# Patient Record
Sex: Male | Born: 1970 | Race: White | Hispanic: No | Smoking: Former smoker
Health system: Southern US, Community
[De-identification: ages and names within clinical notes are randomized; demographics above are authoritative.]

## PROBLEM LIST (undated history)

## (undated) DIAGNOSIS — I10 Essential (primary) hypertension: Secondary | ICD-10-CM

## (undated) DIAGNOSIS — Z8249 Family history of ischemic heart disease and other diseases of the circulatory system: Secondary | ICD-10-CM

## (undated) DIAGNOSIS — K219 Gastro-esophageal reflux disease without esophagitis: Secondary | ICD-10-CM

## (undated) DIAGNOSIS — I2510 Atherosclerotic heart disease of native coronary artery without angina pectoris: Secondary | ICD-10-CM

## (undated) DIAGNOSIS — E785 Hyperlipidemia, unspecified: Secondary | ICD-10-CM

## (undated) DIAGNOSIS — G4730 Sleep apnea, unspecified: Secondary | ICD-10-CM

## (undated) DIAGNOSIS — R7301 Impaired fasting glucose: Secondary | ICD-10-CM

## (undated) DIAGNOSIS — E782 Mixed hyperlipidemia: Secondary | ICD-10-CM

## (undated) DIAGNOSIS — B351 Tinea unguium: Secondary | ICD-10-CM

## (undated) DIAGNOSIS — F1911 Other psychoactive substance abuse, in remission: Secondary | ICD-10-CM

## (undated) DIAGNOSIS — N529 Male erectile dysfunction, unspecified: Secondary | ICD-10-CM

## (undated) HISTORY — DX: Mixed hyperlipidemia: E78.2

## (undated) HISTORY — DX: Male erectile dysfunction, unspecified: N52.9

## (undated) HISTORY — DX: Family history of ischemic heart disease and other diseases of the circulatory system: Z82.49

## (undated) HISTORY — DX: Sleep apnea, unspecified: G47.30

## (undated) HISTORY — DX: Gastro-esophageal reflux disease without esophagitis: K21.9

## (undated) HISTORY — DX: Impaired fasting glucose: R73.01

## (undated) HISTORY — DX: Essential (primary) hypertension: I10

## (undated) HISTORY — DX: Tinea unguium: B35.1

## (undated) HISTORY — DX: Hyperlipidemia, unspecified: E78.5

## (undated) HISTORY — PX: RHINOPLASTY: SUR1284

## (undated) HISTORY — DX: Atherosclerotic heart disease of native coronary artery without angina pectoris: I25.10

---

## 2003-03-29 DIAGNOSIS — I10 Essential (primary) hypertension: Secondary | ICD-10-CM

## 2003-03-29 HISTORY — DX: Essential (primary) hypertension: I10

## 2003-08-11 ENCOUNTER — Encounter: Admission: RE | Admit: 2003-08-11 | Discharge: 2003-08-11 | Payer: Self-pay | Admitting: General Practice

## 2003-11-11 ENCOUNTER — Ambulatory Visit (HOSPITAL_COMMUNITY): Admission: RE | Admit: 2003-11-11 | Discharge: 2003-11-11 | Payer: Self-pay | Admitting: Otolaryngology

## 2003-11-11 ENCOUNTER — Ambulatory Visit (HOSPITAL_BASED_OUTPATIENT_CLINIC_OR_DEPARTMENT_OTHER): Admission: RE | Admit: 2003-11-11 | Discharge: 2003-11-11 | Payer: Self-pay | Admitting: Otolaryngology

## 2006-09-07 ENCOUNTER — Emergency Department (HOSPITAL_COMMUNITY): Admission: EM | Admit: 2006-09-07 | Discharge: 2006-09-07 | Payer: Self-pay | Admitting: Emergency Medicine

## 2010-08-13 NOTE — Op Note (Signed)
NAME:  Donald Oliver, Donald Oliver                          ACCOUNT NO.:  [id number]   MEDICAL RECORD NO.:  [id number]                   PATIENT TYPE:  AMB   LOCATION:  [facility]                                 FACILITY:  [facility]   PHYSICIAN:  Joanna Puff, M.D.            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   POSTOPERATIVE DIAGNOSES:  Deviated septum and nasal pyramid deformities,  both secondary to trauma.   OPERATIONS:  1. Nasal septoplasty.  2. Open reduction of nasal fracture.   SURGEON:  Joanna Puff, M.D.   ANESTHESIA:  General endotracheal by Mr. Andrey Campanile, CRNA.   PROCEDURE:  Patient was brought to the operating room and placed in a supine  position.  After adequate general endotracheal anesthesia had been obtained,  the patient's head was draped in the usual manner.  Supplemental anesthesia  was given to the nose, blocking and infiltrating the nose with 1% Xylocaine  with Adrenaline and a topical application of 4% cocaine solution  intranasally.   After adequate time for the local to work well, bilateral intercartilaginous  incisions were made, and the upper lateral cartilages were exposed.  A small  returning edge of the upper lateral cartilage was trimmed.  The skin was  further undermined over the dorsum of the nose.  A rim incision was made,  and the lower lateral cartilage delivered.  Slight fullness of the lower  lateral cartilage was corrected with complete strip technique.  A button end  knife was then inserted through the intercartilaginous incision and brought  over the caudle end of the nose making a complete transfixion incision.  Bilateral mucoperichondrial and mucoperiosteal flaps were elevated.  Dissection was carried out through both sides of the cartilaginous and bony  septum.  This revealed a markedly deviated septum into the right airway,  consistent with both bony and cartilaginous deformity.  The cartilaginous  septum which had buckled off the spine and crest into the right airway was  then removed by means of sharp dissection.  A large bony spur of the  perpendicular plate of the ethmoid and vomer, was then removed by chisel and  rongeurs.  A widening of the nasal crest was also corrected with a 4-mm  chisel.  Out fracture of each inferior turbinate was carried out.  Attention  was then directed to the external pyramid.  The patient had a small fullness  along the dorsum, which had resulted from this trauma.  This consisted of  both osseous growth as well as cartilaginous fullness.  This corrected by  first using a bone rasp followed by a cartilage rasp to create more evenness  to the dorsum.  To realign the nose, a vestibular incision was made, and  lateral osteotomies were performed with guarded chisels.  Infracture of the  nasal bone was then carried out.  Inspection then revealed the septum and  pyramid to be in good alignment.  The transfixion incision was closed with a  base suture of 3-0 plain gut as well as interrupted sutures of 5-0 plain cat  gut.  The rim incisions were closed with interrupted sutures of 5-0 plain  cat gut.  Crushed cartilages were then placed over the dorsum of the nose.  An incision was made on the right side of the columella.  A pocket was  created, and a cartilaginous strut was placed in this area.  This incision  was closed with 5-0 plain catgut.  Intranasal splints were inserted and  sutured in place with 3-0 Ethilon.  An external splint was applied.  The  patient was then awakened, extubated, and returned to the recovery room for  reactivity prior to being discharged home.  He is scheduled to the office in  24 hours for routine followup.   Discharge medications include Cephalexin 250 1 q.i.d., Sterapred 5 mg dose   pack, and Mepergan Fortis 1 tablet q.3-4h. p.r.n. pain.   CONDITION ON DISCHARGE:  Good.   COMPLICATIONS:  None.                                               Joanna Puff, M.D.    LLP/MEDQ  D:  11/11/2003  T:  11/11/2003  Job:  [id number]

## 2012-07-02 ENCOUNTER — Emergency Department (HOSPITAL_COMMUNITY)
Admission: EM | Admit: 2012-07-02 | Discharge: 2012-07-02 | Disposition: A | Discharge status: Home / Self Care | Payer: Self-pay | Source: Home / Self Care | Attending: Family Medicine | Admitting: Family Medicine

## 2012-07-02 ENCOUNTER — Encounter (HOSPITAL_COMMUNITY): Payer: Self-pay

## 2012-07-02 DIAGNOSIS — F1290 Cannabis use, unspecified, uncomplicated: Secondary | ICD-10-CM

## 2012-07-02 DIAGNOSIS — I10 Essential (primary) hypertension: Secondary | ICD-10-CM

## 2012-07-02 DIAGNOSIS — F1521 Other stimulant dependence, in remission: Secondary | ICD-10-CM | POA: Insufficient documentation

## 2012-07-02 DIAGNOSIS — E782 Mixed hyperlipidemia: Secondary | ICD-10-CM | POA: Insufficient documentation

## 2012-07-02 DIAGNOSIS — F17200 Nicotine dependence, unspecified, uncomplicated: Secondary | ICD-10-CM | POA: Insufficient documentation

## 2012-07-02 DIAGNOSIS — E781 Pure hyperglyceridemia: Secondary | ICD-10-CM

## 2012-07-02 DIAGNOSIS — F1911 Other psychoactive substance abuse, in remission: Secondary | ICD-10-CM

## 2012-07-02 HISTORY — DX: Family history of ischemic heart disease and other diseases of the circulatory system: Z82.49

## 2012-07-02 HISTORY — DX: Essential (primary) hypertension: I10

## 2012-07-02 HISTORY — DX: Other psychoactive substance abuse, in remission: F19.11

## 2012-07-02 LAB — CBC
HCT: 46.2 % (ref 39.0–52.0)
Hemoglobin: 17.3 g/dL — ABNORMAL HIGH (ref 13.0–17.0)
MCH: 31.1 pg (ref 26.0–34.0)
MCHC: 37.4 g/dL — ABNORMAL HIGH (ref 30.0–36.0)
RDW: 13.3 % (ref 11.5–15.5)
WBC: 11.2 10*3/uL — ABNORMAL HIGH (ref 4.0–10.5)

## 2012-07-02 LAB — POCT I-STAT, CHEM 8
BUN: 10 mg/dL (ref 6–23)
Chloride: 104 mEq/L (ref 96–112)
Creatinine, Ser: 0.8 mg/dL (ref 0.50–1.35)
Glucose, Bld: 98 mg/dL (ref 70–99)
HCT: 51 % (ref 39.0–52.0)
Potassium: 3.8 mEq/L (ref 3.5–5.1)
Sodium: 140 mEq/L (ref 135–145)

## 2012-07-02 LAB — COMPREHENSIVE METABOLIC PANEL
ALT: 52 U/L (ref 0–53)
Albumin: 4.5 g/dL (ref 3.5–5.2)
Alkaline Phosphatase: 80 U/L (ref 39–117)
GFR calc non Af Amer: >90 mL/min (ref >90)
Glucose, Bld: 96 mg/dL (ref 70–99)
Total Bilirubin: 0.8 mg/dL (ref 0.3–1.2)
Total Protein: 8 g/dL (ref 6.0–8.3)

## 2012-07-02 LAB — LIPID PANEL: LDL Cholesterol: UNDETERMINED mg/dL (ref 0–99)

## 2012-07-02 MED ORDER — LISINOPRIL-HYDROCHLOROTHIAZIDE 10-12.5 MG PO TABS: 1.0000 | ORAL_TABLET | Freq: Every day | ORAL | Status: DC

## 2012-07-02 NOTE — ED Notes (Signed)
Patient is here for hypertension. 

## 2012-07-02 NOTE — ED Notes (Signed)
Patient states is having SOB Ringing in his ears Blurred vision

## 2012-07-02 NOTE — ED Provider Notes (Signed)
History    CSN: [id number]  Arrival date & time 07/02/12  1150   First MD Initiated Contact with Patient 07/02/12 1349     Chief Complaint  Patient presents with  . Hypertension   HPI Pt is reporting blurry vision, ringing in ears, headaches, shortness of breath and reports that he has been out of his insurance for many years.  He does not have a PCP.   Pt is a smoker.    History reviewed. No pertinent past medical history.  History reviewed. No pertinent past surgical history.  No family history on file.  History  Substance Use Topics  . Smoking status: Current everyday smoker   . Smokeless tobacco: Not on file  . Alcohol Use: Not on file     Review of Systems Constitutional: Negative.  HENT: Negative.  Respiratory: Negative.  Cardiovascular: Negative.  Gastrointestinal: Negative.  Endocrine: Negative.  Genitourinary: Negative.  Musculoskeletal: Negative.  Skin: Negative.  Allergic/Immunologic: Negative.  Neurological: Negative.  Hematological: Negative.  Psychiatric/Behavioral: Negative.  All other systems reviewed and are negative   Allergies  Amoxicillin  Home Medications  No current outpatient prescriptions on file.  BP 186/130  Pulse 91  Temp(Src) 98.1 F (36.7 C) (Oral)  SpO2 100%  Physical Exam Nursing note and vitals reviewed.  Constitutional: He is oriented to person, place, and time. He appears well-developed and well-nourished. No distress.  Eyes: Conjunctivae and EOM are normal. Pupils are equal, round, and reactive to light.  Neck: Normal range of motion. Neck supple. No JVD present. No thyromegaly present.  Cardiovascular: Normal rate, regular rhythm and normal heart sounds.  No murmur heard.  Pulmonary/Chest: Effort normal and breath sounds normal. No respiratory distress.  Abdominal: Soft. Bowel sounds are normal.  Musculoskeletal: Normal range of motion. He exhibits no edema.  Lymphadenopathy:  He has no cervical adenopathy.   Neurological: He is oriented to person, place, and time. Coordination normal.  Skin: Skin is warm and dry. No rash noted. No erythema. No pallor.  Psychiatric: He has a normal mood and affect. His behavior is normal. Judgment and thought content normal.   ED Course  Procedures (including critical care time)  Labs Reviewed - No data to display No results found.  No diagnosis found.  MDM  IMPRESSION  Hypertension, uncontrolled  Hypertriglyceridemia  History of amphetamine abuse  Current Marijuana use  RECOMMENDATIONS / PLAN Check labs today Start zestoretic 20/12.5 - take 1 po daily I STRONGLY RECOMMEND THAT YOU STOP SMOKING AND USING ALL TOBACCO / NICOTINE PRODUCTS.  If YOU ARE READY TO QUIT SMOKING PLEASE CALL smoking cessation specialist  [phone] The patient was counseled on the dangers of tobacco use, and was advised to quit.  Reviewed strategies to maximize success, including removing cigarettes and smoking materials from environment and stress management. Encouraged marijuana cessation  FOLLOW UP 2 weeks   The patient was given clear instructions to go to ER or return to medical center if symptoms don't improve, worsen or new problems develop.  The patient verbalized understanding.  The patient was told to call to get lab results if they haven't heard anything in the next week.             Cleora Fleet, MD 07/02/12 1921

## 2012-07-03 ENCOUNTER — Telehealth (HOSPITAL_COMMUNITY): Payer: Self-pay

## 2012-07-03 LAB — VITAMIN D 25 HYDROXY (VIT D DEFICIENCY, FRACTURES): Vit D, 25-Hydroxy: 51 ng/mL (ref 30–89)

## 2012-07-03 NOTE — ED Notes (Signed)
Spoke with patient lab results given To return tomorrow for fasting lipid blood draw

## 2012-07-03 NOTE — Progress Notes (Signed)
Quick Note:  Please notify patient that his labs reveal that his white blood cell count is a little elevated. His cholesterol and triglycerides are elevated. I'd like to check a fasting lipid panel before we start medications. Please have him come it to do a fasting lipid panel at his earliest convenience.   Rodney Langton, MD, CDE, FAAFP [facility] [place], [place]   ______

## 2012-07-04 ENCOUNTER — Emergency Department (INDEPENDENT_AMBULATORY_CARE_PROVIDER_SITE_OTHER)
Admission: EM | Admit: 2012-07-04 | Discharge: 2012-07-04 | Disposition: A | Discharge status: Home / Self Care | Payer: Self-pay | Source: Home / Self Care

## 2012-07-04 DIAGNOSIS — E781 Pure hyperglyceridemia: Secondary | ICD-10-CM

## 2012-07-04 DIAGNOSIS — I10 Essential (primary) hypertension: Secondary | ICD-10-CM

## 2012-07-04 LAB — LIPID PANEL
Cholesterol: 255 mg/dL — ABNORMAL HIGH (ref 0–200)
LDL Cholesterol: 145 mg/dL — ABNORMAL HIGH (ref 0–99)
Total CHOL/HDL Ratio: 7.1 RATIO
VLDL: 74 mg/dL — ABNORMAL HIGH (ref 0–40)

## 2012-07-04 NOTE — ED Notes (Signed)
Patient here for repeat fasting lipid panel

## 2012-07-05 NOTE — Progress Notes (Signed)
Quick Note:  Please inform patient that his cholesterol improved a little bit but is still very elevated. I think that he needs to start cholesterol medication. Please call and pravastatin 40 mg 1 by mouth daily taken at night before bed. Dispense #30, refill x3. I like to recheck his cholesterol and other labs in about 3 months.  Rodney Langton, MD, CDE, FAAFP [facility] [place], [place]   ______

## 2012-07-06 ENCOUNTER — Telehealth (HOSPITAL_COMMUNITY): Payer: Self-pay

## 2012-07-06 NOTE — ED Notes (Signed)
Patient returned our call Lab results given Pravastatin 40mg  was called into [facility] @ [place] [phone]

## 2012-07-16 ENCOUNTER — Encounter (HOSPITAL_COMMUNITY): Payer: Self-pay | Admitting: *Deleted

## 2012-07-16 ENCOUNTER — Emergency Department (HOSPITAL_COMMUNITY)
Admission: EM | Admit: 2012-07-16 | Discharge: 2012-07-16 | Disposition: A | Discharge status: Home / Self Care | Payer: Self-pay | Source: Home / Self Care

## 2012-07-16 DIAGNOSIS — F17200 Nicotine dependence, unspecified, uncomplicated: Secondary | ICD-10-CM

## 2012-07-16 DIAGNOSIS — I10 Essential (primary) hypertension: Secondary | ICD-10-CM

## 2012-07-16 DIAGNOSIS — E781 Pure hyperglyceridemia: Secondary | ICD-10-CM

## 2012-07-16 MED ORDER — LISINOPRIL-HYDROCHLOROTHIAZIDE 10-12.5 MG PO TABS: 2.0000 | ORAL_TABLET | Freq: Every day | ORAL | Status: DC

## 2012-07-16 NOTE — ED Notes (Signed)
Follow up with blood pressure.

## 2012-07-16 NOTE — ED Provider Notes (Signed)
History     CSN: [id number]  Arrival date & time 07/16/12  1030   First MD Initiated Contact with Patient 07/16/12 1121      Chief Complaint  Patient presents with  . Follow-up     HPI  42 year old male with active smoking, uncontrolled hypertension and significant dyslipidemia on recent labs was prescribed with antihypertensive and statin presents for a followup. On his previous visit he had symptoms of bloody vision and headaches which he denies at this time. He hasn't started taking his blood pressure medications on a regular basis and has been compliant. He also informs cutting down on smoking to three fourths of a pack from one pack per day. He still drinks 3 cups of coffee daily and has not been compliant with his diet.  Past Medical History  Diagnosis Date  . FH: premature coronary heart disease     Father had MI at age 40   . Hypertension   . History of substance abuse     Pt had abused amphetamines : quit in 2005  . Smoker     Past Surgical History  Procedure Laterality Date  . Rhinoplasty      2006 done at [facility] History  Problem Relation Age of Onset  . Heart failure Father     History  Substance Use Topics  . Smoking status: Current Every Day Smoker  . Smokeless tobacco: Never Used  . Alcohol Use: No      Review of Systems  Allergies  Amoxicillin  Home Medications   Current Outpatient Rx  Name  Route  Sig  Dispense  Refill  . lisinopril-hydrochlorothiazide (ZESTORETIC) 10-12.5 MG per tablet   Oral   Take 1 tablet by mouth daily.   30 tablet   2     BP 146/111  Pulse 91  Temp(Src) 98.4 F (36.9 C) (Oral)  Resp 18  SpO2 99%  Physical Exam Middle aged man in no acute distress HEENT: No pallor, moist oral mucosa Chest: Clear to auscultation bilaterally CVS: Normal S1 and S2 Abdomen: Soft, nontender, bowel sounds present, any wart over the infrapubic area, nontender, Extremities: Warm, no edema CNS: AAOX 3  ED  Course  Procedures (including critical care time)  Labs Reviewed - No data to display No results found.   No diagnosis found.  Assessment/ Plan Uncontrolled hypertension Blood Pressure still elevated at 140 /111 mmHg. Patient has started taking his blood pressure medications and slightly better than his previous meetings. I would increase his lisinopril-HCTZ to 2 times a day. I have advised him monitoring his blood pressure medications possibly every other day. He needs to followup in the clinic in one week to have his blood pressure evaluated. Counseled strongly on smoking cessation, diet control and regular exercise to reduce weight. Counseled on reducing intake of caffeinated beverages, and canned/ processed foods with high sodium and cholesterol content.  Dyslipidemia  Started on statin which has been tolerating well. Counseled on balanced healthy diet intake and avoiding fatty foods.     MDM   Followup in one week for blood pressure monitoring.        Eddie North, MD 07/16/12 1147

## 2012-07-16 NOTE — ED Notes (Signed)
Patient presents for follow-up of hypertension.

## 2012-07-24 ENCOUNTER — Encounter (HOSPITAL_COMMUNITY): Payer: Self-pay

## 2012-07-24 ENCOUNTER — Emergency Department (INDEPENDENT_AMBULATORY_CARE_PROVIDER_SITE_OTHER)
Admission: EM | Admit: 2012-07-24 | Discharge: 2012-07-24 | Disposition: A | Discharge status: Home Health | Payer: Self-pay | Source: Home / Self Care

## 2012-07-24 DIAGNOSIS — I10 Essential (primary) hypertension: Secondary | ICD-10-CM

## 2012-07-24 DIAGNOSIS — E781 Pure hyperglyceridemia: Secondary | ICD-10-CM

## 2012-07-24 MED ORDER — ASPIRIN 81 MG PO TBEC: 81.0000 mg | DELAYED_RELEASE_TABLET | Freq: Every day | ORAL | Status: DC

## 2012-07-24 MED ORDER — LISINOPRIL-HYDROCHLOROTHIAZIDE 20-25 MG PO TABS: 1.0000 | ORAL_TABLET | Freq: Every day | ORAL | Status: DC

## 2012-07-24 MED ORDER — NICOTINE 21 MG/24HR TD PT24: 1.0000 | MEDICATED_PATCH | TRANSDERMAL | Status: DC

## 2012-07-24 NOTE — ED Notes (Signed)
Patient has a history of HTN Here for follow up

## 2012-07-24 NOTE — ED Provider Notes (Signed)
History     CSN: [id number]  Arrival date & time 07/24/12  1068  42 year old male with uncontrolled blood pressure, hypertriglyceridemia, nicotine dependence, who presents for a followup of blood pressure. The patient states that his blood pressure at home has been in the 140s systolic. He has cut down on smoking. He is also trying to lose weight. And exercise more. He has been taking a baby aspirin for at least 3 or 4 years.    Chief Complaint  Patient presents with  . Hypertension      HPI  Past Medical History  Diagnosis Date  . FH: premature coronary heart disease     Father had MI at age 30   . Hypertension   . History of substance abuse     Pt had abused amphetamines : quit in 2005  . Smoker     Past Surgical History  Procedure Laterality Date  . Rhinoplasty      2006 done at [facility] History  Problem Relation Age of Onset  . Heart failure Father     History  Substance Use Topics  . Smoking status: Current Every Day Smoker  . Smokeless tobacco: Never Used  . Alcohol Use: No      Review of Systems As in history of present illness  Allergies  Amoxicillin  Home Medications   Current Outpatient Rx  Name  Route  Sig  Dispense  Refill  . pravastatin (PRAVACHOL) 20 MG tablet   Oral   Take 20 mg by mouth daily.         Marland Kitchen aspirin (ASPIRIN EC) 81 MG EC tablet   Oral   Take 1 tablet (81 mg total) by mouth daily. Swallow whole.   30 tablet   12   . lisinopril-hydrochlorothiazide (PRINZIDE,ZESTORETIC) 20-25 MG per tablet   Oral   Take 1 tablet by mouth daily.   30 tablet   3     BP 142/96  Pulse 95  Temp(Src) 97.7 F (36.5 C) (Oral)  Resp 16  SpO2 100%  Physical Exam Middle aged man in no acute distress  HEENT: No pallor, moist oral mucosa  Chest: Clear to auscultation bilaterally  CVS: Normal S1 and S2  Abdomen: Soft, nontender, bowel sounds present, any wart over the infrapubic area, nontender,  Extremities: Warm, no  edema  CNS: AAOX 3   ED Course  Procedures (including critical care time)  Labs Reviewed - No data to display No results found.   1. Uncontrolled hypertension patient has requested change in his Zestoretic combination to a higher dose so that he can take it once a day   2. Hypertriglyceridemia he was recently started on pravastatin, we'll check a lipid panel in 2 months prior to his next appointment    Followup in 2 months   MDM           Richarda Overlie, MD 07/24/12 1147

## 2012-10-26 ENCOUNTER — Encounter: Payer: Self-pay | Admitting: Family Medicine

## 2012-10-26 ENCOUNTER — Ambulatory Visit: Payer: Self-pay | Attending: Family Medicine | Admitting: Family Medicine

## 2012-10-26 VITALS — BP 137/95 | HR 62 | Temp 97.9°F | Resp 16 | Wt 177.6 lb

## 2012-10-26 DIAGNOSIS — E785 Hyperlipidemia, unspecified: Secondary | ICD-10-CM

## 2012-10-26 DIAGNOSIS — S92919A Unspecified fracture of unspecified toe(s), initial encounter for closed fracture: Secondary | ICD-10-CM | POA: Insufficient documentation

## 2012-10-26 DIAGNOSIS — S92911A Unspecified fracture of right toe(s), initial encounter for closed fracture: Secondary | ICD-10-CM

## 2012-10-26 DIAGNOSIS — A630 Anogenital (venereal) warts: Secondary | ICD-10-CM | POA: Insufficient documentation

## 2012-10-26 DIAGNOSIS — I10 Essential (primary) hypertension: Secondary | ICD-10-CM | POA: Insufficient documentation

## 2012-10-26 LAB — COMPLETE METABOLIC PANEL WITH GFR
Albumin: 4.7 g/dL (ref 3.5–5.2)
Calcium: 9.4 mg/dL (ref 8.4–10.5)
Chloride: 102 mEq/L (ref 96–112)
GFR, Est African American: >89 mL/min
Glucose, Bld: 86 mg/dL (ref 70–99)
Sodium: 136 mEq/L (ref 135–145)
Total Bilirubin: 0.9 mg/dL (ref 0.3–1.2)

## 2012-10-26 LAB — LIPID PANEL: Cholesterol: 237 mg/dL — ABNORMAL HIGH (ref 0–200)

## 2012-10-26 MED ORDER — LISINOPRIL-HYDROCHLOROTHIAZIDE 20-25 MG PO TABS: 1.0000 | ORAL_TABLET | Freq: Every day | ORAL | Status: DC

## 2012-10-26 MED ORDER — LOVASTATIN 20 MG PO TABS: 20.0000 mg | ORAL_TABLET | Freq: Every day | ORAL | Status: DC

## 2012-10-26 NOTE — Progress Notes (Signed)
Patient ID: Donald Oliver, male   DOB: 10-Dec-1970, 42 y.o.   MRN: [id number]  CC:  Chief Complaint  Patient presents with  . Follow-up     HPI: Pt reports that he is doing well with his blood pressure medications.  Pt says that he is tolerating meds.  He has noticed a big improvement in his blood pressure.  Pt reports that he has had pain in a 4th toe on right foot.  Pt also reports that he is concerned about genital wart lesion that has gotten bigger over past year that he would like to have treated.     Allergies  Allergen Reactions  . Amoxicillin    Past Medical History  Diagnosis Date  . FH: premature coronary heart disease     Father had MI at age 31   . Hypertension   . History of substance abuse     Pt had abused amphetamines : quit in 2005  . Smoker    Current Outpatient Prescriptions on File Prior to Visit  Medication Sig Dispense Refill  . aspirin (ASPIRIN EC) 81 MG EC tablet Take 1 tablet (81 mg total) by mouth daily. Swallow whole.  30 tablet  12  . lisinopril-hydrochlorothiazide (PRINZIDE,ZESTORETIC) 20-25 MG per tablet Take 1 tablet by mouth daily.  30 tablet  3  . nicotine (NICODERM CQ - DOSED IN MG/24 HOURS) 21 mg/24hr patch Place 1 patch onto the skin daily.  28 patch  0  . pravastatin (PRAVACHOL) 20 MG tablet Take 20 mg by mouth daily.       No current facility-administered medications on file prior to visit.   Family History  Problem Relation Age of Onset  . Heart failure Father    History   Social History  . Marital Status: Legally Separated    Spouse Name: N/A    Number of Children: 3  . Years of Education: 12    Occupational History  . Industrial/product designer     unemployed    Social History Main Topics  . Smoking status: Current Every Day Smoker  . Smokeless tobacco: Never Used  . Alcohol Use: No  . Drug Use: 1.00 per week    Special: Marijuana  . Sexually Active: Yes   Other Topics Concern  . Not on file   Social History Narrative   . No narrative on file    Review of Systems  Constitutional: Negative for fever, chills, diaphoresis, activity change, appetite change and fatigue.  HENT: Negative for ear pain, nosebleeds, congestion, facial swelling, rhinorrhea, neck pain, neck stiffness and ear discharge.   Eyes: Negative for pain, discharge, redness, itching and visual disturbance.  Respiratory: Negative for cough, choking, chest tightness, shortness of breath, wheezing and stridor.   Cardiovascular: Negative for chest pain, palpitations and leg swelling.  Gastrointestinal: Negative for abdominal distention.  Genitourinary: Negative for dysuria, urgency, frequency, hematuria, flank pain, decreased urine volume, difficulty urinating and dyspareunia.  Musculoskeletal: Negative for back pain, joint swelling, arthralgias and gait problem.  Neurological: Negative for dizziness, tremors, seizures, syncope, facial asymmetry, speech difficulty, weakness, light-headedness, numbness and headaches.  Hematological: Negative for adenopathy. Does not bruise/bleed easily.  Psychiatric/Behavioral: Negative for hallucinations, behavioral problems, confusion, dysphoric mood, decreased concentration and agitation.    Objective:   Filed Vitals:   10/26/12 1114  BP: 137/95  Pulse: 62  Temp: 97.9 F (36.6 C)  Resp: 16    Physical Exam  Constitutional: Appears well-developed and well-nourished. No distress.  HENT: Normocephalic.  External right and left ear normal. Oropharynx is clear and moist.  Eyes: Conjunctivae and EOM are normal. PERRLA, no scleral icterus.  Neck: Normal ROM. Neck supple. No JVD. No tracheal deviation. No thyromegaly.  CVS: RRR, S1/S2 +, no murmurs, no gallops, no carotid bruit.  Pulmonary: Effort and breath sounds normal, no stridor, rhonchi, wheezes, rales.  Abdominal: Soft. BS +,  no distension, tenderness, rebound or guarding.  Musculoskeletal: Normal range of motion.right foot edema and pain on lateral  edge.  Lymphadenopathy: No lymphadenopathy noted, cervical, inguinal. Neuro: Alert. Normal reflexes, muscle tone coordination. No cranial nerve deficit. Skin: Skin is warm and dry. No rash noted. Not diaphoretic. No erythema. No pallor.  Psychiatric: Normal mood and affect. Behavior, judgment, thought content normal.   Lab Results  Component Value Date   WBC 11.2* 07/02/2012   HGB 17.3* 07/02/2012   HCT 51.0 07/02/2012   MCV 82.9 07/02/2012   PLT 350 07/02/2012   Lab Results  Component Value Date   CREATININE 0.80 07/02/2012   BUN 10 07/02/2012   NA 140 07/02/2012   K 3.8 07/02/2012   CL 104 07/02/2012   CO2 26 07/02/2012    No results found for this basename: HGBA1C   Lipid Panel     Component Value Date/Time   CHOL 255* 07/04/2012 1034   TRIG 372* 07/04/2012 1034   HDL 36* 07/04/2012 1034   CHOLHDL 7.1 07/04/2012 1034   VLDL 74* 07/04/2012 1034   LDLCALC 145* 07/04/2012 1034       Assessment and plan:   Patient Active Problem List   Diagnosis Date Noted  . Uncontrolled hypertension 07/02/2012  . Hypertriglyceridemia 07/02/2012  . Smoker 07/02/2012  . History of substance abuse 07/02/2012  . Marijuana smoker 07/02/2012  . History of amphetamine dependence/abuse 07/02/2012   Genital Warts   Family medicine skin clinic referral  Refill medications.   Xray right foot to rule out fracture   Cryotherapy applied to genital wart lesion with patient's permission: Pt tolerated the procedure well.  He was given counseling and instructions on what to expect from procedure and expected course of healing.  Pt may return to have lesion treated again.  The patient verbalized understanding.    The patient was counseled on the dangers of tobacco use, and was advised to quit.  Reviewed strategies to maximize success, including removing cigarettes and smoking materials from environment and stress management.  RTC in 4 months   The patient was given clear instructions to go to ER or return to medical  center if symptoms don't improve, worsen or new problems develop.  The patient verbalized understanding.  The patient was told to call to get any lab results if not heard anything in the next week.    Rodney Langton, MD, CDE, FAAFP [facility] [place], [place]   Results for orders placed in visit on 10/26/12  COMPLETE METABOLIC PANEL WITH GFR      Result Value Range   Sodium 136  135 - 145 mEq/L   Potassium 3.8  3.5 - 5.3 mEq/L   Chloride 102  96 - 112 mEq/L   CO2 25  19 - 32 mEq/L   Glucose, Bld 86  70 - 99 mg/dL   BUN 16  6 - 23 mg/dL   Creat 1.61  0.96 - 0.45 mg/dL   Total Bilirubin 0.9  0.3 - 1.2 mg/dL   Alkaline Phosphatase 63  39 - 117 U/L   AST 22  0 - 37 U/L   ALT 35  0 - 53 U/L   Total Protein 7.3  6.0 - 8.3 g/dL   Albumin 4.7  3.5 - 5.2 g/dL   Calcium 9.4  8.4 - 84.1 mg/dL   GFR, Est African American >89     GFR, Est Non African American >89    LIPID PANEL      Result Value Range   Cholesterol 237 (*) 0 - 200 mg/dL   Triglycerides 324 (*) <150 mg/dL   HDL 38 (*) >40 mg/dL   Total CHOL/HDL Ratio 6.2     VLDL NOT CALC  0 - 40 mg/dL   LDL Cholesterol

## 2012-10-26 NOTE — Patient Instructions (Addendum)
Hypertension As your heart beats, it forces blood through your arteries. This force is your blood pressure. If the pressure is too high, it is called hypertension (HTN) or high blood pressure. HTN is dangerous because you may have it and not know it. High blood pressure may mean that your heart has to work harder to pump blood. Your arteries may be narrow or stiff. The extra work puts you at risk for heart disease, stroke, and other problems.  Blood pressure consists of two numbers, a higher number over a lower, 110/72, for example. It is stated as "110 over 72." The ideal is below 120 for the top number (systolic) and under 80 for the bottom (diastolic). Write down your blood pressure today. You should pay close attention to your blood pressure if you have certain conditions such as:  Heart failure.  Prior heart attack.  Diabetes  Chronic kidney disease.  Prior stroke.  Multiple risk factors for heart disease. To see if you have HTN, your blood pressure should be measured while you are seated with your arm held at the level of the heart. It should be measured at least twice. A one-time elevated blood pressure reading (especially in the Emergency Department) does not mean that you need treatment. There may be conditions in which the blood pressure is different between your right and left arms. It is important to see your caregiver soon for a recheck. Most people have essential hypertension which means that there is not a specific cause. This type of high blood pressure may be lowered by changing lifestyle factors such as:  Stress.  Smoking.  Lack of exercise.  Excessive weight.  Drug/tobacco/alcohol use.  Eating less salt. Most people do not have symptoms from high blood pressure until it has caused damage to the body. Effective treatment can often prevent, delay or reduce that damage. TREATMENT  When a cause has been identified, treatment for high blood pressure is directed at the  cause. There are a large number of medications to treat HTN. These fall into several categories, and your caregiver will help you select the medicines that are best for you. Medications may have side effects. You should review side effects with your caregiver. If your blood pressure stays high after you have made lifestyle changes or started on medicines,   Your medication(s) may need to be changed.  Other problems may need to be addressed.  Be certain you understand your prescriptions, and know how and when to take your medicine.  Be sure to follow up with your caregiver within the time frame advised (usually within two weeks) to have your blood pressure rechecked and to review your medications.  If you are taking more than one medicine to lower your blood pressure, make sure you know how and at what times they should be taken. Taking two medicines at the same time can result in blood pressure that is too low. SEEK IMMEDIATE MEDICAL CARE IF:  You develop a severe headache, blurred or changing vision, or confusion.  You have unusual weakness or numbness, or a faint feeling.  You have severe chest or abdominal pain, vomiting, or breathing problems. MAKE SURE YOU:   Understand these instructions.  Will watch your condition.  Will get help right away if you are not doing well or get worse. Document Released: 03/14/2005 Document Revised: 06/06/2011 Document Reviewed: 11/02/2007 [facility] Patient Information 2014 [place]. Managing Your High Blood Pressure Blood pressure is a measurement of how forceful your blood is  pressing against the walls of the arteries. Arteries are muscular tubes within the circulatory system. Blood pressure does not stay the same. Blood pressure rises when you are active, excited, or nervous; and it lowers during sleep and relaxation. If the numbers measuring your blood pressure stay above normal most of the time, you are at risk for health problems. High blood  pressure (hypertension) is a long-term (chronic) condition in which blood pressure is elevated. A blood pressure reading is recorded as two numbers, such as 120 over 80 (or 120/80). The first, higher number is called the systolic pressure. It is a measure of the pressure in your arteries as the heart beats. The second, lower number is called the diastolic pressure. It is a measure of the pressure in your arteries as the heart relaxes between beats.  Keeping your blood pressure in a normal range is important to your overall health and prevention of health problems, such as heart disease and stroke. When your blood pressure is uncontrolled, your heart has to work harder than normal. High blood pressure is a very common condition in adults because blood pressure tends to rise with age. Men and women are equally likely to have hypertension but at different times in life. Before age 27, men are more likely to have hypertension. After 43 years of age, women are more likely to have it. Hypertension is especially common in African Americans. This condition often has no signs or symptoms. The cause of the condition is usually not known. Your caregiver can help you come up with a plan to keep your blood pressure in a normal, healthy range. BLOOD PRESSURE STAGES Blood pressure is classified into four stages: normal, prehypertension, stage 1, and stage 2. Your blood pressure reading will be used to determine what type of treatment, if any, is necessary. Appropriate treatment options are tied to these four stages:  Normal  Systolic pressure (mm Hg): below 120.  Diastolic pressure (mm Hg): below 80. Prehypertension  Systolic pressure (mm Hg): 120 to 139.  Diastolic pressure (mm Hg): 80 to 89. Stage1  Systolic pressure (mm Hg): 140 to 159.  Diastolic pressure (mm Hg): 90 to 99. Stage2  Systolic pressure (mm Hg): 160 or above.  Diastolic pressure (mm Hg): 100 or above. RISKS RELATED TO HIGH BLOOD  PRESSURE Managing your blood pressure is an important responsibility. Uncontrolled high blood pressure can lead to:  A heart attack.  A stroke.  A weakened blood vessel (aneurysm).  Heart failure.  Kidney damage.  Eye damage.  Metabolic syndrome.  Memory and concentration problems. HOW TO MANAGE YOUR BLOOD PRESSURE Blood pressure can be managed effectively with lifestyle changes and medicines (if needed). Your caregiver will help you come up with a plan to bring your blood pressure within a normal range. Your plan should include the following: Education  Read all information provided by your caregivers about how to control blood pressure.  Educate yourself on the latest guidelines and treatment recommendations. New research is always being done to further define the risks and treatments for high blood pressure. Lifestylechanges  Control your weight.  Avoid smoking.  Stay physically active.  Reduce the amount of salt in your diet.  Reduce stress.  Control any chronic conditions, such as high cholesterol or diabetes.  Reduce your alcohol intake. Medicines  Several medicines (antihypertensive medicines) are available, if needed, to bring blood pressure within a normal range. Communication  Review all the medicines you take with your caregiver because there may be  side effects or interactions.  Talk with your caregiver about your diet, exercise habits, and other lifestyle factors that may be contributing to high blood pressure.  See your caregiver regularly. Your caregiver can help you create and adjust your plan for managing high blood pressure. RECOMMENDATIONS FOR TREATMENT AND FOLLOW-UP  The following recommendations are based on current guidelines for managing high blood pressure in nonpregnant adults. Use these recommendations to identify the proper follow-up period or treatment option based on your blood pressure reading. You can discuss these options with your  caregiver.  Systolic pressure of 120 to 139 or diastolic pressure of 80 to 89: Follow up with your caregiver as directed.  Systolic pressure of 140 to 160 or diastolic pressure of 90 to 100: Follow up with your caregiver within 2 months.  Systolic pressure above 160 or diastolic pressure above 100: Follow up with your caregiver within 1 month.  Systolic pressure above 180 or diastolic pressure above 110: Consider antihypertensive therapy; follow up with your caregiver within 1 week.  Systolic pressure above 200 or diastolic pressure above 120: Begin antihypertensive therapy; follow up with your caregiver within 1 week. Document Released: 12/07/2011 Document Reviewed: 12/07/2011 [facility] Patient Information 2014 [place], [place]. Cryotherapy Cryotherapy is when you put ice on your injury. Ice helps lessen pain and puffiness (swelling) after an injury. Ice works the best when you start using it in the first 24 to 48 hours after an injury. HOME CARE  Put a dry or damp towel between the ice pack and your skin.  You may press gently on the ice pack.  Leave the ice on for no more than 10 to 20 minutes at a time.  Check your skin after 5 minutes to make sure your skin is okay.  Rest at least 20 minutes between ice pack uses.  Stop using ice when your skin loses feeling (numbness).  Do not use ice on someone who cannot tell you when it hurts. This includes small children and people with memory problems (dementia). GET HELP RIGHT AWAY IF:  You have white spots on your skin.  Your skin turns blue or pale.  Your skin feels waxy or hard.  Your puffiness gets worse. MAKE SURE YOU:   Understand these instructions.  Will watch your condition.  Will get help right away if you are not doing well or get worse. Document Released: 08/31/2007 Document Revised: 06/06/2011 Document Reviewed: 11/04/2010 [facility] Patient Information 2014 [place], [place].

## 2012-10-26 NOTE — Progress Notes (Signed)
Patient here for follow up on his B/P Has an issue with toe on right foot Has genital warts and would like something prescribed for that

## 2012-10-28 DIAGNOSIS — A630 Anogenital (venereal) warts: Secondary | ICD-10-CM | POA: Insufficient documentation

## 2012-10-28 NOTE — Progress Notes (Signed)
Quick Note:  Please inform patient that metabolic panel came back OK. His cholesterol numbers are improving but still elevated. We should check a fasting lipid panel in 3 months and may need to increase the dose of the cholesterol medication. Also, work on low fat low cholesterol diet and regular exercise at least 5x per week.  Rodney Langton, MD, CDE, FAAFP [facility] [place], [place]   ______

## 2012-10-29 ENCOUNTER — Telehealth: Payer: Self-pay | Admitting: *Deleted

## 2012-10-29 NOTE — Telephone Encounter (Signed)
10/29/12 Spoke with patient and made aware of lab results metabolic panel came back OK. Cholesterol improving . Recheck in 3 months  Patient will call back and schedule appointment also start low fat low cholesterol diet along with exercising. P.Fergie Sherbert,RN BSN [facility]

## 2013-02-25 ENCOUNTER — Ambulatory Visit: Payer: Self-pay

## 2013-02-27 ENCOUNTER — Ambulatory Visit: Payer: Self-pay | Attending: Internal Medicine | Admitting: Internal Medicine

## 2013-02-27 ENCOUNTER — Encounter: Payer: Self-pay | Admitting: Internal Medicine

## 2013-02-27 VITALS — BP 138/96 | HR 74 | Temp 98.6°F | Resp 14 | Ht 69.0 in | Wt 177.8 lb

## 2013-02-27 DIAGNOSIS — I10 Essential (primary) hypertension: Secondary | ICD-10-CM | POA: Insufficient documentation

## 2013-02-27 DIAGNOSIS — E781 Pure hyperglyceridemia: Secondary | ICD-10-CM | POA: Insufficient documentation

## 2013-02-27 DIAGNOSIS — F17200 Nicotine dependence, unspecified, uncomplicated: Secondary | ICD-10-CM | POA: Insufficient documentation

## 2013-02-27 LAB — COMPLETE METABOLIC PANEL WITH GFR
Albumin: 4.5 g/dL (ref 3.5–5.2)
Alkaline Phosphatase: 58 U/L (ref 39–117)
CO2: 26 mEq/L (ref 19–32)
Chloride: 102 mEq/L (ref 96–112)
GFR, Est Non African American: >89 mL/min
Glucose, Bld: 103 mg/dL — ABNORMAL HIGH (ref 70–99)
Total Protein: 7.2 g/dL (ref 6.0–8.3)

## 2013-02-27 LAB — LIPID PANEL
Cholesterol: 243 mg/dL — ABNORMAL HIGH (ref 0–200)
LDL Cholesterol: 155 mg/dL — ABNORMAL HIGH (ref 0–99)
Total CHOL/HDL Ratio: 5.3 Ratio
Triglycerides: 211 mg/dL — ABNORMAL HIGH (ref <150)
VLDL: 42 mg/dL — ABNORMAL HIGH (ref 0–40)

## 2013-02-27 LAB — TSH: TSH: 1.366 u[IU]/mL (ref 0.350–4.500)

## 2013-02-27 MED ORDER — VARENICLINE TARTRATE 1 MG PO TABS: 1.0000 mg | ORAL_TABLET | Freq: Two times a day (BID) | ORAL | Status: AC

## 2013-02-27 MED ORDER — GEMFIBROZIL 600 MG PO TABS: 600.0000 mg | ORAL_TABLET | Freq: Two times a day (BID) | ORAL | Status: DC

## 2013-02-27 NOTE — Progress Notes (Signed)
Patient ID: Donald Oliver, male   DOB: 05-03-1970, 42 y.o.   MRN: [id number]   CC:  HPI: 42 year old male with a history of hypertension, hypertriglyceridemia, nicotine dependence who's here for a blood pressure check and to receive refills on his anti-hypertensive medication. Unfortunately he continues to smoke, he also has a positive family history of father will have sudden cardiac death at the age of 68.  He denies any shortness of breath but does complain of intermittent chest pain which is chronic apparently he has had it for 3 or 4 years it is always under his rib cage bilaterally. He feels that this is musculoskeletal pain. No associated symptoms of dizziness.  He has never had a stress test.  He states that he is in between jobs and is anticipated to receive health insurance soon      Allergies  Allergen Reactions  . Amoxicillin    Past Medical History  Diagnosis Date  . FH: premature coronary heart disease     Father had MI at age 32   . Hypertension   . History of substance abuse     Pt had abused amphetamines : quit in 2005  . Smoker    Current Outpatient Prescriptions on File Prior to Visit  Medication Sig Dispense Refill  . aspirin (ASPIRIN EC) 81 MG EC tablet Take 1 tablet (81 mg total) by mouth daily. Swallow whole.  30 tablet  12  . lisinopril-hydrochlorothiazide (PRINZIDE,ZESTORETIC) 20-25 MG per tablet Take 1 tablet by mouth daily.  30 tablet  4  . lovastatin (MEVACOR) 20 MG tablet Take 1 tablet (20 mg total) by mouth at bedtime.  30 tablet  3  . nicotine (NICODERM CQ - DOSED IN MG/24 HOURS) 21 mg/24hr patch Place 1 patch onto the skin daily.  28 patch  0   No current facility-administered medications on file prior to visit.   Family History  Problem Relation Age of Onset  . Heart failure Father    History   Social History  . Marital Status: Legally Separated    Spouse Name: N/A    Number of Children: 3  . Years of Education: 12    Occupational  History  . Industrial/product designer     unemployed    Social History Main Topics  . Smoking status: Current Every Day Smoker  . Smokeless tobacco: Never Used  . Alcohol Use: No  . Drug Use: 1.00 per week    Special: Marijuana  . Sexual Activity: Yes   Other Topics Concern  . Not on file   Social History Narrative  . No narrative on file    Review of Systems  Constitutional: Negative for fever, chills, diaphoresis, activity change, appetite change and fatigue.  HENT: Negative for ear pain, nosebleeds, congestion, facial swelling, rhinorrhea, neck pain, neck stiffness and ear discharge.   Eyes: Negative for pain, discharge, redness, itching and visual disturbance.  Respiratory: Negative for cough, choking, chest tightness, shortness of breath, wheezing and stridor.   Cardiovascular: Negative for chest pain, palpitations and leg swelling.  Gastrointestinal: Negative for abdominal distention.  Genitourinary: Negative for dysuria, urgency, frequency, hematuria, flank pain, decreased urine volume, difficulty urinating and dyspareunia.  Musculoskeletal: Negative for back pain, joint swelling, arthralgias and gait problem.  Neurological: Negative for dizziness, tremors, seizures, syncope, facial asymmetry, speech difficulty, weakness, light-headedness, numbness and headaches.  Hematological: Negative for adenopathy. Does not bruise/bleed easily.  Psychiatric/Behavioral: Negative for hallucinations, behavioral problems, confusion, dysphoric mood, decreased concentration and agitation.  Objective:   Filed Vitals:   02/27/13 0908  BP: 138/96  Pulse: 74  Temp: 98.6 F (37 C)  Resp: 14    Physical Exam  Constitutional: Appears well-developed and well-nourished. No distress.  HENT: Normocephalic. External right and left ear normal. Oropharynx is clear and moist.  Eyes: Conjunctivae and EOM are normal. PERRLA, no scleral icterus.  Neck: Normal ROM. Neck supple. No JVD. No  tracheal deviation. No thyromegaly.  CVS: RRR, S1/S2 +, no murmurs, no gallops, no carotid bruit.  Pulmonary: Effort and breath sounds normal, no stridor, rhonchi, wheezes, rales.  Abdominal: Soft. BS +,  no distension, tenderness, rebound or guarding.  Musculoskeletal: Normal range of motion. No edema and no tenderness.  Lymphadenopathy: No lymphadenopathy noted, cervical, inguinal. Neuro: Alert. Normal reflexes, muscle tone coordination. No cranial nerve deficit. Skin: Skin is warm and dry. No rash noted. Not diaphoretic. No erythema. No pallor.  Psychiatric: Normal mood and affect. Behavior, judgment, thought content normal.   Lab Results  Component Value Date   WBC 11.2* 07/02/2012   HGB 17.3* 07/02/2012   HCT 51.0 07/02/2012   MCV 82.9 07/02/2012   PLT 350 07/02/2012   Lab Results  Component Value Date   CREATININE 0.78 10/26/2012   BUN 16 10/26/2012   NA 136 10/26/2012   K 3.8 10/26/2012   CL 102 10/26/2012   CO2 25 10/26/2012    No results found for this basename: HGBA1C   Lipid Panel     Component Value Date/Time   CHOL 237* 10/26/2012 1146   TRIG 422* 10/26/2012 1146   HDL 38* 10/26/2012 1146   CHOLHDL 6.2 10/26/2012 1146   VLDL NOT CALC 10/26/2012 1146   LDLCALC Comment:   Not calculated due to Triglyceride >400. Suggest ordering Direct LDL (Unit Code: 21308).   Total Cholesterol/HDL Ratio:CHD Risk                        Coronary Heart Disease Risk Table                                        Men       Women          1/2 Average Risk              3.4        3.3              Average Risk              5.0        4.4           2X Average Risk              9.6        7.1           3X Average Risk             23.4       11.0 Use the calculated Patient Ratio above and the CHD Risk table  to determine the patient's CHD Risk. ATP III Classification (LDL):       < 100        mg/dL         Optimal      657 - 129     mg/dL         Near or Above  Optimal      130 - 159     mg/dL         Borderline High      160 -  189     mg/dL         High       > 409        mg/dL         Very High   10/26/1912 1146       Assessment and plan:   Patient Active Problem List   Diagnosis Date Noted  . Genital warts 10/28/2012  . Toe fracture 10/26/2012  . Unspecified essential hypertension 10/26/2012  . Dyslipidemia 10/26/2012  . Uncontrolled hypertension 07/02/2012  . Hypertriglyceridemia 07/02/2012  . Smoker 07/02/2012  . History of substance abuse 07/02/2012  . Marijuana smoker 07/02/2012  . 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 The patient verbalized understanding. The patient was told to call to get any lab results if not heard anything in the next week.

## 2013-02-27 NOTE — Progress Notes (Signed)
Pt is here for a f/u for hypertension check and medication refill. BP is 138/96.

## 2013-02-28 LAB — URINALYSIS
Hgb urine dipstick: NEGATIVE
Leukocytes, UA: NEGATIVE
Nitrite: NEGATIVE
Protein, ur: NEGATIVE mg/dL

## 2013-03-15 ENCOUNTER — Telehealth: Payer: Self-pay | Admitting: Internal Medicine

## 2013-03-15 NOTE — Telephone Encounter (Signed)
Pt called to get refills on scripts lovastatin (MEVACOR) 20 MG tablet & lisinopril-hydrochlorothiazide (PRINZIDE,ZESTORETIC) 20-25 MG per tablet; Please refill scripts to [facility] on [address]

## 2013-03-25 ENCOUNTER — Other Ambulatory Visit: Payer: Self-pay | Admitting: Family Medicine

## 2013-04-17 NOTE — Telephone Encounter (Signed)
Please refill, thank you!

## 2013-04-19 MED ORDER — LISINOPRIL-HYDROCHLOROTHIAZIDE 20-25 MG PO TABS: 1.0000 | ORAL_TABLET | Freq: Every day | ORAL | Status: DC

## 2013-04-19 MED ORDER — LOVASTATIN 20 MG PO TABS: 20.0000 mg | ORAL_TABLET | Freq: Every day | ORAL | Status: DC

## 2013-04-19 NOTE — Telephone Encounter (Signed)
Pt aware to pick scripts up at [facility]

## 2013-04-30 ENCOUNTER — Ambulatory Visit: Payer: Self-pay | Admitting: Internal Medicine

## 2013-12-31 ENCOUNTER — Ambulatory Visit (INDEPENDENT_AMBULATORY_CARE_PROVIDER_SITE_OTHER): Payer: BC Managed Care – PPO | Admitting: Medical

## 2013-12-31 ENCOUNTER — Encounter: Payer: Self-pay | Admitting: Medical

## 2013-12-31 VITALS — BP 120/82 | HR 84 | Temp 98.3°F | Resp 16 | Ht 68.0 in | Wt 185.0 lb

## 2013-12-31 DIAGNOSIS — Z8249 Family history of ischemic heart disease and other diseases of the circulatory system: Secondary | ICD-10-CM

## 2013-12-31 DIAGNOSIS — N521 Erectile dysfunction due to diseases classified elsewhere: Secondary | ICD-10-CM

## 2013-12-31 DIAGNOSIS — R7301 Impaired fasting glucose: Secondary | ICD-10-CM

## 2013-12-31 DIAGNOSIS — I10 Essential (primary) hypertension: Secondary | ICD-10-CM

## 2013-12-31 DIAGNOSIS — Z720 Tobacco use: Secondary | ICD-10-CM

## 2013-12-31 DIAGNOSIS — F17200 Nicotine dependence, unspecified, uncomplicated: Secondary | ICD-10-CM

## 2013-12-31 LAB — COMPREHENSIVE METABOLIC PANEL
ALBUMIN: 4.5 g/dL (ref 3.5–5.2)
ALT: 44 U/L (ref 0–53)
AST: 25 U/L (ref 0–37)
Alkaline Phosphatase: 72 U/L (ref 39–117)
BUN: 14 mg/dL (ref 6–23)
CALCIUM: 9.3 mg/dL (ref 8.4–10.5)
CHLORIDE: 102 meq/L (ref 96–112)
CO2: 27 meq/L (ref 19–32)
CREATININE: 0.85 mg/dL (ref 0.50–1.35)
Glucose, Bld: 98 mg/dL (ref 70–99)
POTASSIUM: 3.8 meq/L (ref 3.5–5.3)
SODIUM: 139 meq/L (ref 135–145)
TOTAL PROTEIN: 7.2 g/dL (ref 6.0–8.3)
Total Bilirubin: 0.8 mg/dL (ref 0.2–1.2)

## 2013-12-31 LAB — CBC
HEMATOCRIT: 44.8 % (ref 39.0–52.0)
HEMOGLOBIN: 16 g/dL (ref 13.0–17.0)
MCH: 30.8 pg (ref 26.0–34.0)
MCHC: 35.7 g/dL (ref 30.0–36.0)
MCV: 86.3 fL (ref 78.0–100.0)
Platelets: 347 10*3/uL (ref 150–400)
RBC: 5.19 MIL/uL (ref 4.22–5.81)
RDW: 13.5 % (ref 11.5–15.5)
WBC: 10.3 10*3/uL (ref 4.0–10.5)

## 2013-12-31 NOTE — Progress Notes (Signed)
Subjective:   Donald Oliver is a 43 y.o. male presenting on 12/31/2013 with Hypertension  Subjective: Here as a new patient today.  He has concerns about his BP.   Hx/o hypertension x 10 years or so.   BP fluctuates.   He checks BP at [facility] pharmacy periodically.  Numbers vary widely but usually elevated.   Recently got a reading of 180/120 at pharmacy.  Lately he notes intermittent variety of symptoms including spots in his vision, back pain, headaches, chest pains.  Does have hx/o GERD and certainly feels the pressure worse if he runs out of Nexium.   Gets chest pains/tightness/discomfort from left to center chest area.  Chest pains last most of the day.  Not worse with activity.  No SOB.  No palpitations.   No swelling in legs.  Eats more fast food that he should.   Vision changes - sometimes black squiggly lines, other times gets color spots in vision, resolves quickly.  No corrective lenses, no eye doctor visit.  He is a smoker, 1ppd x 25 years.  No prior cardiac testing.  He notes about a year hx/o erection problems, trouble getting and keeping erections.  No morning erections.  Married.    Family hx/o: Father diagnosed 55yo with MI, died 38yo from MI.  No other complaint.  Review of Systems ROS as in subjective      Objective:    BP 120/82  Pulse 84  Temp(Src) 98.3 F (36.8 C) (Oral)  Resp 16  Ht 5\' 8"  (1.727 m)  Wt 185 lb (83.915 kg)  BMI 28.14 kg/m2  General appearance: alert, no distress, WD/WN, white male Neck: supple, no lymphadenopathy, no thyromegaly, no masses, no bruits Heart: RRR, normal S1, S2, no murmurs Lungs: CTA bilaterally, no wheezes, rhonchi, or rales Abdomen: +bs, soft, non tender, non distended, no masses, no hepatomegaly, no splenomegaly GU: normal male genitalia, circumcised, testicles nontender, no mass, no swelling, no deformity, no lymphadenopathy Pulses: 2+ symmetric, upper and lower extremities, normal cap refill Ext: no edema   Adult ECG Report  Indication: chest pain, hypertension  Rate: 72bpm  Rhythm: normal sinus rhythm  QRS Axis: 65 degrees  PR Interval: 137ms  QRS Duration: 160ms  QTc: 462ms  Conduction Disturbances: none  Other Abnormalities: Q in III, aVF, T wave inversion III  Patient's cardiac risk factors are: family history of premature cardiovascular disease, hypertension, male gender and smoking/ tobacco exposure.  EKG comparison: none  Narrative Interpretation: possible infarct age undetermined       Assessment: Encounter Diagnoses  Name Primary?  . Essential hypertension Yes  . Smoker   . Impaired fasting glucose   . Erectile dysfunction due to diseases classified elsewhere   . Family history of premature CAD      Plan: discussed concerns, risks of uncontrolled BP, cardiac risk factors.   Referral to cardiology for baseline eval given 10 year hx/o HTN, smoker, recent collection of symptoms, and given his risk factors including premature CAD in first degree relative and ED symptoms.   Advised he get a cuff to take BPs regularly at home.  Discussed smoking cessation, healthy diet, exercise.  We will call with lab results.    [name] was seen today for hypertension.  Diagnoses and associated orders for this visit:  Essential hypertension - Hemoglobin A1c - Comprehensive metabolic panel - TSH - CBC - High sensitivity CRP - EKG 12-Lead - PR ELECTROCARDIOGRAM, COMPLETE  Smoker - Hemoglobin A1c - Comprehensive metabolic  panel - TSH - CBC - High sensitivity CRP - EKG 12-Lead - PR ELECTROCARDIOGRAM, COMPLETE  Impaired fasting glucose - Hemoglobin A1c - Comprehensive metabolic panel - TSH - CBC - High sensitivity CRP - EKG 12-Lead - PR ELECTROCARDIOGRAM, COMPLETE  Erectile dysfunction due to diseases classified elsewhere - Hemoglobin A1c - Comprehensive metabolic panel - TSH - CBC - High sensitivity CRP - EKG 12-Lead - PR ELECTROCARDIOGRAM, COMPLETE  Family history  of premature CAD - Hemoglobin A1c - Comprehensive metabolic panel - TSH - CBC - High sensitivity CRP - EKG 12-Lead - PR ELECTROCARDIOGRAM, COMPLETE    Return pending labs.

## 2014-01-01 LAB — HEMOGLOBIN A1C
HEMOGLOBIN A1C: 5.9 % — AB (ref <5.7)
MEAN PLASMA GLUCOSE: 123 mg/dL — AB (ref <117)

## 2014-01-01 LAB — TSH: TSH: 1.579 u[IU]/mL (ref 0.350–4.500)

## 2014-01-01 LAB — HIGH SENSITIVITY CRP: CRP, High Sensitivity: 12.2 mg/L — ABNORMAL HIGH

## 2014-01-23 ENCOUNTER — Encounter: Payer: Self-pay | Admitting: Cardiology

## 2014-01-23 DIAGNOSIS — Z8249 Family history of ischemic heart disease and other diseases of the circulatory system: Secondary | ICD-10-CM | POA: Insufficient documentation

## 2014-01-23 DIAGNOSIS — N529 Male erectile dysfunction, unspecified: Secondary | ICD-10-CM | POA: Insufficient documentation

## 2014-01-23 NOTE — Progress Notes (Signed)
Patient ID: Donald Oliver, male   DOB: [birth year], 43 y.o.   MRN: [id number]   Donald Oliver    Date of visit:  01/23/2014 DOB:  1970-09-23    Age:  43 yrs. Medical record number:  [id number]     Account number:  [id number] Primary Care Provider: Robyne Oliver ____________________________ CURRENT DIAGNOSES  1. Essential (primary) hypertension  2. Family history of ischemic heart disease and other diseases of the circulatory system  3. Toxic effect of tobacco cigarettes, undetermined, initial encounter  4. Overweight  5. Hyperlipidemia  6. Chest pain, unspecified ____________________________ ALLERGIES  Amoxicillin, Swelling (non-specific) ____________________________ MEDICATIONS  1. lisinopril 20 mg-hydrochlorothiazide 25 mg tablet, 1 p.o. daily  2. Nexium 20 mg capsule,delayed release, 1 p.o. daily  3. aspirin 81 mg chewable tablet, 1 p.o. daily ____________________________ CHIEF COMPLAINTS  Chest pain lasts all day ____________________________ HISTORY OF PRESENT ILLNESS This very nice 43 year old male is seen for evaluation of hypertension and cardiovascular risk assessment. I took care of his father beginning at age 44 and he ultimately died at age 44 of complications of coronary artery disease. He has a prior history of tobacco abuse for over 25 years and smokes about a pack of cigarettes per day. He was diagnosed with essential hypertension and states his blood pressure has been elevated at home and when he has it checked at the drug store although he tends to be normotensive at the doctor's office. He does fairly heavy physical labor without chest pain but occasionally has some atypical chest pain described as sharp pain to is not associated with activity. He has significant hyperlipidemia and at one time took Mevacor but stated that it made his legs hurt. His hyperlipidemia is currently untreated. He was seen recently and was found to have a high CRP and was sent here for evaluation of  hypertension and further cardiac risk assessment. He denies anginal pain and has no PND orthopnea, syncope, or palpitations. He does have a remote history of amphetamine abuse but has been substance free for about 10 years. He also complains of significant erectile dysfunction. He does not have the morning erections and has difficulty maintaining as well as getting an erection. ____________________________ PAST HISTORY  Past Medical Illnesses:  hypertension, GERD, erectile dysfunction;  Cardiovascular Illnesses:  no previous history of cardiac disease;  Infectious Diseases:  no previous history of significant infectious diseases;  Surgical Procedures:  deviated septum repair;  Trauma History:  no previous history of significant trauma;  NYHA Classification:  I;  Cardiology Procedures-Invasive:  no previous interventional or invasive cardiology procedures;  Cardiology Procedures-Noninvasive:  no previous non-invasive cardiovascular testing;  Peripheral Vascular Procedures:  no previous invasive peripheral vascular procedures.;  LVEF not documented,   ____________________________ CARDIO-PULMONARY TEST DATES EKG Date:  01/23/2014;   ____________________________ FAMILY HISTORY Father -- Father dead, Coronary artery bypass grafting, Hypercholesterolemia, Diabetes mellitus Mother -- Mother alive with problem, Hypertension Sister -- Sister dead, AIDS Sister -- Sister alive and well ____________________________ SOCIAL HISTORY Alcohol Use:  occasionally;  Smoking:  smokes cigarettes,, 25 pack year history;  Diet:  regular diet;  Lifestyle:  married, 1 daughter and 2 stepdaughters;  Exercise:  some exercise;  Occupation:  Oceanographer;  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  Integumentary:  no rashes or new skin lesions. Eyes:  denies diplopia, history of glaucoma or  visual problems.  Ears, Nose, Throat, Mouth:  denies any hearing loss, epistaxis, hoarseness or difficulty speaking.  Respiratory: denies dyspnea, cough, wheezing or hemoptysis. Cardiovascular:  please review HPI  Abdominal:  dyspepsia  Genitourinary-Male: erectile dysfunction  Musculoskeletal:  arthritis of the hands  Neurological:  denies headaches, stroke, or TIA  Psychiatric:  denies depession or anxiety  ____________________________ PHYSICAL EXAMINATION VITAL SIGNS  Blood Pressure:  128/74 Sitting, Right arm, regular cuff  , 120/80 Standing, Right arm and regular cuff   Pulse:  88/min. Weight:  186.00 lbs. Height:  69"BMI: 27  Constitutional:  pleasant white male in no acute distress, overweight Skin:  warm and dry to touch, no apparent skin lesions, or masses noted. Head:  normocephalic, normal hair pattern, no masses or tenderness Eyes:  EOMS Intact, PERRLA, C and S clear, Funduscopic exam not done. ENT:  ears, nose and throat reveal no gross abnormalities.  Dentition good. Neck:  supple, without massess. No JVD, thyromegaly or carotid bruits. Carotid upstroke normal. Chest:  normal symmetry, clear to auscultation. Cardiac:  regular rhythm, normal S1 and S2, No S3 or S4, no murmurs, gallops or rubs detected. Abdomen:  abdomen soft,non-tender, no masses, no hepatospenomegaly, or aneurysm noted Peripheral Pulses:  the femoral,dorsalis pedis, and posterior tibial pulses are full and equal bilaterally with no bruits auscultated. Extremities & Back:  no deformities, clubbing, cyanosis, erythema or edema observed. Normal muscle strength and tone. Neurological:  no gross motor or sensory deficits noted, affect appropriate, oriented x3. ____________________________ MOST RECENT LIPID PANEL 02/27/13  CHOL TOTL 243 mg/dl, LDL 155 NM, HDL 46 mg/dl and TRIGLYCER 211 mg/dl ____________________________ IMPRESSIONS/PLAN 1. Chest pain with atypical features 2. Hypertension currently controlled here  but elevated according to outside readings 3. Combined hyperlipidemia currently not treated 4. Smoking advised to stop 5. Significant family history of premature cardiac disease 6. Erectile dysfunction 7. Overweight  Recommendations:  His calculated 10 year risk of cardiac events is currently 10.6%. He has a significant family history of cardiac disease with multiple risk factors. I recommended that he have a cardiac calcium score that we start with a standard exercise treadmill test to evaluate for ischemia.  In terms of his cardiovascular risk factors I would recommend treatment of his hyperlipidemia based on his cardiac risk profile. We initiated treatment with Crestor 10 mg daily to see if he would tolerate this. We also discussed that he will obtain another blood pressure cuff and continue to monitor blood pressures at home. We discussed weight reduction and following a carbohydrate reduced Mediterranean type diet such as the [employer]. Following his treadmill test we can give an exercise prescription. We discussed smoking cessation as well as quitting a quit date and the use of nicotine replacement therapy. Thank you for asking me to see him with you. ____________________________ TODAYS ORDERS  1. treadmill:  Regular TM At At Patient Convenience  2. 12 Lead EKG: Today  3. Cardiac calcium score: at pat convenience                       ____________________________ Cardiology Physician:  Kerry Hough MD [facility]

## 2014-02-11 ENCOUNTER — Ambulatory Visit (INDEPENDENT_AMBULATORY_CARE_PROVIDER_SITE_OTHER): Payer: BC Managed Care – PPO | Admitting: Medical

## 2014-02-11 ENCOUNTER — Encounter: Payer: Self-pay | Admitting: Medical

## 2014-02-11 VITALS — BP 130/80 | HR 82 | Temp 98.1°F | Resp 16 | Wt 185.0 lb

## 2014-02-11 DIAGNOSIS — R0600 Dyspnea, unspecified: Secondary | ICD-10-CM

## 2014-02-11 DIAGNOSIS — N528 Other male erectile dysfunction: Secondary | ICD-10-CM

## 2014-02-11 DIAGNOSIS — J208 Acute bronchitis due to other specified organisms: Secondary | ICD-10-CM

## 2014-02-11 DIAGNOSIS — R05 Cough: Secondary | ICD-10-CM

## 2014-02-11 DIAGNOSIS — F17200 Nicotine dependence, unspecified, uncomplicated: Secondary | ICD-10-CM

## 2014-02-11 DIAGNOSIS — Z720 Tobacco use: Secondary | ICD-10-CM

## 2014-02-11 DIAGNOSIS — R059 Cough, unspecified: Secondary | ICD-10-CM

## 2014-02-11 MED ORDER — ALBUTEROL SULFATE HFA 108 (90 BASE) MCG/ACT IN AERS: 2.0000 | INHALATION_SPRAY | Freq: Four times a day (QID) | RESPIRATORY_TRACT | Status: DC | PRN

## 2014-02-11 MED ORDER — SILDENAFIL CITRATE 50 MG PO TABS: 50.0000 mg | ORAL_TABLET | Freq: Every day | ORAL | Status: DC | PRN

## 2014-02-11 MED ORDER — AZITHROMYCIN 250 MG PO TABS
ORAL_TABLET | ORAL | Status: DC
Start: 2014-02-11 — End: 2015-02-25

## 2014-02-11 NOTE — Progress Notes (Signed)
Subjective:  Donald Oliver is a 43 y.o. male who presents for dyspnea and cough.  Symptoms include over a week hx/o cough, nonproductive, dyspnea, pressure and fullness of ears, headache after cough, chest tightness and wheezing, runny nose, mild chills and body aches.   Denies sore throat, NVD, fever.  Treatment to date: cough suppressants.  Daughter - sick contacts with respiratory symptoms, was put on antibiotic.   He does smoke.   He does not have a history of asthma, COPD.   No prior inhaler use.  No other aggravating or relieving factors.    After his last visit here he was seen by cardiology, had stress test, reportedly things are normal. He notes that Dr. Wynonia Lawman gave him a few samples of 25 mg Viagra at his request, didn't see much improvement with erections with 25 mg.would like to try something stronger. Denies any side effects of the medication  No other c/o.  The following portions of the patient's history were reviewed and updated as appropriate: allergies, current medications, past family history, past medical history, past social history, past surgical history and problem list.  ROS as in subjective  Past Medical History  Diagnosis Date  . FH: premature coronary heart disease     Father had MI at age 74   . History of substance abuse     Pt had abused amphetamines : quit in 2005  . Smoker   . Hypertension 2005  . GERD (gastroesophageal reflux disease)   . Combined hyperlipidemia      Objective: BP 130/80 mmHg  Pulse 82  Temp(Src) 98.1 F (36.7 C) (Oral)  Resp 16  Wt 185 lb (83.915 kg)   General appearance: Alert, WD/WN, no distress,mildly ill appearing                             Skin: warm, no rash, no diaphoresis                           Head: maxillary sinus tenderness                            Eyes: conjunctiva normal, corneas clear, PERRLA                            Ears: pearly TMs, external ear canals normal                          Nose: septum midline,  turbinates swollen, with erythema and no discharge             Mouth/throat: MMM, tongue normal, mild pharyngeal erythema                           Neck: supple, no adenopathy, no thyromegaly, nontender                          Heart: RRR, normal S1, S2, no murmurs                         Lungs: +bronchial breath sounds, +scattered rhonchi, no wheezes, no rales                Extremities: no  edema, nontender     Assessment: Encounter Diagnoses  Name Primary?  . Acute bronchitis due to other specified organisms Yes  . Cough   . Dyspnea   . Tobacco use disorder   . Other male erectile dysfunction       Plan:  Reviewed CXR.  No obvious pneumonia no cardiomegaly no other obvious abnormality other than bronchial congestion suggested bronchitis  Medication orders today include: Zpak, albuterol inhaler, discussed proper use of inhaler.  Discussed diagnosis and treatment of bronchitis.  Suggested symptomatic OTC remedies for cough and congestion.  Tylenol or Ibuprofen OTC for fever and malaise.  Call/return in 2-3 days if symptoms are worse or not improving.  Advised that cough may linger even after the infection is improved.  Encouraged him to quit smoking.    Erectile Dysfunction - Reviewed pathophysiology and differential diagnosis of erectile dysfunction with the patient.  Discussed treatment options.  We will request the follow-up cardiology notes and stress test results.  Gave him samples of 50 mg Viagra to hold onto until we get cardiology notes.  Once we review the additional records and call him back then he will likely begin trial of Viagra.  Discussed potential risks of medications including hypotension and priapism.  Discussed proper use of medication.  Questions were answered.   F/u pending call back.

## 2014-02-12 ENCOUNTER — Telehealth: Payer: Self-pay | Admitting: Medical

## 2014-02-12 NOTE — Telephone Encounter (Signed)
I reviewed the cardiology notes.   He can go ahead and try the Viagra 50mg  dose.

## 2014-02-13 NOTE — Telephone Encounter (Signed)
Patient is aware of [facility] message in detail.

## 2014-02-17 ENCOUNTER — Encounter: Payer: Self-pay | Admitting: Medical

## 2014-03-28 DIAGNOSIS — R7301 Impaired fasting glucose: Secondary | ICD-10-CM

## 2014-03-28 HISTORY — DX: Impaired fasting glucose: R73.01

## 2014-05-05 ENCOUNTER — Encounter: Payer: Self-pay | Admitting: Medical

## 2014-11-19 ENCOUNTER — Other Ambulatory Visit: Payer: Self-pay | Admitting: Occupational Medicine

## 2014-11-19 ENCOUNTER — Ambulatory Visit: Payer: Self-pay

## 2014-11-19 DIAGNOSIS — M79671 Pain in right foot: Secondary | ICD-10-CM

## 2015-02-09 ENCOUNTER — Telehealth: Payer: Self-pay | Admitting: Medical

## 2015-02-09 ENCOUNTER — Other Ambulatory Visit: Payer: Self-pay | Admitting: Medical

## 2015-02-09 MED ORDER — LISINOPRIL-HYDROCHLOROTHIAZIDE 20-25 MG PO TABS: 1.0000 | ORAL_TABLET | Freq: Every day | ORAL | Status: DC

## 2015-02-09 NOTE — Telephone Encounter (Signed)
Pt made a cpe appt needs refills of bp meds until then. appt is 11/30 and pt uses [facility] on [address].

## 2015-02-25 ENCOUNTER — Encounter: Payer: Self-pay | Admitting: Medical

## 2015-02-25 ENCOUNTER — Telehealth: Payer: Self-pay | Admitting: Medical

## 2015-02-25 ENCOUNTER — Ambulatory Visit (INDEPENDENT_AMBULATORY_CARE_PROVIDER_SITE_OTHER): Payer: BLUE CROSS/BLUE SHIELD | Admitting: Medical

## 2015-02-25 VITALS — BP 120/90 | HR 71 | Ht 68.5 in | Wt 181.0 lb

## 2015-02-25 DIAGNOSIS — F17200 Nicotine dependence, unspecified, uncomplicated: Secondary | ICD-10-CM

## 2015-02-25 DIAGNOSIS — Z125 Encounter for screening for malignant neoplasm of prostate: Secondary | ICD-10-CM | POA: Insufficient documentation

## 2015-02-25 DIAGNOSIS — K219 Gastro-esophageal reflux disease without esophagitis: Secondary | ICD-10-CM | POA: Insufficient documentation

## 2015-02-25 DIAGNOSIS — H539 Unspecified visual disturbance: Secondary | ICD-10-CM

## 2015-02-25 DIAGNOSIS — I10 Essential (primary) hypertension: Secondary | ICD-10-CM | POA: Diagnosis not present

## 2015-02-25 DIAGNOSIS — B351 Tinea unguium: Secondary | ICD-10-CM | POA: Diagnosis not present

## 2015-02-25 DIAGNOSIS — F1521 Other stimulant dependence, in remission: Secondary | ICD-10-CM | POA: Diagnosis not present

## 2015-02-25 DIAGNOSIS — L989 Disorder of the skin and subcutaneous tissue, unspecified: Secondary | ICD-10-CM | POA: Diagnosis not present

## 2015-02-25 DIAGNOSIS — Z8249 Family history of ischemic heart disease and other diseases of the circulatory system: Secondary | ICD-10-CM | POA: Diagnosis not present

## 2015-02-25 DIAGNOSIS — N528 Other male erectile dysfunction: Secondary | ICD-10-CM | POA: Diagnosis not present

## 2015-02-25 DIAGNOSIS — Z0000 Encounter for general adult medical examination without abnormal findings: Secondary | ICD-10-CM | POA: Insufficient documentation

## 2015-02-25 DIAGNOSIS — Z23 Encounter for immunization: Secondary | ICD-10-CM | POA: Diagnosis not present

## 2015-02-25 DIAGNOSIS — Z720 Tobacco use: Secondary | ICD-10-CM

## 2015-02-25 DIAGNOSIS — E782 Mixed hyperlipidemia: Secondary | ICD-10-CM

## 2015-02-25 HISTORY — DX: Encounter for general adult medical examination without abnormal findings: Z00.00

## 2015-02-25 HISTORY — DX: Gastro-esophageal reflux disease without esophagitis: K21.9

## 2015-02-25 LAB — COMPREHENSIVE METABOLIC PANEL
ALBUMIN: 4.3 g/dL (ref 3.6–5.1)
ALK PHOS: 58 U/L (ref 40–115)
ALT: 35 U/L (ref 9–46)
AST: 24 U/L (ref 10–40)
BILIRUBIN TOTAL: 1.2 mg/dL (ref 0.2–1.2)
BUN: 12 mg/dL (ref 7–25)
CALCIUM: 9 mg/dL (ref 8.6–10.3)
CO2: 25 mmol/L (ref 20–31)
CREATININE: 0.7 mg/dL (ref 0.60–1.35)
Chloride: 101 mmol/L (ref 98–110)
Glucose, Bld: 75 mg/dL (ref 65–99)
Potassium: 3.9 mmol/L (ref 3.5–5.3)
SODIUM: 137 mmol/L (ref 135–146)
TOTAL PROTEIN: 7 g/dL (ref 6.1–8.1)

## 2015-02-25 LAB — CBC
HEMATOCRIT: 45.8 % (ref 39.0–52.0)
HEMOGLOBIN: 16.1 g/dL (ref 13.0–17.0)
MCH: 30.7 pg (ref 26.0–34.0)
MCHC: 35.2 g/dL (ref 30.0–36.0)
MCV: 87.4 fL (ref 78.0–100.0)
MPV: 10.2 fL (ref 8.6–12.4)
Platelets: 277 10*3/uL (ref 150–400)
RBC: 5.24 MIL/uL (ref 4.22–5.81)
RDW: 13.6 % (ref 11.5–15.5)
WBC: 10.3 10*3/uL (ref 4.0–10.5)

## 2015-02-25 LAB — POCT URINALYSIS DIPSTICK
Bilirubin, UA: NEGATIVE
Glucose, UA: NEGATIVE
Ketones, UA: NEGATIVE
Leukocytes, UA: NEGATIVE
Nitrite, UA: NEGATIVE
PROTEIN UA: NEGATIVE
RBC UA: NEGATIVE
SPEC GRAV UA: 1.015
UROBILINOGEN UA: NEGATIVE
pH, UA: 7

## 2015-02-25 LAB — LIPID PANEL
Cholesterol: 148 mg/dL (ref 125–200)
HDL: 37 mg/dL — ABNORMAL LOW (ref >=40)
LDL Cholesterol: 63 mg/dL (ref <130)
Total CHOL/HDL Ratio: 4 Ratio (ref <=5.0)
Triglycerides: 238 mg/dL — ABNORMAL HIGH (ref <150)
VLDL: 48 mg/dL — ABNORMAL HIGH (ref <30)

## 2015-02-25 LAB — HEMOGLOBIN A1C
HEMOGLOBIN A1C: 5.7 % — AB (ref <5.7)
Mean Plasma Glucose: 117 mg/dL — ABNORMAL HIGH (ref <117)

## 2015-02-25 MED ORDER — DEXLANSOPRAZOLE 60 MG PO CPDR: 60.0000 mg | DELAYED_RELEASE_CAPSULE | Freq: Every day | ORAL | Status: DC

## 2015-02-25 NOTE — Telephone Encounter (Signed)
Have him return for Tdap as I forgot to tell you about this.    See other message below about referrals.

## 2015-02-25 NOTE — Progress Notes (Signed)
Subjective:   HPI  Donald Oliver is a 44 y.o. male who presents for a complete physical.  Concerns: Thinks he has toenail fungus.  Has some thickened yellow nails, mainly right foot  Just saw Dr. Wynonia Lawman 2 weeks ago for routine f/u.   Had normal stress test last year.  compliant with medications for cholesterol, blood pressure.  Has been having some black spots in vision occasionally, no other vision changes. No prior eye doctor visit.  He notes GERD long term, has failed Nexium, zantac, Prilosec, and other acid blockers OTC.  Reviewed their medical, surgical, family, social, medication, and allergy history and updated chart as appropriate.  Past Medical History  Diagnosis Date  . FH: premature coronary heart disease     Father had MI at age 54   . History of substance abuse     Pt had abused amphetamines : quit in 2005  . Smoker   . Hypertension 2005  . GERD (gastroesophageal reflux disease)   . Combined hyperlipidemia   . Impaired fasting blood sugar 2016  . Onychomycosis   . Normal cardiac stress test 01/2014    Dr. Wynonia Lawman    Past Surgical History  Procedure Laterality Date  . Rhinoplasty      2006 done at [facility] [facility] History  . Marital Status: Legally Separated    Spouse Name: N/A  . Number of Children: 3  . Years of Education: 12    Occupational History  . Sports coach     unemployed    Social History Main Topics  . Smoking status: Current Every Day Smoker -- 1.00 packs/day for 25 years  . Smokeless tobacco: Never Used  . Alcohol Use: No  . Drug Use: 1.00 per week    Special: Marijuana  . Sexual Activity: Yes   Other Topics Concern  . Not on file   Social History Narrative   Married has 3 girls ages 12yo, 45yo, 45yo, works at [employer], walks several miles daily at work, hx/o substance abuse in the past    Family History  Problem Relation Age of Onset  . Heart failure Father   . Heart disease  Father 34    died of MI  . Diabetes Father   . HIV Sister      Current outpatient prescriptions:  .  aspirin (ASPIRIN EC) 81 MG EC tablet, Take 1 tablet (81 mg total) by mouth daily. Swallow whole., Disp: 30 tablet, Rfl: 12 .  esomeprazole (NEXIUM) 20 MG capsule, Take 20 mg by mouth daily at 12 noon., Disp: , Rfl:  .  lisinopril-hydrochlorothiazide (PRINZIDE,ZESTORETIC) 20-25 MG tablet, Take 1 tablet by mouth daily., Disp: 30 tablet, Rfl: 0 .  rosuvastatin (CRESTOR) 10 MG tablet, Take 10 mg by mouth daily., Disp: , Rfl:  .  albuterol (PROVENTIL HFA;VENTOLIN HFA) 108 (90 BASE) MCG/ACT inhaler, Inhale 2 puffs into the lungs every 6 (six) hours as needed for wheezing or shortness of breath. (Patient not taking: Reported on 02/25/2015), Disp: 1 Inhaler, Rfl: 0 .  azithromycin (ZITHROMAX) 250 MG tablet, 2 tablets day 1, then 1 tablet days 2-4 (Patient not taking: Reported on 02/25/2015), Disp: 6 tablet, Rfl: 0 .  sildenafil (VIAGRA) 50 MG tablet, Take 1 tablet (50 mg total) by mouth daily as needed for erectile dysfunction. (Patient not taking: Reported on 02/25/2015), Disp: 2 tablet, Rfl: 0  Allergies  Allergen Reactions  . Amoxicillin     Review  of Systems Constitutional: -fever, -chills, -sweats, -unexpected weight change, -decreased appetite, -fatigue Allergy: -sneezing, -itching, -congestion Dermatology: -changing moles, --rash, -lumps ENT: -runny nose, -ear pain, -sore throat, -hoarseness, -sinus pain, -teeth pain, - ringing in ears, -hearing loss, -nosebleeds Cardiology: -chest pain, -palpitations, -swelling, -difficulty breathing when lying flat, -waking up short of breath Respiratory: -cough, -shortness of breath, -difficulty breathing with exercise or exertion, -wheezing, -coughing up blood Gastroenterology: -abdominal pain, -nausea, -vomiting, -diarrhea, -constipation, -blood in stool, -changes in bowel movement, -difficulty swallowing or eating Hematology: -bleeding, -bruising   Musculoskeletal: -joint aches, -muscle aches, -joint swelling, -back pain, -neck pain, -cramping, -changes in gait Ophthalmology: denies vision changes, eye redness, itching, discharge Urology: -burning with urination, -difficulty urinating, -blood in urine, -urinary frequency, -urgency, -incontinence Neurology: -headache, -weakness, -tingling, -numbness, -memory loss, -falls, -dizziness Psychology: -depressed mood, -agitation, -sleep problems     Objective:   Physical Exam  BP 120/90 mmHg  Pulse 71  Ht 5' 8.5" (1.74 m)  Wt 181 lb (82.101 kg)  BMI 27.12 kg/m2  General appearance: alert, no distress, WD/WN, white male Skin: right great toe and 5th toenail thickened and yellow.  several skin tags of supraspinatus region bilat, several scattered macules, left upper back 59mm crusted round lesion along scapular area, striae of abdomen, right medial upper arm just proximal to elbow and right buttock with well circumscribed brown round 1cm lesion unchanged for years per patient, no other worrisome lesions HEENT: normocephalic, conjunctiva/corneas normal, sclerae anicteric, PERRLA, EOMi, nares patent, no discharge or erythema, pharynx normal Oral cavity: MMM, tongue normal, teeth with moderate stain, missing some molars on bottom Neck: supple, no lymphadenopathy, no thyromegaly, no masses, normal ROM, no bruits Chest: non tender, normal shape and expansion Heart: RRR, normal S1, S2, no murmurs Lungs: CTA bilaterally, no wheezes, rhonchi, or rales Abdomen: +bs, soft, mild epigastric tenderness, otherwise non tender, non distended, no masses, no hepatomegaly, no splenomegaly, no bruits Back: non tender, normal ROM, no scoliosis Musculoskeletal: upper extremities non tender, no obvious deformity, normal ROM throughout, lower extremities non tender, no obvious deformity, normal ROM throughout Extremities: no edema, no cyanosis, no clubbing Pulses: 2+ symmetric, upper and lower extremities, normal  cap refill Neurological: alert, oriented x 3, CN2-12 intact, strength normal upper extremities and lower extremities, sensation normal throughout, DTRs 2+ throughout, no cerebellar signs, gait normal Psychiatric: normal affect, behavior normal, pleasant  GU: normal male external genitalia, circumcised, nontender, no masses, no hernia, no lymphadenopathy Rectal: deferred   Assessment and Plan :    Encounter Diagnoses  Name Primary?  . Routine general medical examination at a health care facility Yes  . Essential hypertension   . Combined hyperlipidemia   . Other male erectile dysfunction   . Family history of premature CAD   . History of amphetamine dependence/abuse ([place])   . Smoker   . Vision changes   . Onychomycosis   . Gastroesophageal reflux disease without esophagitis   . Need for Tdap vaccination   . Skin lesion    Physical exam - discussed healthy lifestyle, diet, exercise, preventative care, vaccinations, and addressed their concerns.   HTN, dyslipidemia - compliant with medication. Tobacco - expressed need to stop tobacco! ED - c/t Viagra prn Vision changes - referral to eye doctor GERD, chronic, failed several prior medications.  Change to Dexilant, and will refer to GI after first of the year at his request Skin lesion - return at his convenience for cryotherapy See your dentist yearly for routine dental care including hygiene  visits twice yearly. Onychomycosis - assuming normal liver tests, will begin trial of Lamisil oral We will have him come back for Tdap as he left prior to this taking place.  Follow-up pending labs

## 2015-02-25 NOTE — Telephone Encounter (Signed)
Referred to [facility], tried calling eye center will call again this afternoon. Will come back in tomorrow for tdap

## 2015-02-25 NOTE — Telephone Encounter (Signed)
Refer to Dr. Peter Garter [facility] for consult/new patient, black spots in vision  Refer to GI, but make appt into 2017 January for example for chronic GERD, need for endoscopy

## 2015-02-25 NOTE — Addendum Note (Signed)
Addended by: Billie Lade on: 02/25/2015 02:51 PM   Modules accepted: Miquel Dunn

## 2015-02-26 ENCOUNTER — Other Ambulatory Visit: Payer: Self-pay | Admitting: Medical

## 2015-02-26 LAB — MICROALBUMIN / CREATININE URINE RATIO
Creatinine, Urine: 87 mg/dL (ref 20–370)
MICROALB UR: 0.5 mg/dL
MICROALB/CREAT RATIO: 6 ug/mg{creat} (ref <30)

## 2015-02-26 MED ORDER — ASPIRIN 81 MG PO TBEC: 81.0000 mg | DELAYED_RELEASE_TABLET | Freq: Every day | ORAL | Status: DC

## 2015-02-26 MED ORDER — ROSUVASTATIN CALCIUM 10 MG PO TABS: 10.0000 mg | ORAL_TABLET | Freq: Every day | ORAL | Status: DC

## 2015-02-26 MED ORDER — LISINOPRIL-HYDROCHLOROTHIAZIDE 20-25 MG PO TABS: 1.0000 | ORAL_TABLET | Freq: Every day | ORAL | Status: DC

## 2015-02-26 MED ORDER — TERBINAFINE HCL 250 MG PO TABS: 250.0000 mg | ORAL_TABLET | Freq: Every day | ORAL | Status: DC

## 2015-02-26 MED ORDER — NIACIN ER 500 MG PO CPCR: 500.0000 mg | ORAL_CAPSULE | Freq: Every day | ORAL | Status: DC

## 2015-03-16 ENCOUNTER — Other Ambulatory Visit: Payer: Self-pay | Admitting: Medical

## 2015-03-16 ENCOUNTER — Telehealth: Payer: Self-pay | Admitting: Medical

## 2015-03-16 MED ORDER — NICOTINE 21 MG/24HR TD PT24: 21.0000 mg | MEDICATED_PATCH | Freq: Every day | TRANSDERMAL | Status: DC

## 2015-03-16 NOTE — Telephone Encounter (Signed)
Pt would like a prescription for Nicotine patches the strongest mg and needs Rx so that HSA should pay for it

## 2015-03-16 NOTE — Telephone Encounter (Signed)
P.A. DEXILANT 

## 2015-03-16 NOTE — Telephone Encounter (Signed)
rx sent

## 2015-03-16 NOTE — Telephone Encounter (Signed)
Called pt & he states he has tried Esomeprazole, Lansoprazole, Omeprazole, Pantoprazole, Zantac & Ranitidine

## 2015-03-18 NOTE — Telephone Encounter (Signed)
P.A. Dexilant approved til 03/15/2016, faxed pharmacy, Pt informed

## 2015-04-01 ENCOUNTER — Encounter: Payer: Self-pay | Admitting: Medical

## 2015-04-01 ENCOUNTER — Ambulatory Visit (INDEPENDENT_AMBULATORY_CARE_PROVIDER_SITE_OTHER): Payer: BLUE CROSS/BLUE SHIELD | Admitting: Medical

## 2015-04-01 VITALS — BP 120/80 | HR 94 | Temp 98.5°F | Resp 20 | Wt 181.0 lb

## 2015-04-01 DIAGNOSIS — Z565 Uncongenial work environment: Secondary | ICD-10-CM

## 2015-04-01 DIAGNOSIS — J988 Other specified respiratory disorders: Secondary | ICD-10-CM

## 2015-04-01 DIAGNOSIS — Z23 Encounter for immunization: Secondary | ICD-10-CM

## 2015-04-01 DIAGNOSIS — Z579 Occupational exposure to unspecified risk factor: Secondary | ICD-10-CM

## 2015-04-01 LAB — POC INFLUENZA A&B (BINAX/QUICKVUE)
Influenza A, POC: NEGATIVE
Influenza B, POC: NEGATIVE

## 2015-04-01 MED ORDER — ALBUTEROL SULFATE HFA 108 (90 BASE) MCG/ACT IN AERS
2.0000 | INHALATION_SPRAY | Freq: Four times a day (QID) | RESPIRATORY_TRACT | Status: DC | PRN
Start: 2015-04-01 — End: 2015-07-20

## 2015-04-01 MED ORDER — MUCINEX DM MAXIMUM STRENGTH 60-1200 MG PO TB12: 1.0000 | ORAL_TABLET | Freq: Two times a day (BID) | ORAL | Status: DC

## 2015-04-01 MED ORDER — AZITHROMYCIN 250 MG PO TABS: ORAL_TABLET | ORAL | Status: DC

## 2015-04-01 NOTE — Progress Notes (Signed)
Subjective: Chief Complaint  Patient presents with  . muscle aches    has dry cough, not much brown production but some. feels like he has a fever. said it comes and goes. this has been going on for a month states the pt. headache. nausea.  flu swab running.    Here for illness.   He notes sick off and on for a month, thought it was a cold, then had stomach bug for a few days.  Was off the week after Christmas, felt better, but starting Monday 4 days ago had cough, feverish, headaches, hot and cold feeling.  decreased appetite, a little nausea.   Has some muscle pain, fatigue.   Feels a little wheezy.   Hasn't used inhaler, but lost it.  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    Father in law has been in hospital and they have visited there.  Using some Delsym.  Also needs his Tdap from last visit.  No other aggravating or relieving factors. No other complaint.  Past Medical History  Diagnosis Date  . FH: premature coronary heart disease     Father had MI at age 34   . History of substance abuse     Pt had abused amphetamines : quit in 2005  . Smoker   . Hypertension 2005  . GERD (gastroesophageal reflux disease)   . Combined hyperlipidemia   . Impaired fasting blood sugar 2016  . Onychomycosis   . Normal cardiac stress test 01/2014    Dr. Wynonia Lawman   ROS as in subjective  Objective: BP 120/80 mmHg  Pulse 94  Temp(Src) 98.5 F (36.9 C) (Oral)  Resp 20  Wt 181 lb (82.101 kg)  SpO2 97%  General appearance: alert, no distress, WD/WN,  HEENT: normocephalic, sclerae anicteric, TMs pearly, nares patent, no discharge but mild erythema, pharynx normal Oral cavity: MMM, no lesions Neck: supple, shoddy left anterior nodes otherwise no lymphadenopathy, no thyromegaly, no masses Heart: RRR, normal S1, S2, no murmurs Lungs: bronchial breath sounds, otherwise, no wheezes, rhonchi, or rales Pulses: 2+ symmetric, upper and lower extremities, normal cap refill   Plan: Flu swab  negative.   Begin zpak, albuterol, rest and hydrate well.  If not much improved in next few days, then call or recheck  In general advised yearly PPD test or twice yearly given exposure risks.   He declines labs or CXR today.  Counseled on the Tdap (tetanus, diptheria, and acellular pertussis) vaccine.  Vaccine information sheet given. Tdap vaccine given after consent obtained.  Shadrack was seen today for muscle aches.  Diagnoses and all orders for this visit:  Respiratory tract infection  Need for Tdap vaccination  Occupational exposure in workplace  Other orders -     azithromycin (ZITHROMAX) 250 MG tablet; 2 tablets day 1, then 1 tablet days 2-4 -     albuterol (PROVENTIL HFA;VENTOLIN HFA) 108 (90 Base) MCG/ACT inhaler; Inhale 2 puffs into the lungs every 6 (six) hours as needed for wheezing or shortness of breath. -     Dextromethorphan-Guaifenesin (MUCINEX DM MAXIMUM STRENGTH) 60-1200 MG TB12; Take 1 tablet by mouth 2 (two) times daily.

## 2015-04-01 NOTE — Addendum Note (Signed)
Addended by: Billie Lade on: 04/01/2015 02:05 PM   Modules accepted: Orders

## 2015-04-16 ENCOUNTER — Telehealth: Payer: Self-pay

## 2015-04-16 NOTE — Telephone Encounter (Signed)
[facility] sent a fax requesting 90 day supply of Dexilant 60mg  R-4 to be sent through them. I verified this with the pt and he says he is wanting to use this pharmacy now with a 90 day supply.

## 2015-04-17 MED ORDER — DEXLANSOPRAZOLE 60 MG PO CPDR: 60.0000 mg | DELAYED_RELEASE_CAPSULE | Freq: Every day | ORAL | Status: DC

## 2015-04-17 NOTE — Telephone Encounter (Signed)
[phone] with one refill

## 2015-04-28 ENCOUNTER — Other Ambulatory Visit: Payer: Self-pay | Admitting: Medical

## 2015-04-28 ENCOUNTER — Telehealth: Payer: Self-pay | Admitting: Medical

## 2015-04-28 MED ORDER — PANTOPRAZOLE SODIUM 40 MG PO TBEC: DELAYED_RELEASE_TABLET | ORAL | Status: DC

## 2015-04-28 NOTE — Telephone Encounter (Signed)
Insurance declined [employer].  I sent the preferred Pantoprazole as an alternate to take when he runs out of Dexilant

## 2015-04-28 NOTE — Telephone Encounter (Signed)
Left on voicemail

## 2015-05-29 ENCOUNTER — Encounter: Payer: Self-pay | Admitting: Cardiology

## 2015-05-29 ENCOUNTER — Other Ambulatory Visit: Payer: Self-pay | Admitting: Cardiology

## 2015-05-29 ENCOUNTER — Inpatient Hospital Stay (HOSPITAL_COMMUNITY)
Admission: AD | Admit: 2015-05-29 | Discharge: 2015-06-06 | DRG: 231 | Disposition: A | Discharge status: Home / Self Care | Payer: BLUE CROSS/BLUE SHIELD | Source: Ambulatory Visit | Attending: Surgery | Admitting: Surgery

## 2015-05-29 ENCOUNTER — Encounter (HOSPITAL_COMMUNITY)
Admission: AD | Disposition: A | Discharge status: Home / Self Care | Payer: Self-pay | Source: Ambulatory Visit | Attending: Surgery

## 2015-05-29 ENCOUNTER — Encounter (HOSPITAL_COMMUNITY): Payer: Self-pay | Admitting: Emergency Medicine

## 2015-05-29 ENCOUNTER — Observation Stay (HOSPITAL_COMMUNITY): Payer: BLUE CROSS/BLUE SHIELD

## 2015-05-29 ENCOUNTER — Other Ambulatory Visit: Payer: Self-pay | Admitting: *Deleted

## 2015-05-29 DIAGNOSIS — K219 Gastro-esophageal reflux disease without esophagitis: Secondary | ICD-10-CM | POA: Diagnosis present

## 2015-05-29 DIAGNOSIS — D62 Acute posthemorrhagic anemia: Secondary | ICD-10-CM | POA: Diagnosis not present

## 2015-05-29 DIAGNOSIS — I200 Unstable angina: Secondary | ICD-10-CM | POA: Diagnosis present

## 2015-05-29 DIAGNOSIS — I25110 Atherosclerotic heart disease of native coronary artery with unstable angina pectoris: Principal | ICD-10-CM | POA: Diagnosis present

## 2015-05-29 DIAGNOSIS — E663 Overweight: Secondary | ICD-10-CM | POA: Diagnosis present

## 2015-05-29 DIAGNOSIS — F17210 Nicotine dependence, cigarettes, uncomplicated: Secondary | ICD-10-CM | POA: Diagnosis present

## 2015-05-29 DIAGNOSIS — E7439 Other disorders of intestinal carbohydrate absorption: Secondary | ICD-10-CM | POA: Diagnosis present

## 2015-05-29 DIAGNOSIS — I2510 Atherosclerotic heart disease of native coronary artery without angina pectoris: Secondary | ICD-10-CM

## 2015-05-29 DIAGNOSIS — E119 Type 2 diabetes mellitus without complications: Secondary | ICD-10-CM | POA: Diagnosis present

## 2015-05-29 DIAGNOSIS — F419 Anxiety disorder, unspecified: Secondary | ICD-10-CM | POA: Diagnosis present

## 2015-05-29 DIAGNOSIS — I10 Essential (primary) hypertension: Secondary | ICD-10-CM | POA: Diagnosis present

## 2015-05-29 DIAGNOSIS — I214 Non-ST elevation (NSTEMI) myocardial infarction: Secondary | ICD-10-CM | POA: Diagnosis present

## 2015-05-29 DIAGNOSIS — Z8489 Family history of other specified conditions: Secondary | ICD-10-CM | POA: Diagnosis not present

## 2015-05-29 DIAGNOSIS — E782 Mixed hyperlipidemia: Secondary | ICD-10-CM | POA: Diagnosis present

## 2015-05-29 DIAGNOSIS — Z8249 Family history of ischemic heart disease and other diseases of the circulatory system: Secondary | ICD-10-CM

## 2015-05-29 DIAGNOSIS — Z951 Presence of aortocoronary bypass graft: Secondary | ICD-10-CM

## 2015-05-29 HISTORY — PX: CARDIAC CATHETERIZATION: SHX172

## 2015-05-29 LAB — CBC WITH DIFFERENTIAL/PLATELET
BASOS ABS: 0 10*3/uL (ref 0.0–0.1)
Basophils Relative: 0 %
EOS ABS: 0.2 10*3/uL (ref 0.0–0.7)
EOS PCT: 2 %
HCT: 45.2 % (ref 39.0–52.0)
Hemoglobin: 15.5 g/dL (ref 13.0–17.0)
LYMPHS ABS: 2.7 10*3/uL (ref 0.7–4.0)
Lymphocytes Relative: 32 %
MCH: 29.5 pg (ref 26.0–34.0)
MCHC: 34.3 g/dL (ref 30.0–36.0)
MCV: 86.1 fL (ref 78.0–100.0)
MONO ABS: 0.8 10*3/uL (ref 0.1–1.0)
Monocytes Relative: 10 %
Neutro Abs: 4.8 10*3/uL (ref 1.7–7.7)
Neutrophils Relative %: 56 %
PLATELETS: 280 10*3/uL (ref 150–400)
RBC: 5.25 MIL/uL (ref 4.22–5.81)
RDW: 13.5 % (ref 11.5–15.5)
WBC: 8.5 10*3/uL (ref 4.0–10.5)

## 2015-05-29 LAB — COMPREHENSIVE METABOLIC PANEL
ALT: 31 U/L (ref 17–63)
AST: 27 U/L (ref 15–41)
Albumin: 4.4 g/dL (ref 3.5–5.0)
Alkaline Phosphatase: 61 U/L (ref 38–126)
Anion gap: 10 (ref 5–15)
BUN: 16 mg/dL (ref 6–20)
CHLORIDE: 105 mmol/L (ref 101–111)
CO2: 26 mmol/L (ref 22–32)
CREATININE: 0.82 mg/dL (ref 0.61–1.24)
Calcium: 9.4 mg/dL (ref 8.9–10.3)
GFR calc non Af Amer: >60 mL/min (ref >60)
Glucose, Bld: 105 mg/dL — ABNORMAL HIGH (ref 65–99)
POTASSIUM: 3.8 mmol/L (ref 3.5–5.1)
SODIUM: 141 mmol/L (ref 135–145)
Total Bilirubin: 1.2 mg/dL (ref 0.3–1.2)
Total Protein: 7.4 g/dL (ref 6.5–8.1)

## 2015-05-29 LAB — PROTIME-INR
INR: 1.03 (ref 0.00–1.49)
Prothrombin Time: 13.7 seconds (ref 11.6–15.2)

## 2015-05-29 LAB — POCT ACTIVATED CLOTTING TIME
ACTIVATED CLOTTING TIME: 286 s
Activated Clotting Time: 363 seconds

## 2015-05-29 LAB — MRSA PCR SCREENING
MRSA BY PCR: NEGATIVE
MRSA BY PCR: NEGATIVE

## 2015-05-29 LAB — SURGICAL PCR SCREEN
MRSA, PCR: NEGATIVE
Staphylococcus aureus: NEGATIVE

## 2015-05-29 LAB — TSH: TSH: 1.315 u[IU]/mL (ref 0.350–4.500)

## 2015-05-29 LAB — PLATELET COUNT: Platelets: 253 10*3/uL (ref 150–400)

## 2015-05-29 LAB — TROPONIN I
TROPONIN I: 0.49 ng/mL — AB (ref <0.031)
TROPONIN I: 1.09 ng/mL — AB (ref <0.031)
Troponin I: 0.95 ng/mL (ref <0.031)

## 2015-05-29 SURGERY — LEFT HEART CATH AND CORONARY ANGIOGRAPHY
Anesthesia: LOCAL

## 2015-05-29 MED ORDER — TIROFIBAN (AGGRASTAT) BOLUS VIA INFUSION
INTRAVENOUS | Status: DC | PRN
  Administered 2015-05-29: 2050 ug via INTRAVENOUS

## 2015-05-29 MED ORDER — ASPIRIN EC 81 MG PO TBEC: 81.0000 mg | DELAYED_RELEASE_TABLET | Freq: Every day | ORAL | Status: DC

## 2015-05-29 MED ORDER — NICOTINE 21 MG/24HR TD PT24
21.0000 mg | MEDICATED_PATCH | TRANSDERMAL | Status: DC
  Administered 2015-05-29 – 2015-06-01 (×4): 21 mg via TRANSDERMAL
  Filled 2015-05-29 (×4): qty 1

## 2015-05-29 MED ORDER — HEPARIN SODIUM (PORCINE) 1000 UNIT/ML IJ SOLN
INTRAMUSCULAR | Status: AC
  Filled 2015-05-29: qty 1

## 2015-05-29 MED ORDER — ASPIRIN EC 81 MG PO TBEC
81.0000 mg | DELAYED_RELEASE_TABLET | Freq: Every day | ORAL | Status: DC
Start: 2015-05-29 — End: 2015-06-02
  Administered 2015-05-30 – 2015-06-01 (×3): 81 mg via ORAL
  Filled 2015-05-29 (×3): qty 1

## 2015-05-29 MED ORDER — ASPIRIN 81 MG PO CHEW
81.0000 mg | CHEWABLE_TABLET | Freq: Every day | ORAL | Status: DC
Start: 2015-05-29 — End: 2015-05-29

## 2015-05-29 MED ORDER — IOHEXOL 350 MG/ML SOLN
INTRAVENOUS | Status: DC | PRN
  Administered 2015-05-29: 120 mL via INTRAVENOUS

## 2015-05-29 MED ORDER — TIROFIBAN (AGGRASTAT) BOLUS VIA INFUSION
25.0000 ug/kg | Freq: Once | INTRAVENOUS | Status: AC
  Filled 2015-05-29: qty 41

## 2015-05-29 MED ORDER — MIDAZOLAM HCL 2 MG/2ML IJ SOLN
INTRAMUSCULAR | Status: DC | PRN
  Administered 2015-05-29: 2 mg via INTRAVENOUS
  Administered 2015-05-29: 1 mg via INTRAVENOUS

## 2015-05-29 MED ORDER — SODIUM CHLORIDE 0.9 % IV SOLN
250.0000 mL | INTRAVENOUS | Status: DC | PRN
  Administered 2015-06-02: 13:00:00 via INTRAVENOUS

## 2015-05-29 MED ORDER — LIDOCAINE HCL (PF) 1 % IJ SOLN
INTRAMUSCULAR | Status: AC
  Filled 2015-05-29: qty 30

## 2015-05-29 MED ORDER — SODIUM CHLORIDE 0.9% FLUSH: 3.0000 mL | Freq: Two times a day (BID) | INTRAVENOUS | Status: DC

## 2015-05-29 MED ORDER — TIROFIBAN HCL IN NACL 5-0.9 MG/100ML-% IV SOLN
INTRAVENOUS | Status: AC
  Filled 2015-05-29: qty 100

## 2015-05-29 MED ORDER — TRAMADOL HCL 50 MG PO TABS
100.0000 mg | ORAL_TABLET | Freq: Three times a day (TID) | ORAL | Status: DC | PRN
  Administered 2015-05-29 – 2015-05-31 (×2): 100 mg via ORAL
  Filled 2015-05-29 (×2): qty 2

## 2015-05-29 MED ORDER — SODIUM CHLORIDE 0.9 % WEIGHT BASED INFUSION
1.0000 mL/kg/h | INTRAVENOUS | Status: AC
  Administered 2015-05-29: 1.01 mL/kg/h via INTRAVENOUS

## 2015-05-29 MED ORDER — SODIUM CHLORIDE 0.9 % WEIGHT BASED INFUSION: 3.0000 mL/kg/h | INTRAVENOUS | Status: DC

## 2015-05-29 MED ORDER — HEPARIN (PORCINE) IN NACL 100-0.45 UNIT/ML-% IJ SOLN
1000.0000 [IU]/h | INTRAMUSCULAR | Status: DC
  Administered 2015-05-29: 1000 [IU]/h via INTRAVENOUS
  Filled 2015-05-29: qty 250

## 2015-05-29 MED ORDER — ONDANSETRON HCL 4 MG/2ML IJ SOLN: 4.0000 mg | Freq: Four times a day (QID) | INTRAMUSCULAR | Status: DC | PRN

## 2015-05-29 MED ORDER — ALPRAZOLAM 0.5 MG PO TABS
0.5000 mg | ORAL_TABLET | Freq: Once | ORAL | Status: AC
  Administered 2015-05-29: 0.5 mg via ORAL
  Filled 2015-05-29: qty 1

## 2015-05-29 MED ORDER — SODIUM CHLORIDE 0.9 % WEIGHT BASED INFUSION: 1.0000 mL/kg/h | INTRAVENOUS | Status: DC

## 2015-05-29 MED ORDER — TIROFIBAN HCL IN NACL 5-0.9 MG/100ML-% IV SOLN
0.1500 ug/kg/min | INTRAVENOUS | Status: DC
  Administered 2015-05-29 – 2015-05-30 (×3): 0.15 ug/kg/min via INTRAVENOUS
  Filled 2015-05-29 (×3): qty 100

## 2015-05-29 MED ORDER — ROSUVASTATIN CALCIUM 10 MG PO TABS
10.0000 mg | ORAL_TABLET | Freq: Every day | ORAL | Status: DC
  Administered 2015-05-29 – 2015-06-05 (×7): 10 mg via ORAL
  Filled 2015-05-29 (×7): qty 1

## 2015-05-29 MED ORDER — ADENOSINE 12 MG/4ML IV SOLN
16.0000 mL | Freq: Once | INTRAVENOUS | Status: DC
  Filled 2015-05-29: qty 16

## 2015-05-29 MED ORDER — ADENOSINE (DIAGNOSTIC) 140MCG/KG/MIN
INTRAVENOUS | Status: DC | PRN
  Administered 2015-05-29: 140 ug/kg/min via INTRAVENOUS

## 2015-05-29 MED ORDER — VERAPAMIL HCL 2.5 MG/ML IV SOLN
INTRAVENOUS | Status: AC
  Filled 2015-05-29: qty 2

## 2015-05-29 MED ORDER — ASPIRIN 81 MG PO CHEW
81.0000 mg | CHEWABLE_TABLET | ORAL | Status: DC
  Filled 2015-05-29: qty 1

## 2015-05-29 MED ORDER — SODIUM CHLORIDE 0.9 % IV SOLN: 250.0000 mL | INTRAVENOUS | Status: DC | PRN

## 2015-05-29 MED ORDER — NITROGLYCERIN 1 MG/10 ML FOR IR/CATH LAB
INTRA_ARTERIAL | Status: DC | PRN
  Administered 2015-05-29: 200 ug via INTRACORONARY

## 2015-05-29 MED ORDER — ACETAMINOPHEN 325 MG PO TABS
650.0000 mg | ORAL_TABLET | ORAL | Status: DC | PRN
  Administered 2015-05-31: 650 mg via ORAL
  Filled 2015-05-29 (×3): qty 2

## 2015-05-29 MED ORDER — FENTANYL CITRATE (PF) 100 MCG/2ML IJ SOLN
INTRAMUSCULAR | Status: AC
  Filled 2015-05-29: qty 2

## 2015-05-29 MED ORDER — TERBINAFINE HCL 250 MG PO TABS: 250.0000 mg | ORAL_TABLET | Freq: Every day | ORAL | Status: DC

## 2015-05-29 MED ORDER — HEPARIN (PORCINE) IN NACL 2-0.9 UNIT/ML-% IJ SOLN
INTRAMUSCULAR | Status: AC
  Filled 2015-05-29: qty 1000

## 2015-05-29 MED ORDER — METOPROLOL TARTRATE 25 MG PO TABS
25.0000 mg | ORAL_TABLET | Freq: Two times a day (BID) | ORAL | Status: DC
  Administered 2015-05-29 – 2015-06-01 (×8): 25 mg via ORAL
  Filled 2015-05-29 (×8): qty 1

## 2015-05-29 MED ORDER — LIDOCAINE HCL (PF) 1 % IJ SOLN
INTRAMUSCULAR | Status: DC | PRN
  Administered 2015-05-29: 3 mL

## 2015-05-29 MED ORDER — TIROFIBAN HCL IN NACL 5-0.9 MG/100ML-% IV SOLN
INTRAVENOUS | Status: DC | PRN
  Administered 2015-05-29: 0.15 ug/kg/min via INTRAVENOUS

## 2015-05-29 MED ORDER — NIACIN ER 500 MG PO CPCR
500.0000 mg | ORAL_CAPSULE | Freq: Every day | ORAL | Status: DC
Start: 2015-05-29 — End: 2015-06-02
  Filled 2015-05-29 (×4): qty 1

## 2015-05-29 MED ORDER — MIDAZOLAM HCL 2 MG/2ML IJ SOLN
INTRAMUSCULAR | Status: AC
  Filled 2015-05-29: qty 2

## 2015-05-29 MED ORDER — VERAPAMIL HCL 2.5 MG/ML IV SOLN
INTRAVENOUS | Status: DC | PRN
  Administered 2015-05-29: 10 mL via INTRA_ARTERIAL

## 2015-05-29 MED ORDER — HEPARIN SODIUM (PORCINE) 1000 UNIT/ML IJ SOLN
INTRAMUSCULAR | Status: DC | PRN
  Administered 2015-05-29: 6000 [IU] via INTRAVENOUS
  Administered 2015-05-29 (×2): 4000 [IU] via INTRAVENOUS

## 2015-05-29 MED ORDER — SODIUM CHLORIDE 0.9% FLUSH: 3.0000 mL | INTRAVENOUS | Status: DC | PRN

## 2015-05-29 MED ORDER — FENTANYL CITRATE (PF) 100 MCG/2ML IJ SOLN
INTRAMUSCULAR | Status: DC | PRN
Start: 2015-05-29 — End: 2015-05-29
  Administered 2015-05-29: 25 ug via INTRAVENOUS
  Administered 2015-05-29: 50 ug via INTRAVENOUS

## 2015-05-29 MED ORDER — HEPARIN (PORCINE) IN NACL 100-0.45 UNIT/ML-% IJ SOLN
1200.0000 [IU]/h | INTRAMUSCULAR | Status: DC
  Administered 2015-05-29: 1000 [IU]/h via INTRAVENOUS
  Administered 2015-05-30: 1200 [IU]/h via INTRAVENOUS
  Filled 2015-05-29: qty 250

## 2015-05-29 MED ORDER — SODIUM CHLORIDE 0.9 % WEIGHT BASED INFUSION
1.0000 mL/kg/h | INTRAVENOUS | Status: DC
  Administered 2015-05-29: 1 mL/kg/h via INTRAVENOUS

## 2015-05-29 MED ORDER — SODIUM CHLORIDE 0.9% FLUSH
3.0000 mL | Freq: Two times a day (BID) | INTRAVENOUS | Status: DC
  Administered 2015-05-29 – 2015-06-01 (×7): 3 mL via INTRAVENOUS

## 2015-05-29 MED ORDER — TIROFIBAN HCL IN NACL 5-0.9 MG/100ML-% IV SOLN
0.1500 ug/kg/min | INTRAVENOUS | Status: DC
  Administered 2015-05-29: 0.15 ug/kg/min via INTRAVENOUS

## 2015-05-29 MED ORDER — ACETAMINOPHEN 325 MG PO TABS
650.0000 mg | ORAL_TABLET | ORAL | Status: DC | PRN
  Administered 2015-05-29 – 2015-05-30 (×3): 650 mg via ORAL
  Filled 2015-05-29: qty 2

## 2015-05-29 MED ORDER — HEPARIN (PORCINE) IN NACL 2-0.9 UNIT/ML-% IJ SOLN
INTRAMUSCULAR | Status: DC | PRN
  Administered 2015-05-29: 1000 mL

## 2015-05-29 MED ORDER — DM-GUAIFENESIN ER 30-600 MG PO TB12
1.0000 | ORAL_TABLET | Freq: Two times a day (BID) | ORAL | Status: DC
  Filled 2015-05-29 (×6): qty 1

## 2015-05-29 MED ORDER — HEPARIN BOLUS VIA INFUSION
4000.0000 [IU] | Freq: Once | INTRAVENOUS | Status: DC
  Filled 2015-05-29: qty 4000

## 2015-05-29 MED ORDER — NITROGLYCERIN 0.4 MG SL SUBL: 0.4000 mg | SUBLINGUAL_TABLET | SUBLINGUAL | Status: DC | PRN

## 2015-05-29 MED ORDER — PANTOPRAZOLE SODIUM 40 MG PO TBEC
40.0000 mg | DELAYED_RELEASE_TABLET | Freq: Every day | ORAL | Status: DC
  Administered 2015-05-29 – 2015-06-01 (×4): 40 mg via ORAL
  Filled 2015-05-29 (×4): qty 1

## 2015-05-29 SURGICAL SUPPLY — 20 items
BALLN EUPHORA RX 2.0X12 (BALLOONS) ×3
BALLOON EUPHORA RX 2.0X12 (BALLOONS) ×2 IMPLANT
CATH INFINITI 5 FR JL3.5 (CATHETERS) ×3 IMPLANT
CATH INFINITI 5FR ANG PIGTAIL (CATHETERS) ×3 IMPLANT
CATH INFINITI JR4 5F (CATHETERS) ×3 IMPLANT
CATH LAUNCHER 6FR 3DRIGHT (CATHETERS) ×2 IMPLANT
CATH MICROCATH NAVVUS (MICROCATHETER) ×2 IMPLANT
CATHETER LAUNCHER 6FR 3DRIGHT (CATHETERS) ×3
DEVICE RAD COMP TR BAND LRG (VASCULAR PRODUCTS) ×3 IMPLANT
GLIDESHEATH SLEND SS 6F .021 (SHEATH) ×3 IMPLANT
KIT ENCORE 26 ADVANTAGE (KITS) ×3 IMPLANT
KIT HEART LEFT (KITS) ×3 IMPLANT
MICROCATHETER NAVVUS (MICROCATHETER) ×3
PACK CARDIAC CATHETERIZATION (CUSTOM PROCEDURE TRAY) ×3 IMPLANT
SYR MEDRAD MARK V 150ML (SYRINGE) ×3 IMPLANT
TRANSDUCER W/STOPCOCK (MISCELLANEOUS) ×3 IMPLANT
TUBING CIL FLEX 10 FLL-RA (TUBING) ×3 IMPLANT
VALVE GUARDIAN II ~~LOC~~ HEMO (MISCELLANEOUS) ×3 IMPLANT
WIRE ASAHI PROWATER 180CM (WIRE) ×3 IMPLANT
WIRE SAFE-T 1.5MM-J .035X260CM (WIRE) ×3 IMPLANT

## 2015-05-29 NOTE — H&P (View-Only) (Signed)
Donald Oliver    Date of visit:  05/29/2015 DOB:  04/30/1970    Age:  45 yrs. Medical record number:  [id number]     Account number:  [id number] Primary Care Provider: Robyne Askew ____________________________ CURRENT DIAGNOSES  1. Unstable angina  2. Family history of ischemic heart disease and other diseases of the circulatory system  3. Essential (primary) hypertension  4. Overweight  5. Hyperlipidemia  6. Nicotine dependence, cigarettes, uncomplicated ____________________________ ALLERGIES  Amoxicillin, Swelling (non-specific) ____________________________ MEDICATIONS  1. aspirin 81 mg chewable tablet, 1 p.o. daily  2. lisinopril 20 mg-hydrochlorothiazide 25 mg tablet, 1 p.o. daily  3. Crestor 10 mg tablet, 1 p.o. daily  4. pantoprazole 40 mg tablet,delayed release, 1 p.o. daily ____________________________ CHIEF COMPLAINTS chest pain ____________________________ HISTORY OF PRESENT ILLNESS This 45 year old male is seen for evaluation of progressive chest discomfort. The patient has a history of tobacco abuse, a very strongly positive family history for early coronary artery disease, hypertension, and hyperlipidemia. He was seen by me in November of 2015 and had a negative treadmill test. Over the past month he has developed progressive anterior substernal tightness. The discomfort is described as a pressure and heaviness will radiate to the inner aspect of both arms. It has been present with exertion but has become increasingly frequent and had a lesser level of activity. He had a prolonged episode occurring at rest yesterday afternoon as well as some chest discomfort last evening. He called the office and did not want to go to the emergency room yesterday and was seen this morning and because of the progressive nature of symptoms is brought into the hospital for treatment and evaluation of unstable angina. Prior to month ago he was not having much in the way of symptoms. He has not  eaten anything since last night. He denies PND, orthopnea or edema. ____________________________ PAST HISTORY  Past Medical Illnesses:  hypertension, GERD, erectile dysfunction, hyperlipidemia;  Cardiovascular Illnesses:  no previous history of cardiac disease;  Infectious Diseases:  no previous history of significant infectious diseases;  Surgical Procedures:  deviated septum repair;  Trauma History:  no previous history of significant trauma;  NYHA Classification:  I;  Canadian Angina Classification:  Class 0: Asymptomatic;  Cardiology Procedures-Invasive:  no previous interventional or invasive cardiology procedures;  Cardiology Procedures-Noninvasive:  treadmill November 2015;  LVEF not documented,   ____________________________ CARDIO-PULMONARY TEST DATES EKG Date:  05/29/2015;   ____________________________ FAMILY HISTORY Father -- Father dead age 58, Coronary artery bypass grafting age 45, Hypercholesterolemia, Diabetes mellitus Mother -- Mother alive with problem, Hypertension Sister -- Sister dead, AIDS Sister -- Sister alive and well ____________________________ SOCIAL HISTORY Alcohol Use:  occasionally;  Smoking:  smokes cigarettes,, 25 pack year history;  Diet:  regular diet;  Lifestyle:  married, 1 daughter and 2 stepdaughters;  Exercise:  some exercise;  Occupation:  Oceanographer;  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  Integumentary:no rashes or new skin lesions. Eyes: denies diplopia, history of glaucoma or visual problems. Ears, Nose, Throat, Mouth:  denies any hearing loss, epistaxis, hoarseness or difficulty speaking. Respiratory: denies dyspnea, cough, wheezing or hemoptysis. Cardiovascular:  please review HPI Abdominal: dyspepsia Genitourinary-Male: no dysuria, urgency, frequency, or nocturia, erectile dysfunction  Musculoskeletal:   arthritis of the hands Neurological:  denies headaches, stroke, or TIA Psychiatric:  denies depression or anxiety  ____________________________ PHYSICAL EXAMINATION VITAL SIGNS  Blood Pressure:  126/70 Sitting, Right arm, regular cuff  , 132/78 Standing, Right arm and regular cuff   Pulse:  90/min. Weight:  188.00 lbs. Height:  69"BMI: 28  Constitutional:  pleasant white male in no acute distress, overweight Skin:  warm and dry to touch, no apparent skin lesions, or masses noted. Head:  normocephalic, normal hair pattern, no masses or tenderness Eyes:  EOMS Intact, PERRLA, C and S clear, Funduscopic exam not done. ENT:  ears, nose and throat reveal no gross abnormalities.  Dentition good. Neck:  supple, without massess. No JVD, thyromegaly or carotid bruits. Carotid upstroke normal. Chest:  normal symmetry, clear to auscultation. Cardiac:  regular rhythm, normal S1 and S2, No S3 or S4, no murmurs, gallops or rubs detected. Abdomen:  abdomen soft,non-tender, no masses, no hepatospenomegaly, or aneurysm noted Peripheral Pulses:  the femoral,dorsalis pedis, and posterior tibial pulses are full and equal bilaterally with no bruits auscultated. Neurological:  no gross motor or sensory deficits noted, affect appropriate, oriented x3. ____________________________ MOST RECENT LIPID PANEL 02/12/15  CHOL TOTL 171 mg/dl, LDL 71 NM, HDL 38 mg/dl, TRIGLYCER 312 mg/dl and CHOL/HDL 4.5 (Calc) ____________________________ IMPRESSIONS/PLAN  1. Unstable angina pectoris 2. Strong family history of premature cardiac disease 3. Tobacco abuse with continued smoking advised to stop 4. Hyperlipidemia under treatment 5. Hypertension 6. Glucose intolerance  Recommendations:  He will be admitted to the hospital. He will be started on intravenous heparin. He is n.p.o. and we'll plan catheterization later this afternoon through the radial approach by a physician with [facility]. Obtain lab work.  Cardiac catheterization was discussed with the patient including risks of myocardial infarction, death, stroke, bleeding, arrhythmia, dye allergy, or renal insufficiency. He understands and is willing to proceed. Possibility of percutaneous intervention at the same setting was also discussed with the patient including risks.  EKG is rnormal today.                       ____________________________ Cardiology Physician:  Kerry Hough MD [facility]

## 2015-05-29 NOTE — Interval H&P Note (Signed)
Cath Lab Visit (complete for each Cath Lab visit)  Clinical Evaluation Leading to the Procedure:   ACS: Yes.    Non-ACS:    Anginal Classification: CCS IV  Anti-ischemic medical therapy: Minimal Therapy (1 class of medications)  Non-Invasive Test Results: No non-invasive testing performed  Prior CABG: No previous CABG  Patient currently having 3/10 pain in his chest.     History and Physical Interval Note:  05/29/2015 2:08 PM  Donald Oliver  has presented today for surgery, with the diagnosis of unsatble angina  The various methods of treatment have been discussed with the patient and family. After consideration of risks, benefits and other options for treatment, the patient has consented to  Procedure(s): Left Heart Cath and Coronary Angiography (N/A) as a surgical intervention .  The patient's history has been reviewed, patient examined, no change in status, stable for surgery.  I have reviewed the patient's chart and labs.  Questions were answered to the patient's satisfaction.     Holliday Sheaffer S.

## 2015-05-29 NOTE — Progress Notes (Signed)
Spoke to patient and wife at bedside. Discussed his coronary anatomy. Discussed options of a complex PCI involving the diagonal/LAD versus bypass grafting with return conduits. We'll await cardiac surgical opinion. I would lean toward bypass grafting if it can be done with arterial conduits to the LAD diagonal system and to the right coronary artery. [facility] care will see over weekend.  Kerry Hough. MD [facility] 05/29/2015 4:48 PM   .

## 2015-05-29 NOTE — Progress Notes (Signed)
CRITICAL VALUE ALERT  Critical value received:  Troponin 1.09  Date of notification:  05/29/2015  Time of notification:  1938  Critical value read back:Yes.    Nurse who received alert:  Hadassah Pais RN   Pt post cath with angioplasty expected lab value. Prior value also positive.

## 2015-05-29 NOTE — Progress Notes (Signed)
[facility] for heparin Indication: chest pain/ACS  Allergies  Allergen Reactions  . Amoxicillin     Patient Measurements:   Heparin Dosing Weight: ~82kg  Vital Signs: Temp: 98.1 F (36.7 C) (03/03 1149) Temp Source: Oral (03/03 1149) BP: 122/75 mmHg (03/03 1541) Pulse Rate: 0 (03/03 1551)  Labs:  Recent Labs  05/29/15 1115 05/29/15 1308  HGB 15.5  --   HCT 45.2  --   PLT 280  --   LABPROT  --  13.7  INR  --  1.03  CREATININE 0.82  --   TROPONINI 0.49*  --     CrCl cannot be calculated (Unknown ideal weight.).   Medical History: Past Medical History  Diagnosis Date  . FH: premature coronary heart disease     Father had MI at age 56   . History of substance abuse     Pt had abused amphetamines : quit in 2005  . Hypertension 2005  . GERD (gastroesophageal reflux disease)   . Combined hyperlipidemia   . Impaired fasting blood sugar 2016  . Onychomycosis   . Normal cardiac stress test 01/2014    Dr. Wynonia Lawman  . Anginal pain (HCC)     Assessment: 45 yom with CP x several weeks, to go to cath lab this afternoon. Pharmacy consulted to dose heparin for ACS. No anticoagulation noted on med rec pta. CBC wnl, no bleed documented. Patient now s/p cath and plan is to restart heparin drip 8 hours post sheath removal. 15:49:00 05/29/15 Post Cath / Sheath 05/29/15 Right Arterial;Radial Removed     Goal of Therapy:  Heparin level 0.3-0.7 units/ml Monitor platelets by anticoagulation protocol: Yes   Plan:  Restart heparin 8 hours post sheath removal at 1000 units/h Check a 6 hour heparin level Daily heparin level/CBC Mon s/sx bleeding  Thank you for allowing [place] to participate in this patients care. Jens Som, PharmD Pager: [phone]  05/29/2015 4:14 PM

## 2015-05-29 NOTE — Progress Notes (Signed)
ANTICOAGULATION CONSULT NOTE - Initial Consult  Pharmacy Consult for heparin Indication: chest pain/ACS  Allergies  Allergen Reactions  . Amoxicillin     Patient Measurements:   Heparin Dosing Weight: ~82kg  Vital Signs: Temp: 98.1 F (36.7 C) (03/03 1149) Temp Source: Oral (03/03 1149) BP: 136/91 mmHg (03/03 1149) Pulse Rate: 66 (03/03 1149)  Labs:  Recent Labs  05/29/15 1115  HGB 15.5  HCT 45.2  PLT 280    CrCl cannot be calculated (Unknown ideal weight.).   Medical History: Past Medical History  Diagnosis Date  . FH: premature coronary heart disease     Father had MI at age 80   . History of substance abuse     Pt had abused amphetamines : quit in 2005  . Smoker   . Hypertension 2005  . GERD (gastroesophageal reflux disease)   . Combined hyperlipidemia   . Impaired fasting blood sugar 2016  . Onychomycosis   . Normal cardiac stress test 01/2014    Dr. Wynonia Lawman    Assessment: 45 yom with CP x several weeks, to go to cath lab this afternoon. Pharmacy consulted to dose heparin for ACS. No anticoagulation noted on med rec pta. CBC wnl, no bleed documented.  Goal of Therapy:  Heparin level 0.3-0.7 units/ml Monitor platelets by anticoagulation protocol: Yes   Plan:  Heparin 4000 unit bolus Heparin at 1000 units/h 6h HL vs. cath Daily HL/CBC Mon s/sx bleeding  Elicia Lamp, PharmD, [facility] Clinical Pharmacist Pager [phone] 05/29/2015 12:04 PM

## 2015-05-29 NOTE — ED Notes (Signed)
Pt came from waiting room and RN gave report to RN getting pt on [facility] 03.

## 2015-05-29 NOTE — ED Notes (Signed)
Pt sent here from Dr Wynonia Lawman office; per Dr Wynonia Lawman pt does not need further workup than orders placed and is to go to cath lab this afternoon; pt was to be direct admit but no beds

## 2015-05-29 NOTE — Progress Notes (Signed)
Patient was seen in the cath lab and case discussed with Dr. Irish Lack.  The patient had significant LAD disease that was verified with an FFR of 0.66.  He also had a severe diagonal stenosis and had chest pain at the start of the catheterization.  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  Circumflex had at least moderate disease proximally.  He had angioplasty of the diagonal when the flow became compromised and he now is good flow and is pain free.  At this point I felt best to consult with cardiac surgeon as the diagonal LAD would require extensive treatment because of the presence of ostial disease in the diagonal.  At this point he will be placed on a IIb IIIa inhibitor and will get surgery consultation.  Would like to try to have bypass done with arterial conduits at least to the right coronary artery and to the LAD.  It would be nice there could be a sequential graft to the diagonal LAD.  Alternatively a complex stenting procedure the LAD diagonal could be entertained but this would leave him with incomplete revascularization.  Plan to discuss with the patient and wife when they returned to the room.  Kerry Hough. MD [facility] 05/29/2015 3:54 PM

## 2015-05-29 NOTE — ED Notes (Signed)
Pt here with increasing CP x several weeks; pt with admission orders from Dr Wynonia Lawman; pt needs to NPO

## 2015-05-29 NOTE — Progress Notes (Signed)
MD oncall, Dr. Oval Linsey came by to speak with pt at his request. Pt states " I was told I didn't have a heart attack" and refused to take Metoprolol initially. After MD spoke with pt he agreed to take Metoprolol. Pt appears very anxious and complains of wrist soreness. New orders obtained. Will continue to monitor.

## 2015-05-29 NOTE — H&P (Signed)
Donald Oliver    Date of visit:  05/29/2015 DOB:  [birth year]    Age:  45 yrs. Medical record number:  [id number]     Account number:  [id number] Primary Care Provider: Robyne Askew ____________________________ CURRENT DIAGNOSES  1. Unstable angina  2. Family history of ischemic heart disease and other diseases of the circulatory system  3. Essential (primary) hypertension  4. Overweight  5. Hyperlipidemia  6. Nicotine dependence, cigarettes, uncomplicated ____________________________ ALLERGIES  Amoxicillin, Swelling (non-specific) ____________________________ MEDICATIONS  1. aspirin 81 mg chewable tablet, 1 p.o. daily  2. lisinopril 20 mg-hydrochlorothiazide 25 mg tablet, 1 p.o. daily  3. Crestor 10 mg tablet, 1 p.o. daily  4. pantoprazole 40 mg tablet,delayed release, 1 p.o. daily ____________________________ CHIEF COMPLAINTS chest pain ____________________________ HISTORY OF PRESENT ILLNESS This 45 year old male is seen for evaluation of progressive chest discomfort. The patient has a history of tobacco abuse, a very strongly positive family history for early coronary artery disease, hypertension, and hyperlipidemia. He was seen by me in November of 2015 and had a negative treadmill test. Over the past month he has developed progressive anterior substernal tightness. The discomfort is described as a pressure and heaviness will radiate to the inner aspect of both arms. It has been present with exertion but has become increasingly frequent and had a lesser level of activity. He had a prolonged episode occurring at rest yesterday afternoon as well as some chest discomfort last evening. He called the office and did not want to go to the emergency room yesterday and was seen this morning and because of the progressive nature of symptoms is brought into the hospital for treatment and evaluation of unstable angina. Prior to month ago he was not having much in the way of symptoms. He has not  eaten anything since last night. He denies PND, orthopnea or edema. ____________________________ PAST HISTORY  Past Medical Illnesses:  hypertension, GERD, erectile dysfunction, hyperlipidemia;  Cardiovascular Illnesses:  no previous history of cardiac disease;  Infectious Diseases:  no previous history of significant infectious diseases;  Surgical Procedures:  deviated septum repair;  Trauma History:  no previous history of significant trauma;  NYHA Classification:  I;  Canadian Angina Classification:  Class 0: Asymptomatic;  Cardiology Procedures-Invasive:  no previous interventional or invasive cardiology procedures;  Cardiology Procedures-Noninvasive:  treadmill November 2015;  LVEF not documented,   ____________________________ CARDIO-PULMONARY TEST DATES EKG Date:  05/29/2015;   ____________________________ FAMILY HISTORY Father -- Father dead age 36, Coronary artery bypass grafting age 23, Hypercholesterolemia, Diabetes mellitus Mother -- Mother alive with problem, Hypertension Sister -- Sister dead, AIDS Sister -- Sister alive and well ____________________________ SOCIAL HISTORY Alcohol Use:  occasionally;  Smoking:  smokes cigarettes,, 25 pack year history;  Diet:  regular diet;  Lifestyle:  married, 1 daughter and 2 stepdaughters;  Exercise:  some exercise;  Occupation:  Oceanographer;  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  Integumentary:no rashes or new skin lesions. Eyes: denies diplopia, history of glaucoma or visual problems. Ears, Nose, Throat, Mouth:  denies any hearing loss, epistaxis, hoarseness or difficulty speaking. Respiratory: denies dyspnea, cough, wheezing or hemoptysis. Cardiovascular:  please review HPI Abdominal: dyspepsia Genitourinary-Male: no dysuria, urgency, frequency, or nocturia, erectile dysfunction  Musculoskeletal:   arthritis of the hands Neurological:  denies headaches, stroke, or TIA Psychiatric:  denies depression or anxiety  ____________________________ PHYSICAL EXAMINATION VITAL SIGNS  Blood Pressure:  126/70 Sitting, Right arm, regular cuff  , 132/78 Standing, Right arm and regular cuff   Pulse:  90/min. Weight:  188.00 lbs. Height:  69"BMI: 28  Constitutional:  pleasant white male in no acute distress, overweight Skin:  warm and dry to touch, no apparent skin lesions, or masses noted. Head:  normocephalic, normal hair pattern, no masses or tenderness Eyes:  EOMS Intact, PERRLA, C and S clear, Funduscopic exam not done. ENT:  ears, nose and throat reveal no gross abnormalities.  Dentition good. Neck:  supple, without massess. No JVD, thyromegaly or carotid bruits. Carotid upstroke normal. Chest:  normal symmetry, clear to auscultation. Cardiac:  regular rhythm, normal S1 and S2, No S3 or S4, no murmurs, gallops or rubs detected. Abdomen:  abdomen soft,non-tender, no masses, no hepatospenomegaly, or aneurysm noted Peripheral Pulses:  the femoral,dorsalis pedis, and posterior tibial pulses are full and equal bilaterally with no bruits auscultated. Neurological:  no gross motor or sensory deficits noted, affect appropriate, oriented x3. ____________________________ MOST RECENT LIPID PANEL 02/12/15  CHOL TOTL 171 mg/dl, LDL 71 NM, HDL 38 mg/dl, TRIGLYCER 312 mg/dl and CHOL/HDL 4.5 (Calc) ____________________________ IMPRESSIONS/PLAN  1. Unstable angina pectoris 2. Strong family history of premature cardiac disease 3. Tobacco abuse with continued smoking advised to stop 4. Hyperlipidemia under treatment 5. Hypertension 6. Glucose intolerance  Recommendations:  He will be admitted to the hospital. He will be started on intravenous heparin. He is n.p.o. and we'll plan catheterization later this afternoon through the radial approach by a physician with [facility]. Obtain lab work.  Cardiac catheterization was discussed with the patient including risks of myocardial infarction, death, stroke, bleeding, arrhythmia, dye allergy, or renal insufficiency. He understands and is willing to proceed. Possibility of percutaneous intervention at the same setting was also discussed with the patient including risks.  EKG is rnormal today.                       ____________________________ Cardiology Physician:  Kerry Hough MD [facility]

## 2015-05-30 DIAGNOSIS — I25110 Atherosclerotic heart disease of native coronary artery with unstable angina pectoris: Secondary | ICD-10-CM

## 2015-05-30 LAB — CBC
HEMATOCRIT: 40.2 % (ref 39.0–52.0)
Hemoglobin: 14 g/dL (ref 13.0–17.0)
MCH: 30.1 pg (ref 26.0–34.0)
MCHC: 34.8 g/dL (ref 30.0–36.0)
MCV: 86.5 fL (ref 78.0–100.0)
Platelets: 276 10*3/uL (ref 150–400)
RBC: 4.65 MIL/uL (ref 4.22–5.81)
RDW: 13.9 % (ref 11.5–15.5)
WBC: 9.2 10*3/uL (ref 4.0–10.5)

## 2015-05-30 LAB — HEPARIN LEVEL (UNFRACTIONATED)
Heparin Unfractionated: 0.15 IU/mL — ABNORMAL LOW (ref 0.30–0.70)
Heparin Unfractionated: 0.18 IU/mL — ABNORMAL LOW (ref 0.30–0.70)

## 2015-05-30 LAB — BASIC METABOLIC PANEL
Anion gap: 13 (ref 5–15)
BUN: 10 mg/dL (ref 6–20)
CHLORIDE: 106 mmol/L (ref 101–111)
CO2: 21 mmol/L — AB (ref 22–32)
CREATININE: 0.78 mg/dL (ref 0.61–1.24)
Calcium: 8.7 mg/dL — ABNORMAL LOW (ref 8.9–10.3)
GFR calc Af Amer: >60 mL/min (ref >60)
GFR calc non Af Amer: >60 mL/min (ref >60)
Glucose, Bld: 105 mg/dL — ABNORMAL HIGH (ref 65–99)
Potassium: 3.3 mmol/L — ABNORMAL LOW (ref 3.5–5.1)
Sodium: 140 mmol/L (ref 135–145)

## 2015-05-30 LAB — TROPONIN I: Troponin I: 0.44 ng/mL — ABNORMAL HIGH (ref <0.031)

## 2015-05-30 MED ORDER — HEPARIN (PORCINE) IN NACL 100-0.45 UNIT/ML-% IJ SOLN
1650.0000 [IU]/h | INTRAMUSCULAR | Status: DC
  Administered 2015-05-31: 1500 [IU]/h via INTRAVENOUS
  Administered 2015-06-01 (×2): 1650 [IU]/h via INTRAVENOUS
  Filled 2015-05-30 (×4): qty 250

## 2015-05-30 NOTE — Progress Notes (Addendum)
ANTICOAGULATION CONSULT NOTE - Follow Up Consult  Pharmacy Consult for Heparin Indication: chest pain/ACS  Allergies  Allergen Reactions  . Amoxicillin Swelling    Hand swelling - as a teenager Has patient had a PCN reaction causing immediate rash, facial/tongue/throat swelling, SOB or lightheadedness with hypotension: Yes Has patient had a PCN reaction causing severe rash involving mucus membranes or skin necrosis: No Has patient had a PCN reaction that required hospitalization No Has patient had a PCN reaction occurring within the last 10 years: No If all of the above answers are "NO", then may proceed with Cephalosporin use.    Patient Measurements: Height: 5\' 9"  (175.3 cm) Weight: 179 lb 0.2 oz (81.2 kg) IBW/kg (Calculated) : 70.7  Vital Signs: Temp: 97.9 F (36.6 C) (03/04 1200) Temp Source: Oral (03/04 1200) BP: 111/83 mmHg (03/04 1200) Pulse Rate: 58 (03/04 1300)  Labs:  Recent Labs  05/29/15 1115 05/29/15 1308 05/29/15 1835 05/29/15 2142 05/30/15 0412 05/30/15 0822 05/30/15 0827  HGB 15.5  --   --   --  14.0  --   --   HCT 45.2  --   --   --  40.2  --   --   PLT 280  --   --  253 276  --   --   LABPROT  --  13.7  --   --   --   --   --   INR  --  1.03  --   --   --   --   --   HEPARINUNFRC  --   --   --   --   --  0.15*  --   CREATININE 0.82  --   --   --  0.78  --   --   TROPONINI 0.49*  --  1.09* 0.95*  --   --  0.44*    Estimated Creatinine Clearance: 116.6 mL/min (by C-G formula based on Cr of 0.78).   Assessment: 73 YOM admitted with NSTEMI. S/P PCI w/ severe 3v disease. Pt started on tirofiban in the cath lab (x 18 hours after). Pharmacy consulted to dose IV heparin. Plans for CABG on Tuesday.   Initial HL subtherapeutic @ 0.15 on 1000 units/hr. Will increase rate (no bolus since tirofiban just turned off)  Hgb 14, PLT 276   Goal of Therapy:  Heparin level 0.3-0.7 units/ml Monitor platelets by anticoagulation protocol: Yes   Plan:   Increase heparin to1200 units/hr (no bolus)  6hr HL  Mon s/sx bleeding  Meagan C. Lennox Grumbles, PharmD Pharmacy Resident  Pager: [phone] 05/30/2015 2:15 PM   Addendum: Heparin level remains below goal at 0.18. Increase rate to 1500 units/hr and follow up AM labs.  Nena Jordan, PharmD, BCPS 05/30/2015, 9:52 PM

## 2015-05-30 NOTE — Progress Notes (Signed)
Patient ID: Donald Oliver, male   DOB: 1971-03-09, 45 y.o.   MRN: [id number]    Patient Name: Donald Oliver Date of Encounter: 05/30/2015     Active Problems:   Unstable angina pectoris [facility])   NSTEMI (non-ST elevated myocardial infarction) ([place])    SUBJECTIVE  Denies chest pain or sob.  CURRENT MEDS . aspirin EC  81 mg Oral Daily  . dextromethorphan-guaiFENesin  1 tablet Oral BID  . metoprolol tartrate  25 mg Oral BID  . niacin  500 mg Oral QHS  . nicotine  21 mg Transdermal Q24H  . pantoprazole  40 mg Oral Daily  . rosuvastatin  10 mg Oral q1800  . sodium chloride flush  3 mL Intravenous Q12H    OBJECTIVE  Filed Vitals:   05/30/15 0900 05/30/15 1000 05/30/15 1100 05/30/15 1200  BP: 123/77 117/76 113/84 111/83  Pulse: 61 63 65 53  Temp:    97.9 F (36.6 C)  TempSrc:    Oral  Resp: 23 19 17 14   Height:      Weight:      SpO2: 95% 98% 96% 96%    Intake/Output Summary (Last 24 hours) at 05/30/15 1327 Last data filed at 05/30/15 1300  Gross per 24 hour  Intake 1394.58 ml  Output   1450 ml  Net -55.42 ml   Filed Weights   05/29/15 1600  Weight: 179 lb 0.2 oz (81.2 kg)    PHYSICAL EXAM  General: Pleasant, well appearing middle aged man, NAD. Neuro: Alert and oriented X 3. Moves all extremities spontaneously. Psych: Normal affect. HEENT:  Normal  Neck: Supple without bruits or JVD. Lungs:  Resp regular and unlabored, CTA. Heart: RRR no s3, s4, or murmurs. Abdomen: Soft, non-tender, non-distended, BS + x 4.  Extremities: No clubbing, cyanosis or edema. DP/PT/Radials 2+ and equal bilaterally.  Accessory Clinical Findings  CBC  Recent Labs  05/29/15 1115 05/29/15 2142 05/30/15 0412  WBC 8.5  --  9.2  NEUTROABS 4.8  --   --   HGB 15.5  --  14.0  HCT 45.2  --  40.2  MCV 86.1  --  86.5  PLT 280 253 [id number]   Basic Metabolic Panel  Recent Labs  05/29/15 1115 05/30/15 0412  NA 141 140  K 3.8 3.3*  CL 105 106  CO2 26 21*  GLUCOSE 105* 105*    BUN 16 10  CREATININE 0.82 0.78  CALCIUM 9.4 8.7*   Liver Function Tests  Recent Labs  05/29/15 1115  AST 27  ALT 31  ALKPHOS 61  BILITOT 1.2  PROT 7.4  ALBUMIN 4.4   No results for input(s): LIPASE, AMYLASE in the last 72 hours. Cardiac Enzymes  Recent Labs  05/29/15 1835 05/29/15 2142 05/30/15 0827  TROPONINI 1.09* 0.95* 0.44*   BNP Invalid input(s): POCBNP D-Dimer No results for input(s): DDIMER in the last 72 hours. Hemoglobin A1C No results for input(s): HGBA1C in the last 72 hours. Fasting Lipid Panel No results for input(s): CHOL, HDL, LDLCALC, TRIG, CHOLHDL, LDLDIRECT in the last 72 hours. Thyroid Function Tests  Recent Labs  05/29/15 1115  TSH 1.315    TELE  nsr  Radiology/Studies  X-ray Chest Pa And Lateral  05/29/2015  CLINICAL DATA:  Unstable angina. EXAM: CHEST  2 VIEW COMPARISON:  None. FINDINGS: The heart size and mediastinal contours are within normal limits. Both lungs are clear. No pneumothorax or pleural effusion is noted. The visualized skeletal structures are unremarkable.  IMPRESSION: No active cardiopulmonary disease. Electronically Signed   By: Marijo Conception, M.D.   On: 05/29/2015 13:50    ASSESSMENT AND PLAN  1. 3 vessel CAD - note results of cath and plans for CABG on Tuesday. 2. Tobacco abuse - I discussed the importance of smoking cessation. 3. NSTEMI - no additional chest pain.  Gregg Taylor,M.D.  05/30/2015 1:27 PM

## 2015-05-30 NOTE — Progress Notes (Signed)
Requested clarification from B Broadlands, PA on if/when aggrastat should be discontinued.  MD note from yesterday indicates aggrastat may be continued until surgery, but initial order indicates a stop time for today.

## 2015-05-30 NOTE — Progress Notes (Signed)
CARDIAC REHAB PHASE I   PRE:  Rate/Rhythm: 8 SB  BP:  Supine: 113/84  Sitting:   Standing:    SaO2: 98%RA  MODE:  Ambulation: 350 ft   POST:  Rate/Rhythm: 66 SR  BP:  Supine:   Sitting: 114/80  Standing:    SaO2: 100%RA 1130-1200 Discussed with pt and wife sternal precautions, importance of IS and mobility after surgery. Gave OHS booklet and care guide. Wrote down how to view pre op video. Discussed that pt will need someone with him 24/7 first week after discharge. Pt walked 350 ft on RA with steady gait and then to recliner. Stated it felt good to be up. C/o back hurting but no CP during walk. Stated chest with a little discomfort that he has been having but did not worsen with activity.    Graylon Good, RN BSN  05/30/2015 11:56 AM

## 2015-05-30 NOTE — Consult Note (Signed)
[address]       [place] [address]             [phone]      Cardiothoracic Surgery Consultation   Reason for Consult: Severe multi-vessel coronary artery disease Referring Physician: Dr. Juanell Fairly AHNAF CAPONI is an 45 y.o. male.  HPI:  The patient is a 45 year old smoker with a history of hypertension, hyperlipidemia, and premature coronary disease who had a negative treadmill stress test in 01/2014 and reports being in his usual state of health until about a month ago when he began having substernal tightness radiating to the inner part of both arms with exertion. It has become more frequent and has been occuring with less exertion. Prior to admission he had a prolonged episode at rest. He did not want to go to the ER so he toughed it out thinking that it might be reflux but when it became persistent he came to the ER. Cath yesterday afternoon showed severe 3 vessel CAD with the culprit appearing to be an occluded large first diagonal branch. This was opened with PTCA without stenting. There was also a 75% mid LAD stenosis with an FFR of 0.69. The LCX has 50% proximal stenosis. The RCA has diffuse disease and is totally occlude distally with left to right collat to the PDA and PL branches that look small but could be underfilled.   Past Medical History  Diagnosis Date  . FH: premature coronary heart disease     Father had MI at age 47   . History of substance abuse     Pt had abused amphetamines : quit in 2005  . Hypertension 2005  . GERD (gastroesophageal reflux disease)   . Combined hyperlipidemia   . Impaired fasting blood sugar 2016  . Onychomycosis   . Normal cardiac stress test 01/2014    Dr. Wynonia Lawman  . Anginal pain [facility])     Past Surgical History  Procedure Laterality Date  . Rhinoplasty  20065    w/repair of nasal fractuare; done at [facility] History  Problem Relation Age of Onset  . Heart failure Father   . Heart disease  Father 33    died of MI  . Diabetes Father   . HIV Sister     Social History:  reports that he has been smoking Cigarettes.  He has a 30 pack-year smoking history. He has never used smokeless tobacco. He reports that he drinks about 1.8 oz of alcohol per week. He reports that he uses illicit drugs (Marijuana) about once per week.  Allergies:  Allergies  Allergen Reactions  . Amoxicillin Swelling    Hand swelling - as a teenager Has patient had a PCN reaction causing immediate rash, facial/tongue/throat swelling, SOB or lightheadedness with hypotension: Yes Has patient had a PCN reaction causing severe rash involving mucus membranes or skin necrosis: No Has patient had a PCN reaction that required hospitalization No Has patient had a PCN reaction occurring within the last 10 years: No If all of the above answers are "NO", then may proceed with Cephalosporin use.    Medications:  I have reviewed the patient's current medications. Prior to Admission:  Prescriptions prior to admission  Medication Sig Dispense Refill Last Dose  . acetaminophen (TYLENOL) 500 MG tablet Take 1,000 mg by mouth every 6 (six) hours as needed for headache (pain).   few days ago  . aspirin (  ASPIRIN EC) 81 MG EC tablet Take 1 tablet (81 mg total) by mouth daily. Swallow whole. 90 tablet 3 05/29/2015 at Unknown time  . lisinopril-hydrochlorothiazide (PRINZIDE,ZESTORETIC) 20-25 MG tablet Take 1 tablet by mouth daily. 90 tablet 3 05/29/2015 at Unknown time  . pantoprazole (PROTONIX) 40 MG tablet 1 tablet daily po 45 min prior to breakfast (Patient taking differently: Take 40 mg by mouth daily before breakfast. Take 45 min prior to breakfast) 90 tablet 0 05/29/2015 at Unknown time  . rosuvastatin (CRESTOR) 10 MG tablet Take 1 tablet (10 mg total) by mouth daily. 90 tablet 3 05/29/2015 at Unknown time  . albuterol (PROVENTIL HFA;VENTOLIN HFA) 108 (90 Base) MCG/ACT inhaler Inhale 2 puffs into the lungs every 6 (six) hours as  needed for wheezing or shortness of breath. (Patient not taking: Reported on 05/29/2015) 1 Inhaler 1 Not Taking at Unknown time  . Dextromethorphan-Guaifenesin (MUCINEX DM MAXIMUM STRENGTH) 60-1200 MG TB12 Take 1 tablet by mouth 2 (two) times daily. (Patient not taking: Reported on 05/29/2015) 20 each 0 Not Taking at Unknown time  . niacin 500 MG CR capsule Take 1 capsule (500 mg total) by mouth at bedtime. (Patient not taking: Reported on 05/29/2015) 30 capsule 2 Not Taking at Unknown time  . nicotine (NICODERM CQ - DOSED IN MG/24 HOURS) 21 mg/24hr patch Place 1 patch (21 mg total) onto the skin daily. (Patient not taking: Reported on 04/01/2015) 28 patch 0 Not Taking at Unknown time  . sildenafil (VIAGRA) 50 MG tablet Take 1 tablet (50 mg total) by mouth daily as needed for erectile dysfunction. (Patient not taking: Reported on 02/25/2015) 2 tablet 0 Not Taking at Unknown time   Scheduled: . aspirin EC  81 mg Oral Daily  . dextromethorphan-guaiFENesin  1 tablet Oral BID  . metoprolol tartrate  25 mg Oral BID  . niacin  500 mg Oral QHS  . nicotine  21 mg Transdermal Q24H  . pantoprazole  40 mg Oral Daily  . rosuvastatin  10 mg Oral q1800  . sodium chloride flush  3 mL Intravenous Q12H   Continuous: . heparin 1,200 Units/hr (05/30/15 1845)   BEM:LJQGBE chloride, acetaminophen, acetaminophen, nitroGLYCERIN, ondansetron (ZOFRAN) IV, sodium chloride flush, traMADol Anti-infectives    Start     Dose/Rate Route Frequency Ordered Stop   05/30/15 1000  terbinafine (LAMISIL) tablet 250 mg  Status:  Discontinued     250 mg Oral Daily 05/29/15 1602 05/29/15 1743      Results for orders placed or performed during the hospital encounter of 05/29/15 (from the past 48 hour(s))  CBC WITH DIFFERENTIAL     Status: None   Collection Time: 05/29/15 11:15 AM  Result Value Ref Range   WBC 8.5 4.0 - 10.5 K/uL   RBC 5.25 4.22 - 5.81 MIL/uL   Hemoglobin 15.5 13.0 - 17.0 g/dL   HCT 45.2 39.0 - 52.0 %   MCV 86.1  78.0 - 100.0 fL   MCH 29.5 26.0 - 34.0 pg   MCHC 34.3 30.0 - 36.0 g/dL   RDW 13.5 11.5 - 15.5 %   Platelets 280 150 - 400 K/uL   Neutrophils Relative % 56 %   Neutro Abs 4.8 1.7 - 7.7 K/uL   Lymphocytes Relative 32 %   Lymphs Abs 2.7 0.7 - 4.0 K/uL   Monocytes Relative 10 %   Monocytes Absolute 0.8 0.1 - 1.0 K/uL   Eosinophils Relative 2 %   Eosinophils Absolute 0.2 0.0 - 0.7 K/uL  Basophils Relative 0 %   Basophils Absolute 0.0 0.0 - 0.1 K/uL  Comprehensive metabolic panel     Status: Abnormal   Collection Time: 05/29/15 11:15 AM  Result Value Ref Range   Sodium 141 135 - 145 mmol/L   Potassium 3.8 3.5 - 5.1 mmol/L   Chloride 105 101 - 111 mmol/L   CO2 26 22 - 32 mmol/L   Glucose, Bld 105 (H) 65 - 99 mg/dL   BUN 16 6 - 20 mg/dL   Creatinine, Ser 0.82 0.61 - 1.24 mg/dL   Calcium 9.4 8.9 - 10.3 mg/dL   Total Protein 7.4 6.5 - 8.1 g/dL   Albumin 4.4 3.5 - 5.0 g/dL   AST 27 15 - 41 U/L   ALT 31 17 - 63 U/L   Alkaline Phosphatase 61 38 - 126 U/L   Total Bilirubin 1.2 0.3 - 1.2 mg/dL   GFR calc non Af Amer >60 >60 mL/min   GFR calc Af Amer >60 >60 mL/min    Comment: (NOTE) The eGFR has been calculated using the CKD EPI equation. This calculation has not been validated in all clinical situations. eGFR's persistently <60 mL/min signify possible Chronic Kidney Disease.    Anion gap 10 5 - 15  Troponin I-(serum)     Status: Abnormal   Collection Time: 05/29/15 11:15 AM  Result Value Ref Range   Troponin I 0.49 (H) <0.031 ng/mL    Comment:        PERSISTENTLY INCREASED TROPONIN VALUES IN THE RANGE OF 0.04-0.49 ng/mL CAN BE SEEN IN:       -UNSTABLE ANGINA       -CONGESTIVE HEART FAILURE       -MYOCARDITIS       -CHEST TRAUMA       -ARRYHTHMIAS       -LATE PRESENTING MYOCARDIAL INFARCTION       -COPD   CLINICAL FOLLOW-UP RECOMMENDED.   TSH     Status: None   Collection Time: 05/29/15 11:15 AM  Result Value Ref Range   TSH 1.315 0.350 - 4.500 uIU/mL  MRSA PCR  Screening     Status: None   Collection Time: 05/29/15 12:30 PM  Result Value Ref Range   MRSA by PCR NEGATIVE NEGATIVE    Comment:        The GeneXpert MRSA Assay (FDA approved for NASAL specimens only), is one component of a comprehensive MRSA colonization surveillance program. It is not intended to diagnose MRSA infection nor to guide or monitor treatment for MRSA infections.   Protime-INR     Status: None   Collection Time: 05/29/15  1:08 PM  Result Value Ref Range   Prothrombin Time 13.7 11.6 - 15.2 seconds   INR 1.03 0.00 - 1.49  POCT Activated clotting time     Status: None   Collection Time: 05/29/15  3:09 PM  Result Value Ref Range   Activated Clotting Time 286 seconds  POCT Activated clotting time     Status: None   Collection Time: 05/29/15  3:32 PM  Result Value Ref Range   Activated Clotting Time 363 seconds  MRSA PCR Screening     Status: None   Collection Time: 05/29/15  4:30 PM  Result Value Ref Range   MRSA by PCR NEGATIVE NEGATIVE    Comment:        The GeneXpert MRSA Assay (FDA approved for NASAL specimens only), is one component of a comprehensive MRSA colonization surveillance program. It is  not intended to diagnose MRSA infection nor to guide or monitor treatment for MRSA infections.   Surgical pcr screen     Status: None   Collection Time: 05/29/15  4:36 PM  Result Value Ref Range   MRSA, PCR NEGATIVE NEGATIVE   Staphylococcus aureus NEGATIVE NEGATIVE    Comment:        The Xpert SA Assay (FDA approved for NASAL specimens in patients over 62 years of age), is one component of a comprehensive surveillance program.  Test performance has been validated by [facility] for patients greater than or equal to 2 year old. It is not intended to diagnose infection nor to guide or monitor treatment.   Troponin I-(serum)     Status: Abnormal   Collection Time: 05/29/15  6:35 PM  Result Value Ref Range   Troponin I 1.09 (HH) <0.031 ng/mL     Comment:        POSSIBLE MYOCARDIAL ISCHEMIA. SERIAL TESTING RECOMMENDED. CRITICAL RESULT CALLED TO, READ BACK BY AND VERIFIED WITH: Manuella Ghazi RN [phone] GREEN R   Troponin I (serum)     Status: Abnormal   Collection Time: 05/29/15  9:42 PM  Result Value Ref Range   Troponin I 0.95 (HH) <0.031 ng/mL    Comment:        POSSIBLE MYOCARDIAL ISCHEMIA. SERIAL TESTING RECOMMENDED. CRITICAL VALUE NOTED.  VALUE IS CONSISTENT WITH PREVIOUSLY REPORTED AND CALLED VALUE.   Platelet count     Status: None   Collection Time: 05/29/15  9:42 PM  Result Value Ref Range   Platelets 253 150 - 400 K/uL  CBC     Status: None   Collection Time: 05/30/15  4:12 AM  Result Value Ref Range   WBC 9.2 4.0 - 10.5 K/uL   RBC 4.65 4.22 - 5.81 MIL/uL   Hemoglobin 14.0 13.0 - 17.0 g/dL   HCT 40.2 39.0 - 52.0 %   MCV 86.5 78.0 - 100.0 fL   MCH 30.1 26.0 - 34.0 pg   MCHC 34.8 30.0 - 36.0 g/dL   RDW 13.9 11.5 - 15.5 %   Platelets 276 150 - 400 K/uL  Basic metabolic panel     Status: Abnormal   Collection Time: 05/30/15  4:12 AM  Result Value Ref Range   Sodium 140 135 - 145 mmol/L   Potassium 3.3 (L) 3.5 - 5.1 mmol/L   Chloride 106 101 - 111 mmol/L   CO2 21 (L) 22 - 32 mmol/L   Glucose, Bld 105 (H) 65 - 99 mg/dL   BUN 10 6 - 20 mg/dL   Creatinine, Ser 0.78 0.61 - 1.24 mg/dL   Calcium 8.7 (L) 8.9 - 10.3 mg/dL   GFR calc non Af Amer >60 >60 mL/min   GFR calc Af Amer >60 >60 mL/min    Comment: (NOTE) The eGFR has been calculated using the CKD EPI equation. This calculation has not been validated in all clinical situations. eGFR's persistently <60 mL/min signify possible Chronic Kidney Disease.    Anion gap 13 5 - 15  Heparin level (unfractionated)     Status: Abnormal   Collection Time: 05/30/15  8:22 AM  Result Value Ref Range   Heparin Unfractionated 0.15 (L) 0.30 - 0.70 IU/mL    Comment:        IF HEPARIN RESULTS ARE BELOW EXPECTED VALUES, AND PATIENT DOSAGE HAS BEEN CONFIRMED, SUGGEST  FOLLOW UP TESTING OF ANTITHROMBIN III LEVELS.   Troponin I     Status:  Abnormal   Collection Time: 05/30/15  8:27 AM  Result Value Ref Range   Troponin I 0.44 (H) <0.031 ng/mL    Comment:        PERSISTENTLY INCREASED TROPONIN VALUES IN THE RANGE OF 0.04-0.49 ng/mL CAN BE SEEN IN:       -UNSTABLE ANGINA       -CONGESTIVE HEART FAILURE       -MYOCARDITIS       -CHEST TRAUMA       -ARRYHTHMIAS       -LATE PRESENTING MYOCARDIAL INFARCTION       -COPD   CLINICAL FOLLOW-UP RECOMMENDED.     X-ray Chest Pa And Lateral  05/29/2015  CLINICAL DATA:  Unstable angina. EXAM: CHEST  2 VIEW COMPARISON:  None. FINDINGS: The heart size and mediastinal contours are within normal limits. Both lungs are clear. No pneumothorax or pleural effusion is noted. The visualized skeletal structures are unremarkable. IMPRESSION: No active cardiopulmonary disease. Electronically Signed   By: Marijo Conception, M.D.   On: 05/29/2015 13:50    Review of Systems  Constitutional: Negative for fever, chills, weight loss and malaise/fatigue.  HENT: Negative.   Eyes: Negative.   Respiratory: Negative for cough, sputum production and shortness of breath.   Cardiovascular: Positive for chest pain. Negative for palpitations, orthopnea, leg swelling and PND.  Gastrointestinal: Negative.   Genitourinary: Negative.   Musculoskeletal: Negative.   Skin: Negative.   Neurological: Negative.   Endo/Heme/Allergies: Negative.   Psychiatric/Behavioral: The patient is nervous/anxious.    Blood pressure 121/89, pulse 64, temperature 97.9 F (36.6 C), temperature source Oral, resp. rate 13, height '5\' 9"'  (1.753 m), weight 81.2 kg (179 lb 0.2 oz), SpO2 96 %. Physical Exam  Constitutional: He is oriented to person, place, and time. He appears well-developed and well-nourished.  HENT:  Head: Normocephalic and atraumatic.  Mouth/Throat: Oropharynx is clear and moist.  Eyes: EOM are normal. Pupils are equal, round, and reactive to  light.  Neck: Normal range of motion. Neck supple. No JVD present. No thyromegaly present.  Cardiovascular: Normal rate, regular rhythm, normal heart sounds and intact distal pulses.   No murmur heard. Respiratory: Effort normal and breath sounds normal. No respiratory distress. He has no rales.  GI: Soft. Bowel sounds are normal. He exhibits no distension and no mass. There is no tenderness.  Musculoskeletal: Normal range of motion. He exhibits no edema.  Lymphadenopathy:    He has no cervical adenopathy.  Neurological: He is alert and oriented to person, place, and time. He has normal strength. No cranial nerve deficit or sensory deficit.  Skin: Skin is warm and dry.  Psychiatric: He has a normal mood and affect.   Jettie Booze, MD (Primary)      Procedures    Coronary Balloon Angioplasty   Left Heart Cath and Coronary Angiography    Conclusion     Severe mid LAD disease (75%), FFR 0.69.  Prox RCA to Dist RCA lesion, 99% stenosed. Chronic total occlusion of the distal RCA with left to right collaterals.  Culprit lesion is occluded first diagonal. THis was treated successfully with a 2.0 x 12 balloon. There is a 25% residual stenosis in the treated area. THere is an untreated ostial lesion in this vessel as well.  Prox Cx lesion, 50% stenosed.  Ramus lesion, 75% stenosed. This is a small vessel.  The left ventricular systolic function is normal.  Complex patient with multi-vessel disease. Discussed extensively with Dr. Wynonia Lawman. Will  plan for evaluation with CVTS for CABG. Culprit vessel treated with PTCA, to restore flow. No Plavix given. IV tirofiban started as antiplatelet therapy. Could continue this until surgery depending on how he is feeling. Original order is for 18 hours.  He will need beta blocker, statin, and smoking cessation.  F/u with Dr. Wynonia Lawman.     Indications    NSTEMI (non-ST elevated myocardial infarction) ([place]) [I21.4 (ICD-10-CM)]     Technique and Indications    The risks, benefits, and details of the procedure were explained to the patient. The patient verbalized understanding and wanted to proceed. Informed written consent was obtained.  PROCEDURE TECHNIQUE: After Xylocaine anesthesia a 10F slender sheath was placed in the right radial artery with a single anterior needle wall stick. IV Heparin was given. Right coronary angiography was done using a Judkins R4 guide catheter. Left coronary angiography was done using a an EBU 3 guide catheter. Left ventriculography was done using a pigtail catheter. A TR band was used for hemostasis.  Prowater was placed in teh LAD. FFR was done with baseline being 0.86. After maximal hyperemia with IV adenosine, FFR decreased to 0.69, which is significant.  IV heparin was given. ACT was used to check that the anticoagulation was therapeutic. A prowater wire was placed across the occluded first diagonal. The lesion was dilated with 2.0 x 12 balloon with several inflations. TIMI 3 flow was restored. The patient was pain free.   Contrast: 120 cc  During this procedure the patient is administered a total of Versed 3 mg and Fentanyl 75 mg to achieve and maintain moderate conscious sedation. The patient's heart rate, blood pressure, and oxygen saturation are monitored continuously during the procedure. The period of conscious sedation is 80 minutes, of which I was present face-to-face 100% of this time.   Estimated blood loss <50 mL. There were no immediate complications during the procedure.    Coronary Findings    Dominance: Right   Left Anterior Descending   . Mid LAD-1 lesion, 75% stenosed. Pressure wire/FFR was performed on the lesion. FFR: 0.69.   . Mid LAD-2 lesion, 25% stenosed.   . First Diagonal Branch   . Ost 1st Diag lesion, 75% stenosed. The lesion is type C located at the major branch.   . 1st Diag lesion, 100% stenosed.   . Angioplasty: Angioplasty alone was performed. There  is no pre-interventional antegrade distal flow. The post-interventional distal flow is normal (TIMI 3). The intervention was successful. No complications occured at this lesion. IC nitroglycerin was given. Pressure wire/FFR was not performed on the lesion  . Supplies used: [employer] [id number]  . There is a 25% residual stenosis post intervention.       Ramus Intermedius  . Vessel is small.   . Ramus lesion, 75% stenosed. The lesion is type C located at the major branch.     Left Circumflex   . Prox Cx lesion, 50% stenosed.     Right Coronary Artery   . Prox RCA to Dist RCA lesion, 99% stenosed.   . Dist RCA lesion, 100% stenosed. The lesion is type C Chronic total occlusion.   . Right Posterior Descending Artery   RPDA filled by collaterals from Dist LAD.   Marland Kitchen Third Right Posterolateral   3rd RPLB filled by collaterals from Dist Cx.      Wall Motion                 Left Heart  Left Ventricle The left ventricular size is normal. The left ventricular systolic function is normal. The left ventricular ejection fraction is 55-65% by visual estimate. There are no wall motion abnormalities in the left ventricle.   Aortic Valve There is no aortic valve stenosis.    Coronary Diagrams    Diagnostic Diagram           Post-Intervention Diagram            Implants     No implant documentation for this case.    PACS Images    Show images for Cardiac catheterization     Link to Procedure Log    Procedure Log      Hemo Data       Most Recent Value   AO Systolic Pressure  245 mmHg   AO Diastolic Pressure  69 mmHg   AO Mean  90 mmHg   LV Systolic Pressure  809 mmHg   LV Diastolic Pressure  8 mmHg   LV EDP  17 mmHg   Arterial Occlusion Pressure Extended Systolic Pressure  983 mmHg   Arterial Occlusion Pressure Extended Diastolic Pressure  82 mmHg   Arterial Occlusion Pressure Extended Mean Pressure  103 mmHg   Left Ventricular  Apex Extended Systolic Pressure  382 mmHg   Left Ventricular Apex Extended Diastolic Pressure  11 mmHg   Left Ventricular Apex Extended EDP Pressure  19 mmHg    Assessment/Plan:  He has severe multivessel coronary artery disease with unstable angina and NSTEMI. The culprit was an occluded large diagonal. The vessel was opened without stenting and he has not received Plavix. The LAD stenosis is significant and the diagonal has significant residual stenosis. The RCA is chronically occluded with collaterals to the distal vessel. I think CABG is a much better long term solution for this young gentleman than a complex LAD/Diag PCI. I can use both IMA's although he is short and the [place] will be too short to reach anything except as a free graft. I don't know if the left radial can be used. It depends on the dopplers. The left radial pulse is weak compared to the right. I discussed the operative procedure with the patient  including alternatives, benefits and risks; including but not limited to bleeding, blood transfusion, infection, stroke, myocardial infarction, graft failure, heart block requiring a permanent pacemaker, organ dysfunction, and death.  [name] understands and agrees to proceed.  We will schedule surgery for Tuesday and will get arterial dopplers on Monday.   [name] 05/30/2015, 7:19 PM

## 2015-05-30 NOTE — Progress Notes (Signed)
  Cardiothoracic Surgery  Patient seen. Consult note pending. Will plan CABG Tuesday am. Dopplers Monday to check left radial artery which has a weak pulse. He has a great right radial pulse but unfortunately this was used for his cath at 45 years old so we can't use it for bypass. I can use both IMA's although he has a short sternum so right IMA will have to be a free graft if long enough.

## 2015-05-31 ENCOUNTER — Inpatient Hospital Stay (HOSPITAL_COMMUNITY): Payer: BLUE CROSS/BLUE SHIELD

## 2015-05-31 DIAGNOSIS — I2510 Atherosclerotic heart disease of native coronary artery without angina pectoris: Secondary | ICD-10-CM

## 2015-05-31 LAB — CBC
HEMATOCRIT: 39.5 % (ref 39.0–52.0)
Hemoglobin: 13.7 g/dL (ref 13.0–17.0)
MCH: 29.9 pg (ref 26.0–34.0)
MCHC: 34.7 g/dL (ref 30.0–36.0)
MCV: 86.2 fL (ref 78.0–100.0)
Platelets: 241 10*3/uL (ref 150–400)
RBC: 4.58 MIL/uL (ref 4.22–5.81)
RDW: 13.5 % (ref 11.5–15.5)
WBC: 7.8 10*3/uL (ref 4.0–10.5)

## 2015-05-31 LAB — HEPARIN LEVEL (UNFRACTIONATED)
HEPARIN UNFRACTIONATED: 0.48 [IU]/mL (ref 0.30–0.70)
Heparin Unfractionated: 0.28 IU/mL — ABNORMAL LOW (ref 0.30–0.70)

## 2015-05-31 MED ORDER — ALPRAZOLAM 0.5 MG PO TABS
0.5000 mg | ORAL_TABLET | Freq: Two times a day (BID) | ORAL | Status: DC | PRN
  Administered 2015-05-31: 0.5 mg via ORAL
  Filled 2015-05-31: qty 1

## 2015-05-31 MED ORDER — HEPARIN BOLUS VIA INFUSION
1000.0000 [IU] | Freq: Once | INTRAVENOUS | Status: AC
  Administered 2015-05-31: 1000 [IU] via INTRAVENOUS

## 2015-05-31 NOTE — Progress Notes (Signed)
Pre-op Cardiac Surgery  Carotid Findings:  No significant extracranial carotid artery stenosis demonstrated. Vertebrals are patent with antegrade flow.  Upper Extremity Right Left  Brachial Pressures 131 Triphasic 128 Triphasic    Radial Waveforms         Triphasic         Triphasic  Ulnar Waveforms         Biphasic          Triphasic  Palmar Arch (Allen's Test)  normal Ulnar abnormal   Findings:   Palmer arch evaluation-Doppler waveforms remained normal with both radial artery and only right ulnar artery with compression at rest.  Left ulnar artery abnormal with compression at rest.   Lower  Extremity Right Left  Dorsalis Pedis 132 Triphasic 172 Triphasic  Posterior Tibial 161 Triphasic 163 Triphasic  Ankle/Brachial Indices 1.23 1.31    Findings:  ABI's and Doppler waveforms bilaterally are with limits at rest.

## 2015-05-31 NOTE — Progress Notes (Addendum)
ANTICOAGULATION CONSULT NOTE - Follow Up Consult  Pharmacy Consult for Heparin Indication: chest pain/ACS  Allergies  Allergen Reactions  . Amoxicillin Swelling    Hand swelling - as a teenager Has patient had a PCN reaction causing immediate rash, facial/tongue/throat swelling, SOB or lightheadedness with hypotension: Yes Has patient had a PCN reaction causing severe rash involving mucus membranes or skin necrosis: No Has patient had a PCN reaction that required hospitalization No Has patient had a PCN reaction occurring within the last 10 years: No If all of the above answers are "NO", then may proceed with Cephalosporin use.    Patient Measurements: Height: 5\' 9"  (175.3 cm) Weight: 179 lb 0.2 oz (81.2 kg) IBW/kg (Calculated) : 70.7  Vital Signs: Temp: 97.4 F (36.3 C) (03/05 0800) Temp Source: Oral (03/05 0800) BP: 106/64 mmHg (03/05 0900)  Labs:  Recent Labs  05/29/15 1115 05/29/15 1308 05/29/15 1835 05/29/15 2142 05/30/15 0412 05/30/15 0822 05/30/15 0827 05/30/15 2117 05/31/15 0240  HGB 15.5  --   --   --  14.0  --   --   --  13.7  HCT 45.2  --   --   --  40.2  --   --   --  39.5  PLT 280  --   --  253 276  --   --   --  241  LABPROT  --  13.7  --   --   --   --   --   --   --   INR  --  1.03  --   --   --   --   --   --   --   HEPARINUNFRC  --   --   --   --   --  0.15*  --  0.18* 0.28*  CREATININE 0.82  --   --   --  0.78  --   --   --   --   TROPONINI 0.49*  --  1.09* 0.95*  --   --  0.44*  --   --     Estimated Creatinine Clearance: 116.6 mL/min (by C-G formula based on Cr of 0.78).   Assessment: 84 YOM admitted with NSTEMI. S/P PCI w/ severe 3v disease. Pt started on tirofiban in the cath lab (x 18 hours after). Pharmacy consulted to dose IV heparin. Plans for CABG on Tuesday.   Initial HL subtherapeutic @ 0.28 on 1000 units/hr. Will increase rate (no bolus since tirofiban just turned off)  Hgb 14>13.7, PLT 241  Goal of Therapy:  Heparin level  0.3-0.7 units/ml Monitor platelets by anticoagulation protocol: Yes   Plan:  Heparin 1000 unit bolus x1 Increase heparin to 1650 units/hr  6hr HL  Mon s/sx bleeding Planning for CABG on Tuesday   Meagan C. Lennox Grumbles, PharmD Pharmacy Resident  Pager: [phone] 05/31/2015 9:30 AM   Addendum: Heparin level is now therapeutic at 0.48. Continue heparin at 1650 units/hr and follow up AM labs.  Nena Jordan, PharmD, BCPS 05/31/2015, 5:04 PM

## 2015-05-31 NOTE — Progress Notes (Signed)
Patient ID: Donald Oliver, male   DOB: [birth year], 45 y.o.   MRN: [id number]    Patient Name: Donald Oliver Date of Encounter: 05/31/2015     Active Problems:   Unstable angina pectoris [facility])   NSTEMI (non-ST elevated myocardial infarction) ([place])    SUBJECTIVE  No chest pain or sob. Anxious.  CURRENT MEDS . aspirin EC  81 mg Oral Daily  . dextromethorphan-guaiFENesin  1 tablet Oral BID  . metoprolol tartrate  25 mg Oral BID  . niacin  500 mg Oral QHS  . nicotine  21 mg Transdermal Q24H  . pantoprazole  40 mg Oral Daily  . rosuvastatin  10 mg Oral q1800  . sodium chloride flush  3 mL Intravenous Q12H    OBJECTIVE  Filed Vitals:   05/31/15 0700 05/31/15 0800 05/31/15 0900 05/31/15 1000  BP: 114/72 122/89 106/64 109/65  Pulse:      Temp:  97.4 F (36.3 C)    TempSrc:  Oral    Resp: 18 18 19 22   Height:      Weight:      SpO2:  96%      Intake/Output Summary (Last 24 hours) at 05/31/15 1018 Last data filed at 05/31/15 0900  Gross per 24 hour  Intake 1213.98 ml  Output    425 ml  Net 788.98 ml   Filed Weights   05/29/15 1600  Weight: 179 lb 0.2 oz (81.2 kg)    PHYSICAL EXAM  General: Pleasant, young man, NAD. Neuro: Alert and oriented X 3. Moves all extremities spontaneously. Psych: Normal affect. HEENT:  Normal  Neck: Supple without bruits or JVD. Lungs:  Resp regular and unlabored, CTA. Heart: RRR no s3, s4, or murmurs. Abdomen: Soft, non-tender, non-distended, BS + x 4.  Extremities: No clubbing, cyanosis or edema. DP/PT/Radials 2+ and equal bilaterally.  Accessory Clinical Findings  CBC  Recent Labs  05/29/15 1115  05/30/15 0412 05/31/15 0240  WBC 8.5  --  9.2 7.8  NEUTROABS 4.8  --   --   --   HGB 15.5  --  14.0 13.7  HCT 45.2  --  40.2 39.5  MCV 86.1  --  86.5 86.2  PLT 280  < > 276 241  < > = values in this interval not displayed. Basic Metabolic Panel  Recent Labs  05/29/15 1115 05/30/15 0412  NA 141 140  K 3.8 3.3*  CL 105  106  CO2 26 21*  GLUCOSE 105* 105*  BUN 16 10  CREATININE 0.82 0.78  CALCIUM 9.4 8.7*   Liver Function Tests  Recent Labs  05/29/15 1115  AST 27  ALT 31  ALKPHOS 61  BILITOT 1.2  PROT 7.4  ALBUMIN 4.4   No results for input(s): LIPASE, AMYLASE in the last 72 hours. Cardiac Enzymes  Recent Labs  05/29/15 1835 05/29/15 2142 05/30/15 0827  TROPONINI 1.09* 0.95* 0.44*   BNP Invalid input(s): POCBNP D-Dimer No results for input(s): DDIMER in the last 72 hours. Hemoglobin A1C No results for input(s): HGBA1C in the last 72 hours. Fasting Lipid Panel No results for input(s): CHOL, HDL, LDLCALC, TRIG, CHOLHDL, LDLDIRECT in the last 72 hours. Thyroid Function Tests  Recent Labs  05/29/15 1115  TSH 1.315    TELE  nsr  Radiology/Studies  X-ray Chest Pa And Lateral  05/29/2015  CLINICAL DATA:  Unstable angina. EXAM: CHEST  2 VIEW COMPARISON:  None. FINDINGS: The heart size and mediastinal contours are within normal limits.  Both lungs are clear. No pneumothorax or pleural effusion is noted. The visualized skeletal structures are unremarkable. IMPRESSION: No active cardiopulmonary disease. Electronically Signed   By: Marijo Conception, M.D.   On: 05/29/2015 13:50    ASSESSMENT AND PLAN  1. Sefvere 3 vessel CAD - for CABG this week. He denies anginal symptoms. 2. Anxiety - he is appropriately nervous. Will prescribe medication 3. Tobacco abuse - discussed smoking cessation. He plans to stop smoking  Jonnie Kubly,M.D.  05/31/2015 10:18 AM

## 2015-05-31 NOTE — Progress Notes (Signed)
Utilization Review Completed.Donald Oliver T3/07/2015  

## 2015-06-01 ENCOUNTER — Inpatient Hospital Stay (HOSPITAL_COMMUNITY): Payer: BLUE CROSS/BLUE SHIELD

## 2015-06-01 ENCOUNTER — Encounter (HOSPITAL_COMMUNITY): Payer: Self-pay | Admitting: Interventional Cardiology

## 2015-06-01 DIAGNOSIS — I2510 Atherosclerotic heart disease of native coronary artery without angina pectoris: Secondary | ICD-10-CM

## 2015-06-01 DIAGNOSIS — I25110 Atherosclerotic heart disease of native coronary artery with unstable angina pectoris: Secondary | ICD-10-CM

## 2015-06-01 HISTORY — DX: Atherosclerotic heart disease of native coronary artery without angina pectoris: I25.10

## 2015-06-01 LAB — PULMONARY FUNCTION TEST
FEF 25-75 PRE: 4.02 L/s
FEF2575-%Pred-Pre: 111 %
FEV1-%Pred-Pre: 86 %
FEV1-Pre: 3.41 L
FEV1FVC-%PRED-PRE: 105 %
FEV6-%PRED-PRE: 84 %
FEV6-Pre: 4.1 L
FEV6FVC-%PRED-PRE: 103 %
FVC-%Pred-Pre: 81 %
FVC-PRE: 4.1 L
PRE FEV1/FVC RATIO: 83 %
PRE FEV6/FVC RATIO: 100 %

## 2015-06-01 LAB — CBC
HCT: 40.9 % (ref 39.0–52.0)
Hemoglobin: 14.3 g/dL (ref 13.0–17.0)
MCH: 29.9 pg (ref 26.0–34.0)
MCHC: 35 g/dL (ref 30.0–36.0)
MCV: 85.4 fL (ref 78.0–100.0)
PLATELETS: 256 10*3/uL (ref 150–400)
RBC: 4.79 MIL/uL (ref 4.22–5.81)
RDW: 13.3 % (ref 11.5–15.5)
WBC: 9.2 10*3/uL (ref 4.0–10.5)

## 2015-06-01 LAB — TYPE AND SCREEN
ABO/RH(D): O POS
Antibody Screen: NEGATIVE

## 2015-06-01 LAB — ABO/RH: ABO/RH(D): O POS

## 2015-06-01 LAB — HEPARIN LEVEL (UNFRACTIONATED): HEPARIN UNFRACTIONATED: 0.46 [IU]/mL (ref 0.30–0.70)

## 2015-06-01 MED ORDER — SODIUM CHLORIDE 0.9 % IV SOLN
INTRAVENOUS | Status: DC
  Filled 2015-06-01: qty 30

## 2015-06-01 MED ORDER — TEMAZEPAM 15 MG PO CAPS
15.0000 mg | ORAL_CAPSULE | Freq: Once | ORAL | Status: AC | PRN
  Administered 2015-06-01: 15 mg via ORAL
  Filled 2015-06-01: qty 1

## 2015-06-01 MED ORDER — SODIUM CHLORIDE 0.9 % IV SOLN
INTRAVENOUS | Status: DC
  Administered 2015-06-02: 1.8 [IU]/h via INTRAVENOUS
  Filled 2015-06-01: qty 2.5

## 2015-06-01 MED ORDER — PLASMA-LYTE 148 IV SOLN
INTRAVENOUS | Status: DC
  Filled 2015-06-01: qty 2.5

## 2015-06-01 MED ORDER — MAGNESIUM SULFATE 50 % IJ SOLN
40.0000 meq | INTRAMUSCULAR | Status: DC
  Filled 2015-06-01: qty 10

## 2015-06-01 MED ORDER — POTASSIUM CHLORIDE 2 MEQ/ML IV SOLN
80.0000 meq | INTRAVENOUS | Status: DC
  Filled 2015-06-01: qty 40

## 2015-06-01 MED ORDER — AMINOCAPROIC ACID 250 MG/ML IV SOLN
INTRAVENOUS | Status: DC
  Administered 2015-06-02: 69.8 mL/h via INTRAVENOUS
  Filled 2015-06-01: qty 40

## 2015-06-01 MED ORDER — NITROGLYCERIN IN D5W 200-5 MCG/ML-% IV SOLN
2.0000 ug/min | INTRAVENOUS | Status: DC
  Administered 2015-06-02: 20 ug/min via INTRAVENOUS
  Filled 2015-06-01: qty 250

## 2015-06-01 MED ORDER — DEXMEDETOMIDINE HCL IN NACL 400 MCG/100ML IV SOLN
0.1000 ug/kg/h | INTRAVENOUS | Status: DC
  Administered 2015-06-02: .3 ug/kg/h via INTRAVENOUS
  Filled 2015-06-01: qty 100

## 2015-06-01 MED ORDER — BISACODYL 5 MG PO TBEC
5.0000 mg | DELAYED_RELEASE_TABLET | Freq: Once | ORAL | Status: AC
  Administered 2015-06-01: 5 mg via ORAL
  Filled 2015-06-01: qty 1

## 2015-06-01 MED ORDER — METOPROLOL TARTRATE 12.5 MG HALF TABLET
12.5000 mg | ORAL_TABLET | Freq: Once | ORAL | Status: AC
  Administered 2015-06-02: 12.5 mg via ORAL
  Filled 2015-06-01: qty 1

## 2015-06-01 MED ORDER — CHLORHEXIDINE GLUCONATE CLOTH 2 % EX PADS
6.0000 | MEDICATED_PAD | Freq: Once | CUTANEOUS | Status: AC
  Administered 2015-06-01: 6 via TOPICAL

## 2015-06-01 MED ORDER — PHENYLEPHRINE HCL 10 MG/ML IJ SOLN
30.0000 ug/min | INTRAVENOUS | Status: DC
  Filled 2015-06-01: qty 2

## 2015-06-01 MED ORDER — ZOLPIDEM TARTRATE 5 MG PO TABS: 5.0000 mg | ORAL_TABLET | Freq: Every evening | ORAL | Status: DC | PRN

## 2015-06-01 MED ORDER — LEVOFLOXACIN IN D5W 500 MG/100ML IV SOLN
500.0000 mg | INTRAVENOUS | Status: DC
  Administered 2015-06-02: 500 mg via INTRAVENOUS
  Filled 2015-06-01: qty 100

## 2015-06-01 MED ORDER — DIAZEPAM 5 MG PO TABS
10.0000 mg | ORAL_TABLET | Freq: Once | ORAL | Status: AC
  Administered 2015-06-02: 10 mg via ORAL
  Filled 2015-06-01: qty 2

## 2015-06-01 MED ORDER — ALPRAZOLAM 0.25 MG PO TABS
0.2500 mg | ORAL_TABLET | ORAL | Status: DC | PRN
  Filled 2015-06-01: qty 2

## 2015-06-01 MED ORDER — ALPRAZOLAM 0.5 MG PO TABS
0.5000 mg | ORAL_TABLET | Freq: Three times a day (TID) | ORAL | Status: DC | PRN
  Administered 2015-06-01 (×2): 0.5 mg via ORAL
  Filled 2015-06-01: qty 1

## 2015-06-01 MED ORDER — EPINEPHRINE HCL 1 MG/ML IJ SOLN
0.0000 ug/min | INTRAVENOUS | Status: DC
  Filled 2015-06-01: qty 4

## 2015-06-01 MED ORDER — VANCOMYCIN HCL 10 G IV SOLR
1250.0000 mg | INTRAVENOUS | Status: DC
  Administered 2015-06-02: 1250 mg via INTRAVENOUS
  Filled 2015-06-01: qty 1250

## 2015-06-01 MED ORDER — DOPAMINE-DEXTROSE 3.2-5 MG/ML-% IV SOLN
0.0000 ug/kg/min | INTRAVENOUS | Status: DC
  Filled 2015-06-01: qty 250

## 2015-06-01 MED ORDER — CHLORHEXIDINE GLUCONATE 0.12 % MT SOLN
15.0000 mL | Freq: Once | OROMUCOSAL | Status: AC
  Administered 2015-06-02: 15 mL via OROMUCOSAL
  Filled 2015-06-01: qty 15

## 2015-06-01 NOTE — Progress Notes (Signed)
Subjective:  Vague chest pain but no severe pain. Mild anxious about surgery.  Objective:  Vital Signs in the last 24 hours: BP 132/82 mmHg  Pulse 64  Temp(Src) 97.5 F (36.4 C) (Oral)  Resp 16  Ht 5\' 9"  (1.753 m)  Wt 81.2 kg (179 lb 0.2 oz)  BMI 26.42 kg/m2  SpO2 95%  Physical Exam: Mild anxious WM in NAD Lungs:  Clear Cardiac:  Regular rhythm, normal S1 and S2, no S3 Extremities:  No edema present  Intake/Output from previous day: 03/05 0701 - 03/06 0700 In: 1161.7 [P.O.:770; I.V.:391.7] Out: 1475 [Urine:1475]  Weight Filed Weights   05/29/15 1600  Weight: 81.2 kg (179 lb 0.2 oz)    Lab Results: Basic Metabolic Panel:  Recent Labs  05/29/15 1115 05/30/15 0412  NA 141 140  K 3.8 3.3*  CL 105 106  CO2 26 21*  GLUCOSE 105* 105*  BUN 16 10  CREATININE 0.82 0.78   CBC:  Recent Labs  05/29/15 1115  05/31/15 0240 06/01/15 0231  WBC 8.5  < > 7.8 9.2  NEUTROABS 4.8  --   --   --   HGB 15.5  < > 13.7 14.3  HCT 45.2  < > 39.5 40.9  MCV 86.1  < > 86.2 85.4  PLT 280  < > 241 256  < > = values in this interval not displayed.  Cardiac Panel (last 3 results)  Recent Labs  05/29/15 1835 05/29/15 2142 05/30/15 0827  TROPONINI 1.09* 0.95* 0.44*    Telemetry: sinus  Assessment/Plan:  1.  Severe three-vessel coronary artery disease with recent non-STEMI for CABG 2.  Anxiety 3.  History of tobacco abuse 4.  Hyperlipidemia  Recommendations:  Discussed surgery.  We'll be sure he has some anxiolytics and something for sleep tonight.  Appreciate Dr. Vivi Martens help.      Kerry Hough  MD [facility]  06/01/2015, 8:53 AM

## 2015-06-01 NOTE — Progress Notes (Signed)
3 Days Post-Op Procedure(s) (LRB): Left Heart Cath and Coronary Angiography (N/A) Coronary Balloon Angioplasty Subjective:  No complaints  Objective: Vital signs in last 24 hours: Temp:  [97.5 F (36.4 C)-98.4 F (36.9 C)] 98.4 F (36.9 C) (03/06 1317) Cardiac Rhythm:  [-] Normal sinus rhythm (03/06 1317) Resp:  [12-23] 12 (03/06 0911) BP: (127-141)/(82-95) 141/95 mmHg (03/06 0911) SpO2:  [95 %-97 %] 96 % (03/06 1317)  Hemodynamic parameters for last 24 hours:    Intake/Output from previous day: 03/05 0701 - 03/06 0700 In: 1161.7 [P.O.:770; I.V.:391.7] Out: 1475 [Urine:1475] Intake/Output this shift: Total I/O In: 165 [I.V.:165] Out: 1900 [Urine:1900]  General appearance: alert and cooperative Heart: regular rate and rhythm, S1, S2 normal, no murmur, click, rub or gallop Lungs: clear to auscultation bilaterally  Lab Results:  Recent Labs  05/31/15 0240 06/01/15 0231  WBC 7.8 9.2  HGB 13.7 14.3  HCT 39.5 40.9  PLT 241 256   BMET:  Recent Labs  05/30/15 0412  NA 140  K 3.3*  CL 106  CO2 21*  GLUCOSE 105*  BUN 10  CREATININE 0.78  CALCIUM 8.7*    PT/INR: No results for input(s): LABPROT, INR in the last 72 hours. ABG    Component Value Date/Time   TCO2 30 07/02/2012 1422   CBG (last 3)  No results for input(s): GLUCAP in the last 72 hours.  Pre-op Cardiac Surgery  Carotid Findings: No significant extracranial carotid artery stenosis demonstrated. Vertebrals are patent with antegrade flow.  Upper Extremity Right Left  Brachial Pressures 131 Triphasic 128 Triphasic   Radial Waveforms  Triphasic  Triphasic  Ulnar Waveforms  Biphasic  Triphasic  Palmar Arch (Allen's Test) normal Ulnar abnormal   Findings:  Palmer arch evaluation-Doppler waveforms remained normal with both radial artery and only right ulnar artery with compression at rest.  Left ulnar artery abnormal with compression at  rest.   Lower Extremity Right Left  Dorsalis Pedis 132 Triphasic 172 Triphasic  Posterior Tibial 161 Triphasic 163 Triphasic  Ankle/Brachial Indices 1.23 1.31    Findings: ABI's and Doppler waveforms bilaterally are with limits at rest.         Assessment/Plan:  Severe multivessel coronary artery disease. Plan CABG tomorrow. 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 I would plan to use both IMA's and probably a vein to the distal RCA. I discussed the operative procedure with the patient and his wife including alternatives, benefits and risks; including but not limited to bleeding, blood transfusion, infection, stroke, myocardial infarction, graft failure, heart block requiring a permanent pacemaker, organ dysfunction, and death.  Donald Oliver understands and agrees to proceed.  We will schedule surgery for tomorrow morning.     LOS: 3 days    Donald Oliver 06/01/2015

## 2015-06-01 NOTE — Progress Notes (Signed)
CARDIAC REHAB PHASE I   PRE:  Rate/Rhythm: 58 SB  BP:  Supine:   Sitting: 141/95  Standing:    SaO2: 97%RA  MODE:  Ambulation: 700 ft   POST:  Rate/Rhythm: 67 SR  BP:  Supine:   Sitting: 132/84  Standing:    SaO2: 97%RA 0905-0930 Pt walked 700 ft with steady gait. Stated felt good to be up. Tolerated well. Will see after surgery. No c/o CP.   Graylon Good, RN BSN  06/01/2015 9:26 AM

## 2015-06-01 NOTE — Progress Notes (Signed)
ANTICOAGULATION CONSULT NOTE - Follow Up Consult  Pharmacy Consult for Heparin Indication: chest pain/ACS  Allergies  Allergen Reactions  . Amoxicillin Swelling    Hand swelling - as a teenager Has patient had a PCN reaction causing immediate rash, facial/tongue/throat swelling, SOB or lightheadedness with hypotension: Yes Has patient had a PCN reaction causing severe rash involving mucus membranes or skin necrosis: No Has patient had a PCN reaction that required hospitalization No Has patient had a PCN reaction occurring within the last 10 years: No If all of the above answers are "NO", then may proceed with Cephalosporin use.    Patient Measurements: Height: 5\' 9"  (175.3 cm) Weight: 179 lb 0.2 oz (81.2 kg) IBW/kg (Calculated) : 70.7  Vital Signs: Temp: 97.5 F (36.4 C) (03/06 0347) Temp Source: Oral (03/06 0347) BP: 132/82 mmHg (03/05 2330)  Labs:  Recent Labs  05/29/15 1115 05/29/15 1308 05/29/15 1835 05/29/15 2142 05/30/15 0412  05/30/15 0827  05/31/15 0240 05/31/15 1545 06/01/15 0231  HGB 15.5  --   --   --  14.0  --   --   --  13.7  --  14.3  HCT 45.2  --   --   --  40.2  --   --   --  39.5  --  40.9  PLT 280  --   --  253 276  --   --   --  241  --  256  LABPROT  --  13.7  --   --   --   --   --   --   --   --   --   INR  --  1.03  --   --   --   --   --   --   --   --   --   HEPARINUNFRC  --   --   --   --   --   < >  --   < > 0.28* 0.48 0.46  CREATININE 0.82  --   --   --  0.78  --   --   --   --   --   --   TROPONINI 0.49*  --  1.09* 0.95*  --   --  0.44*  --   --   --   --   < > = values in this interval not displayed.  Estimated Creatinine Clearance: 116.6 mL/min (by C-G formula based on Cr of 0.78).   Assessment: 58 YOM admitted with NSTEMI. S/P PCI w/ severe 3v disease.  Pharmacy consulted to dose IV heparin. Plans for CABG on Tuesday.  -Heparin level= 0.46 and at goal, Hg= 14.3, plt= 256   Goal of Therapy:  Heparin level 0.3-0.7  units/ml Monitor platelets by anticoagulation protocol: Yes   Plan:  -No heparin changes needed -Daily heparin level and CBC -CABG on 3/7  Hildred Laser, Pharm D 06/01/2015 9:33 AM

## 2015-06-02 ENCOUNTER — Encounter (HOSPITAL_COMMUNITY): Payer: Self-pay | Admitting: Certified Registered Nurse Anesthetist

## 2015-06-02 ENCOUNTER — Inpatient Hospital Stay (HOSPITAL_COMMUNITY): Payer: BLUE CROSS/BLUE SHIELD

## 2015-06-02 ENCOUNTER — Encounter (HOSPITAL_COMMUNITY)
Admission: AD | Disposition: A | Discharge status: Home / Self Care | Payer: Self-pay | Source: Ambulatory Visit | Attending: Surgery

## 2015-06-02 ENCOUNTER — Inpatient Hospital Stay (HOSPITAL_COMMUNITY): Payer: BLUE CROSS/BLUE SHIELD | Admitting: Anesthesiology

## 2015-06-02 DIAGNOSIS — Z951 Presence of aortocoronary bypass graft: Secondary | ICD-10-CM

## 2015-06-02 DIAGNOSIS — I25110 Atherosclerotic heart disease of native coronary artery with unstable angina pectoris: Secondary | ICD-10-CM

## 2015-06-02 HISTORY — PX: CORONARY ARTERY BYPASS GRAFT: SHX141

## 2015-06-02 HISTORY — PX: TEE WITHOUT CARDIOVERSION: SHX5443

## 2015-06-02 HISTORY — DX: Presence of aortocoronary bypass graft: Z95.1

## 2015-06-02 LAB — POCT I-STAT 3, ART BLOOD GAS (G3+)
Acid-base deficit: 3 mmol/L — ABNORMAL HIGH (ref 0.0–2.0)
Acid-base deficit: 3 mmol/L — ABNORMAL HIGH (ref 0.0–2.0)
Acid-base deficit: 6 mmol/L — ABNORMAL HIGH (ref 0.0–2.0)
Acid-base deficit: 2 mmol/L (ref 0.0–2.0)
BICARBONATE: 23.2 meq/L (ref 20.0–24.0)
Bicarbonate: 23.1 mEq/L (ref 20.0–24.0)
Bicarbonate: 19.6 mEq/L — ABNORMAL LOW (ref 20.0–24.0)
Bicarbonate: 23.8 mEq/L (ref 20.0–24.0)
O2 Saturation: 100 %
O2 Saturation: 86 %
O2 Saturation: 86 %
O2 Saturation: 89 %
PCO2 ART: 43.4 mmHg (ref 35.0–45.0)
PCO2 ART: 43.5 mmHg (ref 35.0–45.0)
PCO2 ART: 41.1 mmHg (ref 35.0–45.0)
PCO2 ART: 46.1 mmHg — AB (ref 35.0–45.0)
PH ART: 7.336 — AB (ref 7.350–7.450)
PH ART: 7.333 — AB (ref 7.350–7.450)
PH ART: 7.325 — AB (ref 7.350–7.450)
PO2 ART: 60 mmHg — AB (ref 80.0–100.0)
PO2 ART: 64 mmHg — AB (ref 80.0–100.0)
Patient temperature: 37.9
Patient temperature: 38.1
TCO2: 25 mmol/L (ref 0–100)
TCO2: 24 mmol/L (ref 0–100)
TCO2: 21 mmol/L (ref 0–100)
TCO2: 25 mmol/L (ref 0–100)
pH, Arterial: 7.29 — ABNORMAL LOW (ref 7.350–7.450)
pO2, Arterial: 316 mmHg — ABNORMAL HIGH (ref 80.0–100.0)
pO2, Arterial: 55 mmHg — ABNORMAL LOW (ref 80.0–100.0)

## 2015-06-02 LAB — BASIC METABOLIC PANEL
ANION GAP: 10 (ref 5–15)
BUN: 12 mg/dL (ref 6–20)
CHLORIDE: 106 mmol/L (ref 101–111)
CO2: 22 mmol/L (ref 22–32)
Calcium: 8.9 mg/dL (ref 8.9–10.3)
Creatinine, Ser: 0.62 mg/dL (ref 0.61–1.24)
GFR calc Af Amer: >60 mL/min (ref >60)
GLUCOSE: 98 mg/dL (ref 65–99)
POTASSIUM: 3.6 mmol/L (ref 3.5–5.1)
Sodium: 138 mmol/L (ref 135–145)

## 2015-06-02 LAB — URINALYSIS, ROUTINE W REFLEX MICROSCOPIC
Bilirubin Urine: NEGATIVE
GLUCOSE, UA: NEGATIVE mg/dL
Hgb urine dipstick: NEGATIVE
KETONES UR: NEGATIVE mg/dL
LEUKOCYTES UA: NEGATIVE
NITRITE: NEGATIVE
PROTEIN: NEGATIVE mg/dL
Specific Gravity, Urine: 1.009 (ref 1.005–1.030)
pH: 6.5 (ref 5.0–8.0)

## 2015-06-02 LAB — POCT I-STAT, CHEM 8
BUN: 11 mg/dL (ref 6–20)
BUN: 9 mg/dL (ref 6–20)
BUN: 8 mg/dL (ref 6–20)
BUN: 8 mg/dL (ref 6–20)
BUN: 9 mg/dL (ref 6–20)
BUN: 7 mg/dL (ref 6–20)
CALCIUM ION: 1.17 mmol/L (ref 1.12–1.23)
CALCIUM ION: 1.18 mmol/L (ref 1.12–1.23)
CALCIUM ION: 1.04 mmol/L — AB (ref 1.12–1.23)
CALCIUM ION: 1.11 mmol/L — AB (ref 1.12–1.23)
CHLORIDE: 100 mmol/L — AB (ref 101–111)
CHLORIDE: 103 mmol/L (ref 101–111)
CHLORIDE: 108 mmol/L (ref 101–111)
CREATININE: 0.5 mg/dL — AB (ref 0.61–1.24)
CREATININE: 0.4 mg/dL — AB (ref 0.61–1.24)
CREATININE: 0.6 mg/dL — AB (ref 0.61–1.24)
Calcium, Ion: 1.02 mmol/L — ABNORMAL LOW (ref 1.12–1.23)
Calcium, Ion: 1.16 mmol/L (ref 1.12–1.23)
Chloride: 105 mmol/L (ref 101–111)
Chloride: 107 mmol/L (ref 101–111)
Chloride: 103 mmol/L (ref 101–111)
Creatinine, Ser: 0.5 mg/dL — ABNORMAL LOW (ref 0.61–1.24)
Creatinine, Ser: 0.4 mg/dL — ABNORMAL LOW (ref 0.61–1.24)
Creatinine, Ser: 0.7 mg/dL (ref 0.61–1.24)
GLUCOSE: 151 mg/dL — AB (ref 65–99)
GLUCOSE: 106 mg/dL — AB (ref 65–99)
GLUCOSE: 151 mg/dL — AB (ref 65–99)
Glucose, Bld: 110 mg/dL — ABNORMAL HIGH (ref 65–99)
Glucose, Bld: 137 mg/dL — ABNORMAL HIGH (ref 65–99)
Glucose, Bld: 125 mg/dL — ABNORMAL HIGH (ref 65–99)
HCT: 38 % — ABNORMAL LOW (ref 39.0–52.0)
HCT: 26 % — ABNORMAL LOW (ref 39.0–52.0)
HCT: 29 % — ABNORMAL LOW (ref 39.0–52.0)
HCT: 30 % — ABNORMAL LOW (ref 39.0–52.0)
HCT: 35 % — ABNORMAL LOW (ref 39.0–52.0)
HEMATOCRIT: 35 % — AB (ref 39.0–52.0)
HEMOGLOBIN: 11.9 g/dL — AB (ref 13.0–17.0)
Hemoglobin: 12.9 g/dL — ABNORMAL LOW (ref 13.0–17.0)
Hemoglobin: 8.8 g/dL — ABNORMAL LOW (ref 13.0–17.0)
Hemoglobin: 9.9 g/dL — ABNORMAL LOW (ref 13.0–17.0)
Hemoglobin: 10.2 g/dL — ABNORMAL LOW (ref 13.0–17.0)
Hemoglobin: 11.9 g/dL — ABNORMAL LOW (ref 13.0–17.0)
POTASSIUM: 3.8 mmol/L (ref 3.5–5.1)
POTASSIUM: 4.1 mmol/L (ref 3.5–5.1)
Potassium: 3.4 mmol/L — ABNORMAL LOW (ref 3.5–5.1)
Potassium: 3.6 mmol/L (ref 3.5–5.1)
Potassium: 4.3 mmol/L (ref 3.5–5.1)
Potassium: 4.1 mmol/L (ref 3.5–5.1)
SODIUM: 138 mmol/L (ref 135–145)
SODIUM: 142 mmol/L (ref 135–145)
Sodium: 140 mmol/L (ref 135–145)
Sodium: 135 mmol/L (ref 135–145)
Sodium: 140 mmol/L (ref 135–145)
Sodium: 139 mmol/L (ref 135–145)
TCO2: 24 mmol/L (ref 0–100)
TCO2: 22 mmol/L (ref 0–100)
TCO2: 24 mmol/L (ref 0–100)
TCO2: 25 mmol/L (ref 0–100)
TCO2: 21 mmol/L (ref 0–100)
TCO2: 23 mmol/L (ref 0–100)

## 2015-06-02 LAB — PLATELET COUNT: PLATELETS: 172 10*3/uL (ref 150–400)

## 2015-06-02 LAB — CBC
HCT: 41.2 % (ref 39.0–52.0)
HCT: 33.6 % — ABNORMAL LOW (ref 39.0–52.0)
HEMATOCRIT: 36.9 % — AB (ref 39.0–52.0)
HEMOGLOBIN: 12.1 g/dL — AB (ref 13.0–17.0)
Hemoglobin: 15 g/dL (ref 13.0–17.0)
Hemoglobin: 12.8 g/dL — ABNORMAL LOW (ref 13.0–17.0)
MCH: 31.3 pg (ref 26.0–34.0)
MCH: 29.7 pg (ref 26.0–34.0)
MCH: 30.9 pg (ref 26.0–34.0)
MCHC: 36.4 g/dL — ABNORMAL HIGH (ref 30.0–36.0)
MCHC: 34.7 g/dL (ref 30.0–36.0)
MCHC: 36 g/dL (ref 30.0–36.0)
MCV: 85.8 fL (ref 78.0–100.0)
MCV: 85.6 fL (ref 78.0–100.0)
MCV: 85.7 fL (ref 78.0–100.0)
PLATELETS: 258 10*3/uL (ref 150–400)
PLATELETS: 123 10*3/uL — AB (ref 150–400)
PLATELETS: 165 10*3/uL (ref 150–400)
RBC: 4.8 MIL/uL (ref 4.22–5.81)
RBC: 4.31 MIL/uL (ref 4.22–5.81)
RBC: 3.92 MIL/uL — AB (ref 4.22–5.81)
RDW: 13.5 % (ref 11.5–15.5)
RDW: 13.3 % (ref 11.5–15.5)
RDW: 13.6 % (ref 11.5–15.5)
WBC: 8.8 10*3/uL (ref 4.0–10.5)
WBC: 11.2 10*3/uL — ABNORMAL HIGH (ref 4.0–10.5)
WBC: 17.1 10*3/uL — AB (ref 4.0–10.5)

## 2015-06-02 LAB — POCT I-STAT 4, (NA,K, GLUC, HGB,HCT)
GLUCOSE: 111 mg/dL — AB (ref 65–99)
HEMATOCRIT: 34 % — AB (ref 39.0–52.0)
Hemoglobin: 11.6 g/dL — ABNORMAL LOW (ref 13.0–17.0)
POTASSIUM: 3.5 mmol/L (ref 3.5–5.1)
SODIUM: 140 mmol/L (ref 135–145)

## 2015-06-02 LAB — PROTIME-INR
INR: 1.23 (ref 0.00–1.49)
PROTHROMBIN TIME: 15.7 s — AB (ref 11.6–15.2)

## 2015-06-02 LAB — CREATININE, SERUM
CREATININE: 0.82 mg/dL (ref 0.61–1.24)
GFR calc Af Amer: >60 mL/min (ref >60)
GFR calc non Af Amer: >60 mL/min (ref >60)

## 2015-06-02 LAB — MAGNESIUM: Magnesium: 2.8 mg/dL — ABNORMAL HIGH (ref 1.7–2.4)

## 2015-06-02 LAB — HEMOGLOBIN AND HEMATOCRIT, BLOOD
HEMATOCRIT: 27.7 % — AB (ref 39.0–52.0)
HEMOGLOBIN: 9.6 g/dL — AB (ref 13.0–17.0)

## 2015-06-02 LAB — APTT: APTT: 26 s (ref 24–37)

## 2015-06-02 LAB — HEPARIN LEVEL (UNFRACTIONATED)

## 2015-06-02 SURGERY — CORONARY ARTERY BYPASS GRAFTING (CABG)
Anesthesia: General | Site: Chest

## 2015-06-02 MED ORDER — METOPROLOL TARTRATE 25 MG/10 ML ORAL SUSPENSION: 12.5000 mg | Freq: Two times a day (BID) | ORAL | Status: DC

## 2015-06-02 MED ORDER — ARTIFICIAL TEARS OP OINT
TOPICAL_OINTMENT | OPHTHALMIC | Status: DC | PRN
  Administered 2015-06-02: 1 via OPHTHALMIC

## 2015-06-02 MED ORDER — GLYCOPYRROLATE 0.2 MG/ML IJ SOLN
INTRAMUSCULAR | Status: DC | PRN
  Administered 2015-06-02: 0.2 mg via INTRAVENOUS

## 2015-06-02 MED ORDER — THROMBIN 20000 UNITS EX SOLR
OROMUCOSAL | Status: DC | PRN
  Administered 2015-06-02 (×2): 4 mL via TOPICAL
  Administered 2015-06-02: 8 mL via TOPICAL

## 2015-06-02 MED ORDER — PHENYLEPHRINE HCL 10 MG/ML IJ SOLN
0.0000 ug/min | INTRAVENOUS | Status: DC
  Filled 2015-06-02: qty 2

## 2015-06-02 MED ORDER — HEPARIN SODIUM (PORCINE) 1000 UNIT/ML IJ SOLN
INTRAMUSCULAR | Status: DC | PRN
  Administered 2015-06-02: 40000 [IU] via INTRAVENOUS

## 2015-06-02 MED ORDER — ASPIRIN 81 MG PO CHEW: 324.0000 mg | CHEWABLE_TABLET | Freq: Every day | ORAL | Status: DC

## 2015-06-02 MED ORDER — DOCUSATE SODIUM 100 MG PO CAPS
200.0000 mg | ORAL_CAPSULE | Freq: Every day | ORAL | Status: DC
  Administered 2015-06-03 – 2015-06-04 (×2): 200 mg via ORAL
  Filled 2015-06-02 (×2): qty 2

## 2015-06-02 MED ORDER — TRAMADOL HCL 50 MG PO TABS
50.0000 mg | ORAL_TABLET | ORAL | Status: DC | PRN
Start: 2015-06-02 — End: 2015-06-05
  Administered 2015-06-04 – 2015-06-05 (×2): 100 mg via ORAL
  Filled 2015-06-02 (×2): qty 2

## 2015-06-02 MED ORDER — HEMOSTATIC AGENTS (NO CHARGE) OPTIME
TOPICAL | Status: DC | PRN
  Administered 2015-06-02: 1 via TOPICAL

## 2015-06-02 MED ORDER — VECURONIUM BROMIDE 10 MG IV SOLR
INTRAVENOUS | Status: DC | PRN
  Administered 2015-06-02: 5 mg via INTRAVENOUS
  Administered 2015-06-02: 3 mg via INTRAVENOUS
  Administered 2015-06-02: 2 mg via INTRAVENOUS
  Administered 2015-06-02: 5 mg via INTRAVENOUS
  Administered 2015-06-02: 7 mg via INTRAVENOUS
  Administered 2015-06-02: 8 mg via INTRAVENOUS

## 2015-06-02 MED ORDER — NITROGLYCERIN IN D5W 200-5 MCG/ML-% IV SOLN: 0.0000 ug/min | INTRAVENOUS | Status: DC

## 2015-06-02 MED ORDER — LACTATED RINGERS IV SOLN
INTRAVENOUS | Status: DC | PRN
  Administered 2015-06-02: 07:00:00 via INTRAVENOUS

## 2015-06-02 MED ORDER — CHLORHEXIDINE GLUCONATE 0.12% ORAL RINSE (MEDLINE KIT): 15.0000 mL | Freq: Two times a day (BID) | OROMUCOSAL | Status: DC

## 2015-06-02 MED ORDER — DEXMEDETOMIDINE HCL IN NACL 200 MCG/50ML IV SOLN
0.0000 ug/kg/h | INTRAVENOUS | Status: DC
  Administered 2015-06-02: 0.5 ug/kg/h via INTRAVENOUS
  Administered 2015-06-02: 0.2 ug/kg/h via INTRAVENOUS
  Filled 2015-06-02 (×2): qty 50

## 2015-06-02 MED ORDER — MIDAZOLAM HCL 5 MG/5ML IJ SOLN: INTRAMUSCULAR | Status: DC | PRN

## 2015-06-02 MED ORDER — LACTATED RINGERS IV SOLN
INTRAVENOUS | Status: DC
  Administered 2015-06-02 (×2): 20 mL/h via INTRAVENOUS

## 2015-06-02 MED ORDER — PROPOFOL 10 MG/ML IV BOLUS
INTRAVENOUS | Status: DC | PRN
  Administered 2015-06-02: 120 mg via INTRAVENOUS
  Administered 2015-06-02 (×2): 100 mg via INTRAVENOUS
  Administered 2015-06-02: 50 mg via INTRAVENOUS
  Administered 2015-06-02: 20 mg via INTRAVENOUS

## 2015-06-02 MED ORDER — FENTANYL CITRATE (PF) 100 MCG/2ML IJ SOLN
INTRAMUSCULAR | Status: AC
  Administered 2015-06-02: 100 ug via INTRAVENOUS
  Administered 2015-06-02 (×2): 200 ug via INTRAVENOUS
  Administered 2015-06-02: 150 ug via INTRAVENOUS
  Administered 2015-06-02: 50 ug via INTRAVENOUS
  Administered 2015-06-02: 100 ug via INTRAVENOUS
  Administered 2015-06-02: 200 ug via INTRAVENOUS
  Administered 2015-06-02 (×2): 100 ug via INTRAVENOUS
  Administered 2015-06-02: 50 ug via INTRAVENOUS
  Administered 2015-06-02: 150 ug via INTRAVENOUS
  Administered 2015-06-02: 100 ug via INTRAVENOUS
  Administered 2015-06-02: 150 ug via INTRAVENOUS
  Administered 2015-06-02: 100 ug via INTRAVENOUS
  Filled 2015-06-02: qty 2

## 2015-06-02 MED ORDER — CETYLPYRIDINIUM CHLORIDE 0.05 % MT LIQD
7.0000 mL | Freq: Two times a day (BID) | OROMUCOSAL | Status: DC
  Administered 2015-06-03 – 2015-06-06 (×3): 7 mL via OROMUCOSAL

## 2015-06-02 MED ORDER — EPHEDRINE SULFATE 50 MG/ML IJ SOLN
INTRAMUSCULAR | Status: DC | PRN
  Administered 2015-06-02: 5 mg via INTRAVENOUS

## 2015-06-02 MED ORDER — THROMBIN 20000 UNITS EX SOLR
CUTANEOUS | Status: AC
  Filled 2015-06-02: qty 20000

## 2015-06-02 MED ORDER — EPINEPHRINE HCL 1 MG/ML IJ SOLN: INTRAMUSCULAR | Status: DC | PRN

## 2015-06-02 MED ORDER — SODIUM CHLORIDE 0.9 % IV SOLN
INTRAVENOUS | Status: DC
  Administered 2015-06-02: 10 mL/h via INTRAVENOUS

## 2015-06-02 MED ORDER — SODIUM CHLORIDE 0.45 % IV SOLN
INTRAVENOUS | Status: DC | PRN
  Administered 2015-06-02: 20 mL/h via INTRAVENOUS

## 2015-06-02 MED ORDER — OXYCODONE HCL 5 MG PO TABS
5.0000 mg | ORAL_TABLET | ORAL | Status: DC | PRN
  Administered 2015-06-02 – 2015-06-03 (×6): 10 mg via ORAL
  Administered 2015-06-04 (×6): 5 mg via ORAL
  Filled 2015-06-02: qty 1
  Filled 2015-06-02: qty 2
  Filled 2015-06-02: qty 1
  Filled 2015-06-02 (×2): qty 2
  Filled 2015-06-02 (×3): qty 1
  Filled 2015-06-02 (×3): qty 2
  Filled 2015-06-02 (×2): qty 1

## 2015-06-02 MED ORDER — ANTISEPTIC ORAL RINSE SOLUTION (CORINZ)
7.0000 mL | Freq: Four times a day (QID) | OROMUCOSAL | Status: DC
  Administered 2015-06-06: 7 mL via OROMUCOSAL

## 2015-06-02 MED ORDER — FAMOTIDINE IN NACL 20-0.9 MG/50ML-% IV SOLN
20.0000 mg | Freq: Two times a day (BID) | INTRAVENOUS | Status: AC
  Administered 2015-06-02: 20 mg via INTRAVENOUS
  Filled 2015-06-02: qty 50

## 2015-06-02 MED ORDER — LIDOCAINE HCL (CARDIAC) 20 MG/ML IV SOLN
INTRAVENOUS | Status: DC | PRN
  Administered 2015-06-02: 60 mg via INTRAVENOUS

## 2015-06-02 MED ORDER — VANCOMYCIN HCL IN DEXTROSE 1-5 GM/200ML-% IV SOLN
1000.0000 mg | Freq: Once | INTRAVENOUS | Status: AC
  Administered 2015-06-02: 1000 mg via INTRAVENOUS
  Filled 2015-06-02: qty 200

## 2015-06-02 MED ORDER — MIDAZOLAM HCL 2 MG/2ML IJ SOLN
INTRAMUSCULAR | Status: AC
  Administered 2015-06-02: 2 mg via INTRAVENOUS
  Filled 2015-06-02: qty 2

## 2015-06-02 MED ORDER — MORPHINE SULFATE (PF) 2 MG/ML IV SOLN
1.0000 mg | INTRAVENOUS | Status: DC | PRN
Start: 2015-06-02 — End: 2015-06-02
  Administered 2015-06-02 (×3): 2 mg via INTRAVENOUS
  Filled 2015-06-02: qty 1

## 2015-06-02 MED ORDER — BISACODYL 10 MG RE SUPP: 10.0000 mg | Freq: Every day | RECTAL | Status: DC

## 2015-06-02 MED ORDER — PHENYLEPHRINE HCL 10 MG/ML IJ SOLN
20.0000 mg | INTRAVENOUS | Status: DC | PRN
  Administered 2015-06-02: 10 ug/min via INTRAVENOUS

## 2015-06-02 MED ORDER — MAGNESIUM SULFATE 4 GM/100ML IV SOLN
4.0000 g | Freq: Once | INTRAVENOUS | Status: AC
  Administered 2015-06-02: 4 g via INTRAVENOUS
  Filled 2015-06-02: qty 100

## 2015-06-02 MED ORDER — CHLORHEXIDINE GLUCONATE 0.12 % MT SOLN
15.0000 mL | OROMUCOSAL | Status: AC
  Administered 2015-06-02: 15 mL via OROMUCOSAL
  Filled 2015-06-02: qty 15

## 2015-06-02 MED ORDER — METOPROLOL TARTRATE 12.5 MG HALF TABLET
12.5000 mg | ORAL_TABLET | Freq: Two times a day (BID) | ORAL | Status: DC
  Administered 2015-06-03 – 2015-06-04 (×4): 12.5 mg via ORAL
  Filled 2015-06-02 (×4): qty 1

## 2015-06-02 MED ORDER — ACETAMINOPHEN 160 MG/5ML PO SOLN: 1000.0000 mg | Freq: Four times a day (QID) | ORAL | Status: DC

## 2015-06-02 MED ORDER — SODIUM CHLORIDE 0.9% FLUSH: 3.0000 mL | INTRAVENOUS | Status: DC | PRN

## 2015-06-02 MED ORDER — THROMBIN 20000 UNITS EX SOLR: CUTANEOUS | Status: DC | PRN

## 2015-06-02 MED ORDER — PHENYLEPHRINE HCL 10 MG/ML IJ SOLN
INTRAMUSCULAR | Status: DC | PRN
  Administered 2015-06-02: 40 ug via INTRAVENOUS

## 2015-06-02 MED ORDER — MORPHINE SULFATE (PF) 2 MG/ML IV SOLN
2.0000 mg | INTRAVENOUS | Status: DC | PRN
  Administered 2015-06-02: 2 mg via INTRAVENOUS
  Administered 2015-06-02 – 2015-06-03 (×2): 4 mg via INTRAVENOUS
  Administered 2015-06-03: 2 mg via INTRAVENOUS
  Administered 2015-06-03: 4 mg via INTRAVENOUS
  Administered 2015-06-03: 2 mg via INTRAVENOUS
  Administered 2015-06-03 – 2015-06-04 (×3): 4 mg via INTRAVENOUS
  Filled 2015-06-02 (×2): qty 2
  Filled 2015-06-02 (×2): qty 1
  Filled 2015-06-02 (×2): qty 2
  Filled 2015-06-02 (×2): qty 1
  Filled 2015-06-02 (×3): qty 2

## 2015-06-02 MED ORDER — METOPROLOL TARTRATE 1 MG/ML IV SOLN
2.5000 mg | INTRAVENOUS | Status: DC | PRN
Start: 2015-06-02 — End: 2015-06-05

## 2015-06-02 MED ORDER — PANTOPRAZOLE SODIUM 40 MG PO TBEC: 40.0000 mg | DELAYED_RELEASE_TABLET | Freq: Every day | ORAL | Status: DC

## 2015-06-02 MED ORDER — LACTATED RINGERS IV SOLN: INTRAVENOUS | Status: DC

## 2015-06-02 MED ORDER — SODIUM BICARBONATE 8.4 % IV SOLN
50.0000 meq | Freq: Once | INTRAVENOUS | Status: AC
  Administered 2015-06-02: 50 meq via INTRAVENOUS

## 2015-06-02 MED ORDER — ASPIRIN EC 325 MG PO TBEC
325.0000 mg | DELAYED_RELEASE_TABLET | Freq: Every day | ORAL | Status: DC
  Administered 2015-06-03 – 2015-06-04 (×2): 325 mg via ORAL
  Filled 2015-06-02 (×2): qty 1

## 2015-06-02 MED ORDER — PAPAVERINE HCL 30 MG/ML IJ SOLN
INTRAMUSCULAR | Status: DC | PRN
  Administered 2015-06-02: 09:00:00 via INTRAVASCULAR

## 2015-06-02 MED ORDER — MIDAZOLAM HCL 5 MG/5ML IJ SOLN
INTRAMUSCULAR | Status: DC | PRN
  Administered 2015-06-02: 7 mg via INTRAVENOUS
  Administered 2015-06-02: 3 mg via INTRAVENOUS

## 2015-06-02 MED ORDER — ACETAMINOPHEN 160 MG/5ML PO SOLN: 650.0000 mg | Freq: Once | ORAL | Status: AC

## 2015-06-02 MED ORDER — LEVOFLOXACIN IN D5W 750 MG/150ML IV SOLN
750.0000 mg | INTRAVENOUS | Status: AC
Start: 2015-06-03 — End: 2015-06-03
  Administered 2015-06-03: 750 mg via INTRAVENOUS
  Filled 2015-06-02: qty 150

## 2015-06-02 MED ORDER — 0.9 % SODIUM CHLORIDE (POUR BTL) OPTIME
TOPICAL | Status: DC | PRN
  Administered 2015-06-02: 5000 mL

## 2015-06-02 MED ORDER — PROTAMINE SULFATE 10 MG/ML IV SOLN
INTRAVENOUS | Status: DC | PRN
  Administered 2015-06-02: 30 mg via INTRAVENOUS

## 2015-06-02 MED ORDER — SODIUM CHLORIDE 0.9 % IV SOLN: 250.0000 mL | INTRAVENOUS | Status: DC

## 2015-06-02 MED ORDER — ALBUMIN HUMAN 5 % IV SOLN
250.0000 mL | INTRAVENOUS | Status: AC | PRN
  Administered 2015-06-02 (×2): 250 mL via INTRAVENOUS

## 2015-06-02 MED ORDER — NITROGLYCERIN 0.2 MG/ML ON CALL CATH LAB
INTRAVENOUS | Status: DC | PRN
  Administered 2015-06-02: 10 mg via INTRAVENOUS
  Administered 2015-06-02 (×2): 5 mg via INTRAVENOUS
  Administered 2015-06-02: 10 mg via INTRAVENOUS

## 2015-06-02 MED ORDER — SODIUM CHLORIDE 0.9% FLUSH
3.0000 mL | Freq: Two times a day (BID) | INTRAVENOUS | Status: DC
  Administered 2015-06-03 – 2015-06-04 (×3): 3 mL via INTRAVENOUS

## 2015-06-02 MED ORDER — SODIUM CHLORIDE 0.9 % IV SOLN
INTRAVENOUS | Status: DC
  Administered 2015-06-02: 22:00:00 via INTRAVENOUS
  Filled 2015-06-02 (×2): qty 2.5

## 2015-06-02 MED ORDER — POTASSIUM CHLORIDE 10 MEQ/50ML IV SOLN
10.0000 meq | INTRAVENOUS | Status: AC
  Administered 2015-06-02 (×3): 10 meq via INTRAVENOUS

## 2015-06-02 MED ORDER — ACETAMINOPHEN 500 MG PO TABS
1000.0000 mg | ORAL_TABLET | Freq: Four times a day (QID) | ORAL | Status: DC
  Administered 2015-06-03 – 2015-06-05 (×10): 1000 mg via ORAL
  Filled 2015-06-02 (×10): qty 2

## 2015-06-02 MED ORDER — ALBUMIN HUMAN 5 % IV SOLN
INTRAVENOUS | Status: DC | PRN
  Administered 2015-06-02 (×2): via INTRAVENOUS

## 2015-06-02 MED ORDER — LACTATED RINGERS IV SOLN
INTRAVENOUS | Status: DC | PRN
  Administered 2015-06-02 (×3): via INTRAVENOUS

## 2015-06-02 MED ORDER — POTASSIUM CHLORIDE 10 MEQ/50ML IV SOLN
10.0000 meq | INTRAVENOUS | Status: AC
  Administered 2015-06-02 (×2): 10 meq via INTRAVENOUS

## 2015-06-02 MED ORDER — LACTATED RINGERS IV SOLN
INTRAVENOUS | Status: DC | PRN
  Administered 2015-06-02 (×2): via INTRAVENOUS

## 2015-06-02 MED ORDER — LACTATED RINGERS IV SOLN: 500.0000 mL | Freq: Once | INTRAVENOUS | Status: DC | PRN

## 2015-06-02 MED ORDER — ONDANSETRON HCL 4 MG/2ML IJ SOLN
4.0000 mg | Freq: Four times a day (QID) | INTRAMUSCULAR | Status: DC | PRN
  Administered 2015-06-03: 4 mg via INTRAVENOUS
  Filled 2015-06-02: qty 2

## 2015-06-02 MED ORDER — ACETAMINOPHEN 650 MG RE SUPP
650.0000 mg | Freq: Once | RECTAL | Status: AC
  Administered 2015-06-02: 650 mg via RECTAL

## 2015-06-02 MED ORDER — BISACODYL 5 MG PO TBEC
10.0000 mg | DELAYED_RELEASE_TABLET | Freq: Every day | ORAL | Status: DC
  Administered 2015-06-03 – 2015-06-04 (×2): 10 mg via ORAL
  Filled 2015-06-02 (×2): qty 2

## 2015-06-02 MED ORDER — MIDAZOLAM HCL 2 MG/2ML IJ SOLN: 2.0000 mg | INTRAMUSCULAR | Status: DC | PRN

## 2015-06-02 MED ORDER — INSULIN REGULAR BOLUS VIA INFUSION
0.0000 [IU] | Freq: Three times a day (TID) | INTRAVENOUS | Status: DC
Start: 2015-06-02 — End: 2015-06-05
  Filled 2015-06-02: qty 10

## 2015-06-02 MED FILL — Magnesium Sulfate Inj 50%: INTRAMUSCULAR | Qty: 10 | Status: AC

## 2015-06-02 MED FILL — Heparin Sodium (Porcine) Inj 1000 Unit/ML: INTRAMUSCULAR | Qty: 30 | Status: AC

## 2015-06-02 MED FILL — Potassium Chloride Inj 2 mEq/ML: INTRAVENOUS | Qty: 40 | Status: AC

## 2015-06-02 SURGICAL SUPPLY — 106 items
BAG DECANTER FOR FLEXI CONT (MISCELLANEOUS) ×3 IMPLANT
BANDAGE ACE 4X5 VEL STRL LF (GAUZE/BANDAGES/DRESSINGS) ×3 IMPLANT
BANDAGE ACE 6X5 VEL STRL LF (GAUZE/BANDAGES/DRESSINGS) ×3 IMPLANT
BANDAGE ELASTIC 4 VELCRO ST LF (GAUZE/BANDAGES/DRESSINGS) ×3 IMPLANT
BANDAGE ELASTIC 6 VELCRO ST LF (GAUZE/BANDAGES/DRESSINGS) ×3 IMPLANT
BASKET HEART (ORDER IN 25'S) (MISCELLANEOUS) ×1
BASKET HEART (ORDER IN 25S) (MISCELLANEOUS) ×2 IMPLANT
BLADE STERNUM SYSTEM 6 (BLADE) ×3 IMPLANT
BNDG GAUZE ELAST 4 BULKY (GAUZE/BANDAGES/DRESSINGS) ×3 IMPLANT
CANISTER SUCTION 2500CC (MISCELLANEOUS) ×3 IMPLANT
CATH ROBINSON RED A/P 18FR (CATHETERS) ×6 IMPLANT
CATH THORACIC 28FR (CATHETERS) ×6 IMPLANT
CATH THORACIC 36FR (CATHETERS) ×3 IMPLANT
CATH THORACIC 36FR RT ANG (CATHETERS) ×3 IMPLANT
CLIP TI MEDIUM 24 (CLIP) IMPLANT
CLIP TI WIDE RED SMALL 24 (CLIP) ×3 IMPLANT
CONN Y 3/8X3/8X3/8  BEN (MISCELLANEOUS) ×1
CONN Y 3/8X3/8X3/8 BEN (MISCELLANEOUS) ×2 IMPLANT
COVER SURGICAL LIGHT HANDLE (MISCELLANEOUS) ×3 IMPLANT
CRADLE DONUT ADULT HEAD (MISCELLANEOUS) ×3 IMPLANT
DRAPE CARDIOVASCULAR INCISE (DRAPES) ×1
DRAPE SLUSH/WARMER DISC (DRAPES) ×3 IMPLANT
DRAPE SRG 135X102X78XABS (DRAPES) ×2 IMPLANT
DRSG AQUACEL AG ADV 3.5X14 (GAUZE/BANDAGES/DRESSINGS) ×3 IMPLANT
DRSG COVADERM 4X14 (GAUZE/BANDAGES/DRESSINGS) ×3 IMPLANT
ELECT CAUTERY BLADE 6.4 (BLADE) ×3 IMPLANT
ELECT REM PT RETURN 9FT ADLT (ELECTROSURGICAL) ×6
ELECTRODE REM PT RTRN 9FT ADLT (ELECTROSURGICAL) ×4 IMPLANT
FELT TEFLON 1X6 (MISCELLANEOUS) ×3 IMPLANT
GAUZE SPONGE 4X4 12PLY STRL (GAUZE/BANDAGES/DRESSINGS) ×6 IMPLANT
GLOVE BIO SURGEON STRL SZ 6 (GLOVE) IMPLANT
GLOVE BIO SURGEON STRL SZ 6.5 (GLOVE) ×6 IMPLANT
GLOVE BIO SURGEON STRL SZ7 (GLOVE) IMPLANT
GLOVE BIO SURGEON STRL SZ7.5 (GLOVE) ×9 IMPLANT
GLOVE BIOGEL PI IND STRL 6 (GLOVE) ×6 IMPLANT
GLOVE BIOGEL PI IND STRL 6.5 (GLOVE) ×6 IMPLANT
GLOVE BIOGEL PI IND STRL 7.0 (GLOVE) IMPLANT
GLOVE BIOGEL PI INDICATOR 6 (GLOVE) ×3
GLOVE BIOGEL PI INDICATOR 6.5 (GLOVE) ×3
GLOVE BIOGEL PI INDICATOR 7.0 (GLOVE)
GLOVE EUDERMIC 7 POWDERFREE (GLOVE) ×6 IMPLANT
GLOVE ORTHO TXT STRL SZ7.5 (GLOVE) IMPLANT
GOWN STRL REUS W/ TWL LRG LVL3 (GOWN DISPOSABLE) ×8 IMPLANT
GOWN STRL REUS W/ TWL XL LVL3 (GOWN DISPOSABLE) ×2 IMPLANT
GOWN STRL REUS W/TWL LRG LVL3 (GOWN DISPOSABLE) ×4
GOWN STRL REUS W/TWL XL LVL3 (GOWN DISPOSABLE) ×1
HEMOSTAT POWDER SURGIFOAM 1G (HEMOSTASIS) ×9 IMPLANT
HEMOSTAT SURGICEL 2X14 (HEMOSTASIS) ×3 IMPLANT
INSERT FOGARTY 61MM (MISCELLANEOUS) IMPLANT
INSERT FOGARTY XLG (MISCELLANEOUS) IMPLANT
KIT BASIN OR (CUSTOM PROCEDURE TRAY) ×3 IMPLANT
KIT CATH CPB BARTLE (MISCELLANEOUS) ×3 IMPLANT
KIT ROOM TURNOVER OR (KITS) ×3 IMPLANT
KIT SUCTION CATH 14FR (SUCTIONS) ×3 IMPLANT
KIT VASOVIEW W/TROCAR VH 2000 (KITS) ×3 IMPLANT
NEEDLE 18GX1X1/2 (RX/OR ONLY) (NEEDLE) ×3 IMPLANT
NEEDLE 22X1 1/2 (OR ONLY) (NEEDLE) ×3 IMPLANT
NS IRRIG 1000ML POUR BTL (IV SOLUTION) ×15 IMPLANT
PACK OPEN HEART (CUSTOM PROCEDURE TRAY) ×3 IMPLANT
PAD ARMBOARD 7.5X6 YLW CONV (MISCELLANEOUS) ×6 IMPLANT
PAD ELECT DEFIB RADIOL ZOLL (MISCELLANEOUS) ×3 IMPLANT
PENCIL BUTTON HOLSTER BLD 10FT (ELECTRODE) ×3 IMPLANT
PUNCH AORTIC ROTATE 4.0MM (MISCELLANEOUS) IMPLANT
PUNCH AORTIC ROTATE 4.5MM 8IN (MISCELLANEOUS) ×3 IMPLANT
PUNCH AORTIC ROTATE 5MM 8IN (MISCELLANEOUS) IMPLANT
SET CARDIOPLEGIA MPS 5001102 (MISCELLANEOUS) ×3 IMPLANT
SPONGE INTESTINAL PEANUT (DISPOSABLE) IMPLANT
SPONGE LAP 18X18 X RAY DECT (DISPOSABLE) IMPLANT
SPONGE LAP 4X18 X RAY DECT (DISPOSABLE) ×3 IMPLANT
SUT BONE WAX W31G (SUTURE) ×3 IMPLANT
SUT MNCRL AB 4-0 PS2 18 (SUTURE) IMPLANT
SUT PROLENE 3 0 SH DA (SUTURE) IMPLANT
SUT PROLENE 3 0 SH1 36 (SUTURE) ×3 IMPLANT
SUT PROLENE 4 0 RB 1 (SUTURE)
SUT PROLENE 4 0 SH DA (SUTURE) IMPLANT
SUT PROLENE 4-0 RB1 .5 CRCL 36 (SUTURE) IMPLANT
SUT PROLENE 5 0 C 1 36 (SUTURE) IMPLANT
SUT PROLENE 6 0 C 1 30 (SUTURE) IMPLANT
SUT PROLENE 7 0 BV 1 (SUTURE) ×3 IMPLANT
SUT PROLENE 7 0 BV1 MDA (SUTURE) ×3 IMPLANT
SUT PROLENE 8 0 BV175 6 (SUTURE) ×6 IMPLANT
SUT SILK  1 MH (SUTURE)
SUT SILK 1 MH (SUTURE) IMPLANT
SUT SILK 2 0 SH (SUTURE) ×3 IMPLANT
SUT STEEL STERNAL CCS#1 18IN (SUTURE) IMPLANT
SUT STEEL SZ 6 DBL 3X14 BALL (SUTURE) ×9 IMPLANT
SUT VIC AB 1 CTX 36 (SUTURE) ×2
SUT VIC AB 1 CTX36XBRD ANBCTR (SUTURE) ×4 IMPLANT
SUT VIC AB 2-0 CT1 27 (SUTURE)
SUT VIC AB 2-0 CT1 TAPERPNT 27 (SUTURE) IMPLANT
SUT VIC AB 2-0 CTX 27 (SUTURE) ×3 IMPLANT
SUT VIC AB 3-0 SH 27 (SUTURE)
SUT VIC AB 3-0 SH 27X BRD (SUTURE) IMPLANT
SUT VIC AB 3-0 X1 27 (SUTURE) ×3 IMPLANT
SUT VICRYL 4-0 PS2 18IN ABS (SUTURE) IMPLANT
SUTURE E-PAK OPEN HEART (SUTURE) ×3 IMPLANT
SYR 20ML ECCENTRIC (SYRINGE) ×3 IMPLANT
SYSTEM SAHARA CHEST DRAIN ATS (WOUND CARE) ×3 IMPLANT
TAPE CLOTH SURG 4X10 WHT LF (GAUZE/BANDAGES/DRESSINGS) ×3 IMPLANT
TAPE PAPER 3X10 WHT MICROPORE (GAUZE/BANDAGES/DRESSINGS) ×3 IMPLANT
TOWEL OR 17X24 6PK STRL BLUE (TOWEL DISPOSABLE) ×3 IMPLANT
TOWEL OR 17X26 10 PK STRL BLUE (TOWEL DISPOSABLE) ×3 IMPLANT
TRAY FOLEY IC TEMP SENS 16FR (CATHETERS) ×3 IMPLANT
TUBING INSUFFLATION (TUBING) ×3 IMPLANT
UNDERPAD 30X30 INCONTINENT (UNDERPADS AND DIAPERS) ×3 IMPLANT
WATER STERILE IRR 1000ML POUR (IV SOLUTION) ×6 IMPLANT

## 2015-06-02 NOTE — OR Nursing (Signed)
[id number] chest incision made

## 2015-06-02 NOTE — Progress Notes (Signed)
  Echocardiogram Echocardiogram Transesophageal has been performed.  Donald Oliver 06/02/2015, 9:56 AM

## 2015-06-02 NOTE — Anesthesia Preprocedure Evaluation (Signed)
Anesthesia Evaluation  Patient identified by MRN, date of birth, ID band Patient awake    Airway Mallampati: II  TM Distance: >3 FB Neck ROM: Full    Dental   Pulmonary Current Smoker,    breath sounds clear to auscultation       Cardiovascular hypertension, + angina + CAD and + Past MI   Rhythm:Regular Rate:Normal     Neuro/Psych    GI/Hepatic GERD  ,  Endo/Other    Renal/GU      Musculoskeletal   Abdominal   Peds  Hematology   Anesthesia Other Findings   Reproductive/Obstetrics                             Anesthesia Physical Anesthesia Plan  ASA: III  Anesthesia Plan: General   Post-op Pain Management:    Induction: Intravenous  Airway Management Planned: Oral ETT  Additional Equipment: PA Cath, Arterial line, Ultrasound Guidance Line Placement and TEE  Intra-op Plan:   Post-operative Plan:   Informed Consent: I have reviewed the patients History and Physical, chart, labs and discussed the procedure including the risks, benefits and alternatives for the proposed anesthesia with the patient or authorized representative who has indicated his/her understanding and acceptance.   Dental advisory given  Plan Discussed with: CRNA and Anesthesiologist  Anesthesia Plan Comments:         Anesthesia Quick Evaluation

## 2015-06-02 NOTE — Transfer of Care (Signed)
Immediate Anesthesia Transfer of Care Note  Patient: Donald Oliver  Procedure(s) Performed: Procedure(s): CORONARY ARTERY BYPASS GRAFTING (CABG) x  3 utilizing bilateral internal mammary artery and endoscopically harvested right sapheneous vein. (N/A) TRANSESOPHAGEAL ECHOCARDIOGRAM (TEE) (N/A)  Patient Location: SICU  Anesthesia Type:General  Level of Consciousness: Patient remains intubated per anesthesia plan  Airway & Oxygen Therapy: Patient remains intubated per anesthesia plan  Post-op Assessment: Report given to RN and Post -op Vital signs reviewed and stable  Post vital signs: Reviewed and stable  Last Vitals:  Filed Vitals:   06/02/15 0400 06/02/15 0500  BP: 105/67   Pulse: 63   Temp: 36.4 C   Resp: 15 16    Complications: No apparent anesthesia complications

## 2015-06-02 NOTE — Anesthesia Procedure Notes (Addendum)
Central Venous Catheter Insertion Performed by: anesthesiologist Patient location: Pre-op. Preanesthetic checklist: patient identified, IV checked, site marked, risks and benefits discussed, surgical consent, monitors and equipment checked, pre-op evaluation, timeout performed and anesthesia consent Lidocaine 1% used for infiltration Landmarks identified Catheter size: 8.5 Fr Central line was placed.Sheath introducer Procedure performed using ultrasound guided technique. Attempts: 1 Following insertion, line sutured and dressing applied. Post procedure assessment: blood return through all ports, free fluid flow and no air. Patient tolerated the procedure well with no immediate complications.    Central Venous Catheter Insertion Performed by: anesthesiologist Patient location: Pre-op. Preanesthetic checklist: patient identified, IV checked, site marked, risks and benefits discussed, surgical consent, monitors and equipment checked, pre-op evaluation, timeout performed and anesthesia consent Landmarks identified PA cath was placed.Swan type and PA catheter depth:thermodilation and 50PA Cath depth:50 Procedure performed using ultrasound guided technique. Attempts: 1 Patient tolerated the procedure well with no immediate complications.    Procedure Name: Intubation Date/Time: 06/02/2015 7:58 AM Performed by: Clearnce Sorrel Pre-anesthesia Checklist: Patient identified, Emergency Drugs available, Suction available, Patient being monitored and Timeout performed Patient Re-evaluated:Patient Re-evaluated prior to inductionOxygen Delivery Method: Circle system utilized Preoxygenation: Pre-oxygenation with 100% oxygen Intubation Type: Cricoid Pressure applied and IV induction

## 2015-06-02 NOTE — Brief Op Note (Signed)
05/29/2015 - 06/02/2015  11:34 AM      [address]       [place] [address]             [phone]     05/29/2015 - 06/02/2015  11:34 AM  PATIENT:  Donald Oliver  45 y.o. male  PRE-OPERATIVE DIAGNOSIS:  CAD  POST-OPERATIVE DIAGNOSIS:  CAD  PROCEDURE:  Procedure(s): CORONARY ARTERY BYPASS GRAFTING (CABG) x  3 utilizing bilateral internal mammary artery and endoscopically harvested right sapheneous vein.. (LIMA-LAD; FRIMA-DIAD; SVG-PDA) TRANSESOPHAGEAL ECHOCARDIOGRAM (TEE)  SURGEON:  Surgeon(s): [name], MD  PHYSICIAN ASSISTANT: Arnette Driggs PA-C  ANESTHESIA:   general  PATIENT CONDITION:  ICU - intubated and hemodynamically stable.  PRE-OPERATIVE WEIGHT: [id number]  COMPLICATIONS: NO KNOWN

## 2015-06-02 NOTE — Progress Notes (Signed)
[facility] BRIEF SICU PROGRESS NOTE  Day of Surgery  S/P Procedure(s) (LRB): CORONARY ARTERY BYPASS GRAFTING (CABG) x  3 utilizing bilateral internal mammary artery and endoscopically harvested right sapheneous vein. (N/A) TRANSESOPHAGEAL ECHOCARDIOGRAM (TEE) (N/A)   Extubated uneventfully NSR w/ stable hemodynamics O2 sats 96% on 4 L/min Chest tube output low UOP excellent Labs okay  Plan: Continue routine early postop  Rexene Alberts, MD 06/02/2015 7:12 PM

## 2015-06-02 NOTE — Procedures (Signed)
Extubation Procedure Note  Patient Details:   Name: Donald Oliver DOB: Feb 04, 1971 MRN: [id number]   Airway Documentation:     Evaluation  O2 sats: stable throughout Complications: No apparent complications Patient did tolerate procedure well. Bilateral Breath Sounds: Clear, Diminished   Yes  [id number] Placed on 4l/min [place] Incentive instructed, poor effort due to pain   Revonda Standard 06/02/2015, 5:42 PM

## 2015-06-02 NOTE — Op Note (Signed)
CARDIOVASCULAR SURGERY OPERATIVE NOTE  06/02/2015  Surgeon:  [name], MD  First Assistant: Jadene Pierini,  PA-C   Preoperative Diagnosis:  Severe multi-vessel coronary artery disease   Postoperative Diagnosis:  Same   Procedure:  1. Median Sternotomy 2. Extracorporeal circulation 3.   Coronary artery bypass grafting x 3   Left internal mammary graft to the LAD  Free right internal mammary graft to the diagnonal  SVG to PDA   4.   Endoscopic vein harvest from the right leg   Anesthesia:  General Endotracheal   Clinical History/Surgical Indication:  The patient is a 45 year old smoker with a history of hypertension, hyperlipidemia, and premature coronary disease who had a negative treadmill stress test in 01/2014 and reports being in his usual state of health until about a month ago when he began having substernal tightness radiating to the inner part of both arms with exertion. It has become more frequent and has been occuring with less exertion. Prior to admission he had a prolonged episode at rest. He did not want to go to the ER so he toughed it out thinking that it might be reflux but when it became persistent he came to the ER. Cath yesterday afternoon showed severe 3 vessel CAD with the culprit appearing to be an occluded large first diagonal branch. This was opened with PTCA without stenting. There was also a 75% mid LAD stenosis with an FFR of 0.69. The LCX has 50% proximal stenosis. The RCA has diffuse disease and is totally occlude distally with left to right collat to the PDA and PL branches that look small but could be underfilled.   He has severe multivessel coronary artery disease.  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 I  would plan to use both IMA's and probably a vein to the distal RCA. I discussed the operative procedure with the patient and his wife including alternatives, benefits and risks; including but not limited to bleeding, blood transfusion, infection, stroke, myocardial infarction, graft failure, heart block requiring a permanent pacemaker, organ dysfunction, and death. [name] understands and agrees to proceed.   Preparation:  The patient was seen in the preoperative holding area and the correct patient, correct operation were confirmed with the patient after reviewing the medical record and catheterization. The consent was signed by me. Preoperative antibiotics were given. A pulmonary arterial line and radial arterial line were placed by the anesthesia team. The patient was taken back to the operating room and positioned supine on the operating room table. After being placed under general endotracheal anesthesia by the anesthesia team a foley catheter was placed. The neck, chest, abdomen, and both legs were prepped with betadine soap and solution and draped in the usual sterile manner. A surgical time-out was taken and the correct patient and operative procedure were confirmed with the nursing and anesthesia staff.   Cardiopulmonary Bypass:  A median sternotomy was performed. The pericardium was opened in the midline. Right ventricular function appeared normal. The ascending aorta was of normal size and had no palpable plaque. There were no contraindications to aortic cannulation or cross-clamping. The patient was fully systemically heparinized and the ACT was maintained > 400 sec. The proximal aortic arch was cannulated with a 20 F aortic cannula for arterial inflow. Venous cannulation was performed via the right  atrial appendage using a two-staged venous cannula. An antegrade cardioplegia/vent cannula was inserted into the mid-ascending aorta. Aortic occlusion was performed with a single  cross-clamp. Systemic cooling to 32 degrees Centigrade and topical cooling of the heart with iced saline were used. Hyperkalemic antegrade cold blood cardioplegia was used to induce diastolic arrest and was then given at about 20 minute intervals throughout the period of arrest to maintain myocardial temperature at or below 10 degrees centigrade. A temperature probe was inserted into the interventricular septum and an insulating pad was placed in the pericardium.   Left internal mammary harvest:  The left internal mammary was a small caliber vessel with good blood flow. It was harvested as a pedicle graft and all branches were clipped. The distal end was ligated with a heavy silk tie and divided. It was sprayed with papaverine to prevent vasospasm.  Right internal mammary harvest:  The right internal mammary was a small caliber vessel with good blood flow. It was harvested as a free graft since it was not long enough to reach any of the vessels that I was grafting. All branches were clipped. The distal end was ligated with a heavy silk tie and divided. It was sprayed with papaverine to prevent vasospasm. The proximal end was suture ligated and divided just before the graft was being used.   Endoscopic vein harvest:  The right greater saphenous vein was harvested endoscopically through a 2 cm incision medial to the right knee. It was harvested from the upper thigh to below the knee. It was a medium-sized vein of good quality. The side branches were all ligated with 4-0 silk ties.    Coronary arteries:  The coronary arteries were examined.   LAD:  Large vessel that was heavily diseased throughout the proximal and mid-portions with patchy plaque extending out into the distal vessel. The diagonal was a moderate sized vessel that was diffusely diseased with plaque  LCX:  Moderate sized OM branch with patchy plaque.  RCA:  Diffusely diseased with calcific plaque. The PDA was small but graftable  with no significant disease in it and supplied both small PL branches on cath.   Grafts:  1. LIMA to the LAD: 2.0 mm. It was sewn end to side using 8-0 prolene continuous suture. 2. Free RIMA to diagonal:  1.6 mm. It was sewn end to side using 8-0 prolene continuous suture. 3. SVG to PDA:  1.6 mm. It was sewn end to side using 7-0 prolene continuous suture.   The proximal vein graft anastomosis was performed to the mid-ascending aorta using continuous 6-0 prolene suture. A graft marker as placed around the proximal anastomosis. The proximal anastomosis of the RIMA graft was performed to the hood of the vein graft since the RIMA was small and the aorta was thickened. It was sutured with continuous 7-0 prolene.    Completion:  The patient was rewarmed to 37 degrees Centigrade. The clamp was removed from the LIMA pedicle and there was rapid warming of the septum and return of ventricular fibrillation. The crossclamp was removed with a time of 83 minutes. There was spontaneous return of sinus rhythm. The distal and proximal anastomoses were checked for hemostasis. The position of the grafts was satisfactory. Two temporary epicardial pacing wires were placed on the right atrium and two on the right ventricle. The patient was weaned from CPB without difficulty on no inotropes. CPB time was 99 minutes. Cardiac output was 5 LPM. Heparin was fully reversed with protamine and  the aortic and venous cannulas removed. Hemostasis was achieved. Mediastinal and left pleural drainage tubes were placed. The sternum was closed with double #6 stainless steel wires. The fascia was closed with continuous # 1 vicryl suture. The subcutaneous tissue was closed with 2-0 vicryl continuous suture. The skin was closed with 3-0 vicryl subcuticular suture. All sponge, needle, and instrument counts were reported correct at the end of the case. Dry sterile dressings were placed over the incisions and around the chest tubes which were  connected to pleurevac suction. The patient was then transported to the surgical intensive care unit in critical but stable condition.

## 2015-06-03 ENCOUNTER — Encounter (HOSPITAL_COMMUNITY): Payer: Self-pay | Admitting: Surgery

## 2015-06-03 ENCOUNTER — Inpatient Hospital Stay (HOSPITAL_COMMUNITY): Payer: BLUE CROSS/BLUE SHIELD

## 2015-06-03 LAB — BASIC METABOLIC PANEL
ANION GAP: 7 (ref 5–15)
BUN: 9 mg/dL (ref 6–20)
CHLORIDE: 109 mmol/L (ref 101–111)
CO2: 24 mmol/L (ref 22–32)
Calcium: 7.5 mg/dL — ABNORMAL LOW (ref 8.9–10.3)
Creatinine, Ser: 0.78 mg/dL (ref 0.61–1.24)
Glucose, Bld: 114 mg/dL — ABNORMAL HIGH (ref 65–99)
POTASSIUM: 3.7 mmol/L (ref 3.5–5.1)
SODIUM: 140 mmol/L (ref 135–145)

## 2015-06-03 LAB — CBC
HCT: 32 % — ABNORMAL LOW (ref 39.0–52.0)
HCT: 32.3 % — ABNORMAL LOW (ref 39.0–52.0)
HEMOGLOBIN: 10.8 g/dL — AB (ref 13.0–17.0)
HEMOGLOBIN: 11.2 g/dL — AB (ref 13.0–17.0)
MCH: 29.3 pg (ref 26.0–34.0)
MCH: 30.6 pg (ref 26.0–34.0)
MCHC: 33.8 g/dL (ref 30.0–36.0)
MCHC: 34.7 g/dL (ref 30.0–36.0)
MCV: 87 fL (ref 78.0–100.0)
MCV: 88.3 fL (ref 78.0–100.0)
PLATELETS: 162 10*3/uL (ref 150–400)
PLATELETS: 150 10*3/uL (ref 150–400)
RBC: 3.68 MIL/uL — AB (ref 4.22–5.81)
RBC: 3.66 MIL/uL — AB (ref 4.22–5.81)
RDW: 13.9 % (ref 11.5–15.5)
RDW: 14.4 % (ref 11.5–15.5)
WBC: 13.6 10*3/uL — AB (ref 4.0–10.5)
WBC: 13.5 10*3/uL — AB (ref 4.0–10.5)

## 2015-06-03 LAB — GLUCOSE, CAPILLARY
GLUCOSE-CAPILLARY: 101 mg/dL — AB (ref 65–99)
GLUCOSE-CAPILLARY: 101 mg/dL — AB (ref 65–99)
GLUCOSE-CAPILLARY: 108 mg/dL — AB (ref 65–99)
GLUCOSE-CAPILLARY: 108 mg/dL — AB (ref 65–99)
GLUCOSE-CAPILLARY: 110 mg/dL — AB (ref 65–99)
GLUCOSE-CAPILLARY: 111 mg/dL — AB (ref 65–99)
GLUCOSE-CAPILLARY: 113 mg/dL — AB (ref 65–99)
GLUCOSE-CAPILLARY: 113 mg/dL — AB (ref 65–99)
GLUCOSE-CAPILLARY: 114 mg/dL — AB (ref 65–99)
GLUCOSE-CAPILLARY: 114 mg/dL — AB (ref 65–99)
GLUCOSE-CAPILLARY: 117 mg/dL — AB (ref 65–99)
GLUCOSE-CAPILLARY: 120 mg/dL — AB (ref 65–99)
GLUCOSE-CAPILLARY: 120 mg/dL — AB (ref 65–99)
Glucose-Capillary: 95 mg/dL (ref 65–99)
Glucose-Capillary: 121 mg/dL — ABNORMAL HIGH (ref 65–99)
Glucose-Capillary: 144 mg/dL — ABNORMAL HIGH (ref 65–99)
Glucose-Capillary: 105 mg/dL — ABNORMAL HIGH (ref 65–99)
Glucose-Capillary: 109 mg/dL — ABNORMAL HIGH (ref 65–99)
Glucose-Capillary: 115 mg/dL — ABNORMAL HIGH (ref 65–99)
Glucose-Capillary: 110 mg/dL — ABNORMAL HIGH (ref 65–99)
Glucose-Capillary: 106 mg/dL — ABNORMAL HIGH (ref 65–99)
Glucose-Capillary: 116 mg/dL — ABNORMAL HIGH (ref 65–99)
Glucose-Capillary: 121 mg/dL — ABNORMAL HIGH (ref 65–99)

## 2015-06-03 LAB — MAGNESIUM
MAGNESIUM: 2.1 mg/dL (ref 1.7–2.4)
MAGNESIUM: 2 mg/dL (ref 1.7–2.4)

## 2015-06-03 LAB — POCT I-STAT, CHEM 8
BUN: 16 mg/dL (ref 6–20)
CALCIUM ION: 1.13 mmol/L (ref 1.12–1.23)
CREATININE: 0.9 mg/dL (ref 0.61–1.24)
Chloride: 99 mmol/L — ABNORMAL LOW (ref 101–111)
GLUCOSE: 125 mg/dL — AB (ref 65–99)
HCT: 32 % — ABNORMAL LOW (ref 39.0–52.0)
HEMOGLOBIN: 10.9 g/dL — AB (ref 13.0–17.0)
Potassium: 3.9 mmol/L (ref 3.5–5.1)
Sodium: 138 mmol/L (ref 135–145)
TCO2: 25 mmol/L (ref 0–100)

## 2015-06-03 LAB — CREATININE, SERUM: CREATININE: 0.97 mg/dL (ref 0.61–1.24)

## 2015-06-03 LAB — HEMOGLOBIN A1C
HEMOGLOBIN A1C: 5.7 % — AB (ref 4.8–5.6)
Mean Plasma Glucose: 117 mg/dL

## 2015-06-03 MED ORDER — FUROSEMIDE 10 MG/ML IJ SOLN
40.0000 mg | Freq: Once | INTRAMUSCULAR | Status: AC
  Administered 2015-06-03: 40 mg via INTRAVENOUS
  Filled 2015-06-03: qty 4

## 2015-06-03 MED ORDER — ENOXAPARIN SODIUM 40 MG/0.4ML ~~LOC~~ SOLN
40.0000 mg | Freq: Every day | SUBCUTANEOUS | Status: DC
  Administered 2015-06-03 – 2015-06-05 (×3): 40 mg via SUBCUTANEOUS
  Filled 2015-06-03 (×3): qty 0.4

## 2015-06-03 MED ORDER — NICOTINE 21 MG/24HR TD PT24
21.0000 mg | MEDICATED_PATCH | Freq: Every day | TRANSDERMAL | Status: DC
  Administered 2015-06-03 – 2015-06-06 (×4): 21 mg via TRANSDERMAL
  Filled 2015-06-03 (×4): qty 1

## 2015-06-03 MED ORDER — ALPRAZOLAM 0.5 MG PO TABS
0.5000 mg | ORAL_TABLET | Freq: Every evening | ORAL | Status: DC | PRN
  Administered 2015-06-03: 0.5 mg via ORAL
  Filled 2015-06-03: qty 1

## 2015-06-03 MED ORDER — INSULIN ASPART 100 UNIT/ML ~~LOC~~ SOLN
0.0000 [IU] | SUBCUTANEOUS | Status: DC
  Administered 2015-06-03: 2 [IU] via SUBCUTANEOUS

## 2015-06-03 MED ORDER — POTASSIUM CHLORIDE 10 MEQ/50ML IV SOLN
10.0000 meq | INTRAVENOUS | Status: AC
Start: 2015-06-03 — End: 2015-06-03
  Administered 2015-06-03 (×3): 10 meq via INTRAVENOUS
  Filled 2015-06-03 (×3): qty 50

## 2015-06-03 MED ORDER — INSULIN DETEMIR 100 UNIT/ML ~~LOC~~ SOLN
15.0000 [IU] | Freq: Every day | SUBCUTANEOUS | Status: AC
  Administered 2015-06-03: 15 [IU] via SUBCUTANEOUS
  Filled 2015-06-03: qty 0.15

## 2015-06-03 MED ORDER — FAMOTIDINE 20 MG PO TABS
20.0000 mg | ORAL_TABLET | Freq: Two times a day (BID) | ORAL | Status: DC
  Administered 2015-06-03 – 2015-06-06 (×6): 20 mg via ORAL
  Filled 2015-06-03 (×6): qty 1

## 2015-06-03 MED ORDER — POTASSIUM CHLORIDE CRYS ER 20 MEQ PO TBCR
40.0000 meq | EXTENDED_RELEASE_TABLET | Freq: Once | ORAL | Status: AC
  Administered 2015-06-03: 40 meq via ORAL
  Filled 2015-06-03: qty 2

## 2015-06-03 MED ORDER — INSULIN DETEMIR 100 UNIT/ML ~~LOC~~ SOLN
10.0000 [IU] | Freq: Every day | SUBCUTANEOUS | Status: DC
  Administered 2015-06-04: 10 [IU] via SUBCUTANEOUS
  Filled 2015-06-03 (×2): qty 0.1

## 2015-06-03 MED ORDER — CALCIUM CARBONATE ANTACID 500 MG PO CHEW: 2.0000 | CHEWABLE_TABLET | Freq: Three times a day (TID) | ORAL | Status: DC | PRN

## 2015-06-03 MED ORDER — MIDAZOLAM HCL 2 MG/2ML IJ SOLN
2.0000 mg | Freq: Once | INTRAMUSCULAR | Status: AC
Start: 2015-06-03 — End: 2015-06-03
  Administered 2015-06-03: 2 mg via INTRAVENOUS
  Filled 2015-06-03: qty 2

## 2015-06-03 MED ORDER — KETOROLAC TROMETHAMINE 15 MG/ML IJ SOLN
15.0000 mg | Freq: Four times a day (QID) | INTRAMUSCULAR | Status: DC | PRN
  Administered 2015-06-03 – 2015-06-04 (×3): 15 mg via INTRAVENOUS
  Filled 2015-06-03 (×3): qty 1

## 2015-06-03 MED FILL — Heparin Sodium (Porcine) Inj 1000 Unit/ML: INTRAMUSCULAR | Qty: 10 | Status: AC

## 2015-06-03 MED FILL — Lidocaine HCl IV Inj 20 MG/ML: INTRAVENOUS | Qty: 5 | Status: AC

## 2015-06-03 MED FILL — Mannitol IV Soln 20%: INTRAVENOUS | Qty: 500 | Status: AC

## 2015-06-03 MED FILL — Sodium Chloride IV Soln 0.9%: INTRAVENOUS | Qty: 2000 | Status: AC

## 2015-06-03 MED FILL — Electrolyte-R (PH 7.4) Solution: INTRAVENOUS | Qty: 4000 | Status: AC

## 2015-06-03 MED FILL — Sodium Bicarbonate IV Soln 8.4%: INTRAVENOUS | Qty: 50 | Status: AC

## 2015-06-03 NOTE — Anesthesia Postprocedure Evaluation (Signed)
Anesthesia Post Note  Patient: Donald Oliver  Procedure(s) Performed: Procedure(s) (LRB): CORONARY ARTERY BYPASS GRAFTING (CABG) x  3 utilizing bilateral internal mammary artery and endoscopically harvested right sapheneous vein. (N/A) TRANSESOPHAGEAL ECHOCARDIOGRAM (TEE) (N/A)  Patient location during evaluation: SICU Anesthesia Type: General Level of consciousness: patient remains intubated per anesthesia plan Pain management: pain level controlled Vital Signs Assessment: post-procedure vital signs reviewed and stable Respiratory status: patient remains intubated per anesthesia plan Cardiovascular status: stable Anesthetic complications: no    Last Vitals:  Filed Vitals:   06/03/15 0945 06/03/15 1000  BP: 123/70 111/60  Pulse: 77 73  Temp: 37.1 C 37.1 C  Resp: 18 12    Last Pain:  Filed Vitals:   06/03/15 1004  PainSc: Asleep                 EDWARDS,Nadya Hopwood

## 2015-06-03 NOTE — Progress Notes (Signed)
CT surgery p.m. Rounds  Patient did well today but is very anxious Chest tubes removed, ambulated, maintaining sinus rhythm P.m. labs reviewed and are satisfactory He is focused on his anxiety-will dose 0.5 mg Xanax daily at bedtime when necessary

## 2015-06-03 NOTE — Progress Notes (Signed)
1 Day Post-Op Procedure(s) (LRB): CORONARY ARTERY BYPASS GRAFTING (CABG) x  3 utilizing bilateral internal mammary artery and endoscopically harvested right sapheneous vein. (N/A) TRANSESOPHAGEAL ECHOCARDIOGRAM (TEE) (N/A) Subjective:  Complains of pain   Objective: Vital signs in last 24 hours: Temp:  [97.5 F (36.4 C)-100.9 F (38.3 C)] 98.8 F (37.1 C) (03/08 0900) Pulse Rate:  [60-104] 75 (03/08 0900) Cardiac Rhythm:  [-] Normal sinus rhythm (03/08 0730) Resp:  [5-35] 21 (03/08 0900) BP: (77-117)/(48-78) 108/64 mmHg (03/08 0900) SpO2:  [89 %-100 %] 98 % (03/08 0900) Arterial Line BP: (71-135)/(40-95) 113/52 mmHg (03/08 0800) FiO2 (%):  [40 %-50 %] 40 % (03/07 1639) Weight:  [88 kg (194 lb 0.1 oz)] 88 kg (194 lb 0.1 oz) (03/08 0500)  Hemodynamic parameters for last 24 hours: PAP: (16-49)/(6-34) 45/18 mmHg CO:  [4.2 L/min-8.5 L/min] 4.3 L/min CI:  [2.1 L/min/m2-4.3 L/min/m2] 2.1 L/min/m2  Intake/Output from previous day: 03/07 0701 - 03/08 0700 In: 7735.4 [P.O.:660; I.V.:4543.4; Blood:582; IV [id number] Out: [id number] [Urine:4580; Emesis/NG output:130; Blood:1150; Chest Tube:570] Intake/Output this shift: Total I/O In: 144.8 [I.V.:44.8; IV Piggyback:100] Out: 45 [Urine:15; Chest Tube:30]  General appearance: alert and cooperative Heart: regular rate and rhythm, S1, S2 normal, no murmur, click, rub or gallop Lungs: clear to auscultation bilaterally Extremities: edema mild Wound: dressings dry  Lab Results:  Recent Labs  06/02/15 1830 06/03/15 0415  WBC 17.1* 13.6*  HGB 12.1* 10.8*  HCT 33.6* 32.0*  PLT 165 162   BMET:  Recent Labs  06/02/15 0533  06/02/15 1825 06/02/15 1830 06/03/15 0415  NA 138  < > 142  --  140  K 3.6  < > 4.1  --  3.7  CL 106  < > 108  --  109  CO2 22  --   --   --  24  GLUCOSE 98  < > 125*  --  114*  BUN 12  < > 7  --  9  CREATININE 0.62  < > 0.70 0.82 0.78  CALCIUM 8.9  --   --   --  7.5*  < > = values in this interval not  displayed.  PT/INR:  Recent Labs  06/02/15 1343  LABPROT 15.7*  INR 1.23   ABG    Component Value Date/Time   PHART 7.325* 06/02/2015 1820   HCO3 23.8 06/02/2015 1820   TCO2 23 06/02/2015 1825   ACIDBASEDEF 2.0 06/02/2015 1820   O2SAT 89.0 06/02/2015 1820   CBG (last 3)   Recent Labs  06/03/15 0418 06/03/15 0502 06/03/15 0724  GLUCAP 110* 120* 120*   CXR: bibasilar atelectasis  ECG: NSR, old IMI  Assessment/Plan: S/P Procedure(s) (LRB): CORONARY ARTERY BYPASS GRAFTING (CABG) x  3 utilizing bilateral internal mammary artery and endoscopically harvested right sapheneous vein. (N/A) TRANSESOPHAGEAL ECHOCARDIOGRAM (TEE) (N/A)  He is hemodynamically stable in sinus rhythm Mobilize Diuresis Diabetes control: start Levemir and SSI. d/c tubes/lines Toradol for pain control Continue foley due to patient in ICU and urinary output monitoring See progression orders   LOS: 5 days    Donald Oliver 06/03/2015

## 2015-06-04 ENCOUNTER — Inpatient Hospital Stay (HOSPITAL_COMMUNITY): Payer: BLUE CROSS/BLUE SHIELD

## 2015-06-04 LAB — GLUCOSE, CAPILLARY
GLUCOSE-CAPILLARY: 107 mg/dL — AB (ref 65–99)
GLUCOSE-CAPILLARY: 91 mg/dL (ref 65–99)
GLUCOSE-CAPILLARY: 94 mg/dL (ref 65–99)
Glucose-Capillary: 100 mg/dL — ABNORMAL HIGH (ref 65–99)
Glucose-Capillary: 108 mg/dL — ABNORMAL HIGH (ref 65–99)
Glucose-Capillary: 109 mg/dL — ABNORMAL HIGH (ref 65–99)
Glucose-Capillary: 109 mg/dL — ABNORMAL HIGH (ref 65–99)
Glucose-Capillary: 97 mg/dL (ref 65–99)

## 2015-06-04 LAB — CBC
HCT: 28.6 % — ABNORMAL LOW (ref 39.0–52.0)
Hemoglobin: 10 g/dL — ABNORMAL LOW (ref 13.0–17.0)
MCH: 30.8 pg (ref 26.0–34.0)
MCHC: 35 g/dL (ref 30.0–36.0)
MCV: 88 fL (ref 78.0–100.0)
PLATELETS: 125 10*3/uL — AB (ref 150–400)
RBC: 3.25 MIL/uL — AB (ref 4.22–5.81)
RDW: 14.4 % (ref 11.5–15.5)
WBC: 10.7 10*3/uL — AB (ref 4.0–10.5)

## 2015-06-04 LAB — BASIC METABOLIC PANEL
Anion gap: 8 (ref 5–15)
BUN: 12 mg/dL (ref 6–20)
CALCIUM: 7.8 mg/dL — AB (ref 8.9–10.3)
CO2: 25 mmol/L (ref 22–32)
CREATININE: 0.85 mg/dL (ref 0.61–1.24)
Chloride: 102 mmol/L (ref 101–111)
Glucose, Bld: 113 mg/dL — ABNORMAL HIGH (ref 65–99)
Potassium: 3.6 mmol/L (ref 3.5–5.1)
SODIUM: 135 mmol/L (ref 135–145)

## 2015-06-04 MED ORDER — ALPRAZOLAM 0.25 MG PO TABS
0.2500 mg | ORAL_TABLET | Freq: Three times a day (TID) | ORAL | Status: DC | PRN
  Administered 2015-06-04: 0.25 mg via ORAL
  Filled 2015-06-04: qty 1

## 2015-06-04 MED ORDER — POTASSIUM CHLORIDE 10 MEQ/50ML IV SOLN
10.0000 meq | INTRAVENOUS | Status: AC
  Administered 2015-06-04 (×3): 10 meq via INTRAVENOUS
  Filled 2015-06-04: qty 50

## 2015-06-04 NOTE — Care Management Note (Addendum)
Case Management Note  Patient Details  Name: Donald Oliver MRN: [id number] Date of Birth: 12/13/1970  Subjective/Objective:    S/p CABG                Action/Plan:  Liaison from [facility] called CM back requesting that clinicals be sent tomorrow 3/10 post MD rounding as pt is only approved through 3/9 for coverage of stay, authorization number [id number].  Jackqulyn Livings can be reached at 1 [phone].  Pt has moved to another unit, new unit CM informed of request.   PTA - independent.  Pt lives with spouse and spouse will provide 24 supervision post discharge.  CM contacted by Jackqulyn Livings from [employer] - per resource; [place] is afflicted with [employer]; resource requested clinicals , CM faxed requested clinicals to liaison at [phone].  CM will continue to monitor for discharge needs   Expected Discharge Date:                  Expected Discharge Plan:  Home/Self Care  In-House Referral:     Discharge planning Services  CM Consult  Post Acute Care Choice:    Choice offered to:     DME Arranged:    DME Agency:     HH Arranged:    HH Agency:     Status of Service:  In process, will continue to follow  Medicare Important Message Given:    Date Medicare IM Given:    Medicare IM give by:    Date Additional Medicare IM Given:    Additional Medicare Important Message give by:     If discussed at [place] of Stay Meetings, dates discussed:    Additional Comments:  Maryclare Labrador, RN 06/04/2015, 10:50 AM

## 2015-06-04 NOTE — Progress Notes (Signed)
2 Days Post-Op Procedure(s) (LRB): CORONARY ARTERY BYPASS GRAFTING (CABG) x  3 utilizing bilateral internal mammary artery and endoscopically harvested right sapheneous vein. (N/A) TRANSESOPHAGEAL ECHOCARDIOGRAM (TEE) (N/A) Subjective:  No complaints  Objective: Vital signs in last 24 hours: Temp:  [96.9 F (36.1 C)-98.8 F (37.1 C)] 98.4 F (36.9 C) (03/09 0414) Pulse Rate:  [69-95] 84 (03/09 0926) Cardiac Rhythm:  [-] Normal sinus rhythm (03/09 0855) Resp:  [10-33] 19 (03/09 0700) BP: (102-142)/(51-75) 120/65 mmHg (03/09 0926) SpO2:  [90 %-100 %] 93 % (03/09 0700) Arterial Line BP: (76)/(68) 76/68 mmHg (03/08 1000) Weight:  [88.8 kg (195 lb 12.3 oz)] 88.8 kg (195 lb 12.3 oz) (03/09 0630)  Hemodynamic parameters for last 24 hours: PAP: (39)/(21) 39/21 mmHg  Intake/Output from previous day: 03/08 0701 - 03/09 0700 In: 1886.3 [P.O.:1065; I.V.:521.3; IV Piggyback:300] Out: 1995 [Urine:1945; Chest Tube:50] Intake/Output this shift:    General appearance: alert and cooperative Neurologic: intact Heart: regular rate and rhythm, S1, S2 normal, no murmur, click, rub or gallop Lungs: diminished breath sounds bibasilar Extremities: edema mild Wound: incisions ok  Lab Results:  Recent Labs  06/03/15 1748 06/03/15 1755 06/04/15 0348  WBC 13.5*  --  10.7*  HGB 11.2* 10.9* 10.0*  HCT 32.3* 32.0* 28.6*  PLT 150  --  125*   BMET:  Recent Labs  06/03/15 0415  06/03/15 1755 06/04/15 0348  NA 140  --  138 135  K 3.7  --  3.9 3.6  CL 109  --  99* 102  CO2 24  --   --  25  GLUCOSE 114*  --  125* 113*  BUN 9  --  16 12  CREATININE 0.78  < > 0.90 0.85  CALCIUM 7.5*  --   --  7.8*  < > = values in this interval not displayed.  PT/INR:  Recent Labs  06/02/15 1343  LABPROT 15.7*  INR 1.23   ABG    Component Value Date/Time   PHART 7.325* 06/02/2015 1820   HCO3 23.8 06/02/2015 1820   TCO2 25 06/03/2015 1755   ACIDBASEDEF 2.0 06/02/2015 1820   O2SAT 89.0 06/02/2015  1820   CBG (last 3)   Recent Labs  06/03/15 2356 06/04/15 0351 06/04/15 0817  GLUCAP 107* 100* 108*   CXR: bibasilar atelectasis  Assessment/Plan: S/P Procedure(s) (LRB): CORONARY ARTERY BYPASS GRAFTING (CABG) x  3 utilizing bilateral internal mammary artery and endoscopically harvested right sapheneous vein. (N/A) TRANSESOPHAGEAL ECHOCARDIOGRAM (TEE) (N/A)  He is hemodynamically stable in sinus rhythm DC foley and sheath from neck Mobilize Diuresis Plan for transfer to step-down: see transfer orders   LOS: 6 days    [name] 06/04/2015

## 2015-06-05 LAB — GLUCOSE, CAPILLARY
GLUCOSE-CAPILLARY: 93 mg/dL (ref 65–99)
GLUCOSE-CAPILLARY: 80 mg/dL (ref 65–99)
GLUCOSE-CAPILLARY: 92 mg/dL (ref 65–99)
Glucose-Capillary: 98 mg/dL (ref 65–99)
Glucose-Capillary: 91 mg/dL (ref 65–99)

## 2015-06-05 LAB — BASIC METABOLIC PANEL
Anion gap: 8 (ref 5–15)
BUN: 9 mg/dL (ref 6–20)
CALCIUM: 8.2 mg/dL — AB (ref 8.9–10.3)
CO2: 27 mmol/L (ref 22–32)
CREATININE: 0.85 mg/dL (ref 0.61–1.24)
Chloride: 103 mmol/L (ref 101–111)
GFR calc Af Amer: >60 mL/min (ref >60)
GFR calc non Af Amer: >60 mL/min (ref >60)
GLUCOSE: 120 mg/dL — AB (ref 65–99)
Potassium: 3.4 mmol/L — ABNORMAL LOW (ref 3.5–5.1)
Sodium: 138 mmol/L (ref 135–145)

## 2015-06-05 MED ORDER — SODIUM CHLORIDE 0.9% FLUSH: 3.0000 mL | INTRAVENOUS | Status: DC | PRN

## 2015-06-05 MED ORDER — BISACODYL 5 MG PO TBEC
10.0000 mg | DELAYED_RELEASE_TABLET | Freq: Every day | ORAL | Status: DC | PRN
  Administered 2015-06-05: 10 mg via ORAL
  Filled 2015-06-05: qty 2

## 2015-06-05 MED ORDER — ASPIRIN EC 325 MG PO TBEC
325.0000 mg | DELAYED_RELEASE_TABLET | Freq: Every day | ORAL | Status: DC
  Administered 2015-06-05 – 2015-06-06 (×2): 325 mg via ORAL
  Filled 2015-06-05 (×2): qty 1

## 2015-06-05 MED ORDER — POTASSIUM CHLORIDE CRYS ER 20 MEQ PO TBCR
20.0000 meq | EXTENDED_RELEASE_TABLET | Freq: Two times a day (BID) | ORAL | Status: DC
  Administered 2015-06-05 – 2015-06-06 (×3): 20 meq via ORAL
  Filled 2015-06-05 (×3): qty 1

## 2015-06-05 MED ORDER — FUROSEMIDE 40 MG PO TABS
40.0000 mg | ORAL_TABLET | Freq: Every day | ORAL | Status: DC
  Administered 2015-06-05 – 2015-06-06 (×2): 40 mg via ORAL
  Filled 2015-06-05 (×2): qty 1

## 2015-06-05 MED ORDER — ONDANSETRON HCL 4 MG/2ML IJ SOLN: 4.0000 mg | Freq: Four times a day (QID) | INTRAMUSCULAR | Status: DC | PRN

## 2015-06-05 MED ORDER — BISACODYL 10 MG RE SUPP: 10.0000 mg | Freq: Every day | RECTAL | Status: DC | PRN

## 2015-06-05 MED ORDER — SODIUM CHLORIDE 0.9% FLUSH
3.0000 mL | Freq: Two times a day (BID) | INTRAVENOUS | Status: DC
  Administered 2015-06-05 – 2015-06-06 (×3): 3 mL via INTRAVENOUS

## 2015-06-05 MED ORDER — DOCUSATE SODIUM 100 MG PO CAPS
200.0000 mg | ORAL_CAPSULE | Freq: Every day | ORAL | Status: DC
  Administered 2015-06-05 – 2015-06-06 (×2): 200 mg via ORAL
  Filled 2015-06-05 (×2): qty 2

## 2015-06-05 MED ORDER — INSULIN ASPART 100 UNIT/ML ~~LOC~~ SOLN: 0.0000 [IU] | Freq: Three times a day (TID) | SUBCUTANEOUS | Status: DC

## 2015-06-05 MED ORDER — METOPROLOL TARTRATE 25 MG PO TABS
25.0000 mg | ORAL_TABLET | Freq: Two times a day (BID) | ORAL | Status: DC
  Administered 2015-06-05 – 2015-06-06 (×3): 25 mg via ORAL
  Filled 2015-06-05 (×3): qty 1

## 2015-06-05 MED ORDER — OXYCODONE HCL 5 MG PO TABS
5.0000 mg | ORAL_TABLET | ORAL | Status: DC | PRN
  Administered 2015-06-05: 10 mg via ORAL
  Administered 2015-06-05: 5 mg via ORAL
  Administered 2015-06-06 (×3): 10 mg via ORAL
  Filled 2015-06-05: qty 2
  Filled 2015-06-05: qty 1
  Filled 2015-06-05 (×3): qty 2

## 2015-06-05 MED ORDER — LISINOPRIL 5 MG PO TABS
5.0000 mg | ORAL_TABLET | Freq: Every day | ORAL | Status: DC
  Administered 2015-06-05 – 2015-06-06 (×2): 5 mg via ORAL
  Filled 2015-06-05 (×2): qty 1

## 2015-06-05 MED ORDER — SODIUM CHLORIDE 0.9 % IV SOLN: 250.0000 mL | INTRAVENOUS | Status: DC | PRN

## 2015-06-05 MED ORDER — TRAMADOL HCL 50 MG PO TABS
50.0000 mg | ORAL_TABLET | ORAL | Status: DC | PRN
  Administered 2015-06-05 – 2015-06-06 (×2): 100 mg via ORAL
  Filled 2015-06-05 (×3): qty 2

## 2015-06-05 MED ORDER — MOVING RIGHT ALONG BOOK
Freq: Once | Status: AC
  Administered 2015-06-05: 10:00:00
  Filled 2015-06-05: qty 1

## 2015-06-05 MED ORDER — ONDANSETRON HCL 4 MG PO TABS: 4.0000 mg | ORAL_TABLET | Freq: Four times a day (QID) | ORAL | Status: DC | PRN

## 2015-06-05 NOTE — Progress Notes (Signed)
1310-1340 Pt has walked three times already and has been resting about 20 minutes. Encouraged pt to rest 2 to 3 hours between walks. Education completed with pt and wife who voiced understanding. Encouraged IS. Gave fake cigarette and smoking cessation handout. Pt is going to use nicotine patches. Discussed CRP 2 and will refer to [place]. Wrote down how to view preop and discharge videos. Pt's wife wanted to watch pre op video. Gave heart healthy diet and ex ed. Graylon Good RN BSN 06/05/2015 1:40 PM

## 2015-06-05 NOTE — Discharge Summary (Signed)
Physician Discharge Summary  Patient ID: KYAW ZARN MRN: [id number] DOB/AGE: 03-31-1970 45 y.o.  Admit date: 05/29/2015 Discharge date: 06/06/2015  Admission Diagnoses: Unstable angina  Discharge Diagnoses:  Principal Problem:   NSTEMI (non-ST elevated myocardial infarction) [facility]) Active Problems:   Unstable angina pectoris ([place])   CAD (coronary artery disease), native coronary artery   S/P CABG x 3   Patient Active Problem List   Diagnosis Date Noted  . 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  . CAD (coronary artery disease), native coronary artery 06/01/2015  . Unstable angina pectoris ([place]) 05/29/2015  . NSTEMI (non-ST elevated myocardial infarction) ([place])   . Gastroesophageal reflux disease without esophagitis 02/25/2015  . Family history of premature CAD   . Erectile dysfunction   . Essential hypertension   . Smoker 07/02/2012  . Combined hyperlipidemia    HISTORY OF PRESENT ILLNESS: At time of admission This 45 year old male is seen for evaluation of progressive chest discomfort. The patient has a history of tobacco abuse, a very strongly positive family history for early coronary artery disease, hypertension, and hyperlipidemia. He was seen by me in November of 2015 and had a negative treadmill test. Over the past month he has developed progressive anterior substernal tightness. The discomfort is described as a pressure and heaviness will radiate to the inner aspect of both arms. It has been present with exertion but has become increasingly frequent and had a lesser level of activity. He had a prolonged episode occurring at rest yesterday afternoon as well as some chest discomfort last evening. He called the office and did not want to go to the emergency room yesterday and was seen this morning and because of the progressive nature of symptoms is brought into the hospital for treatment and evaluation of unstable angina. Prior to month ago he was not having much in the way of symptoms. He has  not eaten anything since last night. He denies PND, orthopnea or edema.  The patient was admitted to the hospital. He was started on intravenous heparin. He was scheduled for further evaluation and management including cardiac catheterization.  Discharged Condition: good  Hospital Course: The patient was admitted to the cardiology service under the care of Dr. Wynonia Lawman. He was felt to require cardiac catheterization and this was performed on 05/29/2015. Findings included the following:  nclusion    1. Severe mid LAD disease (75%), FFR 0.69. 2. Prox RCA to Dist RCA lesion, 99% stenosed. Chronic total occlusion of the distal RCA with left to right collaterals. 3. Culprit lesion is occluded first diagonal. THis was treated successfully with a 2.0 x 12 balloon. There is a 25% residual stenosis in the treated area. THere is an untreated ostial lesion in this vessel as well. 4. Prox Cx lesion, 50% stenosed. 5. Ramus lesion, 75% stenosed. This is a small vessel. 6. The left ventricular systolic function is normal   Due to these findings cardiothoracic surgical consultation was obtained with Gilford Raid M.D. who evaluated the patient and his studies and agreed with recommendations to proceed with coronary artery surgical revascularization. He was managed medically and on 06/02/2015 he was taken to the operating room where he underwent the below described procedure. He tolerated it well was taken to the surgical intensive care unit in stable condition. Postoperatively the patient has progressed well. He is remained hemodynamically stable in sinus rhythm. He was weaned from the ventilator without difficulty using standard protocols. All routine lines, monitors and drainage devices have been discontinued in standard  fashion. He does have an expected acute blood loss anemia and his pain monitored closely. He is not felt to require transfusion at this time. He has some postoperative volume overload and is getting  a gentle diuresis. Incisions are noted be healing well without evidence of infection. He is tolerating diet. He is tolerating gradually increasing activities using standard cardiac rehabilitation protocols. Blood sugars are under good control. Currently his status felt to be tentatively stable for discharge in the next approximately 48 hours pending ongoing reevaluation of his condition.  Consults: None  Significant Diagnostic Studies: Routine postoperative chest x-ray and laboratory studies  Treatments: surgery:   06/02/2015  Surgeon: [name], MD  First Assistant: Jadene Pierini, PA-C   Preoperative Diagnosis: Severe multi-vessel coronary artery disease   Postoperative Diagnosis: Same   Procedure:  7. Median Sternotomy 8. Extracorporeal circulation 3. Coronary artery bypass grafting x 3   Left internal mammary graft to the LAD  Free right internal mammary graft to the diagnonal  SVG to PDA   4. Endoscopic vein harvest from the right leg   Anesthesia: General Endotracheal   Discharge Exam: Blood pressure 134/78, pulse 76, temperature 98.6 F (37 C), temperature source Oral, resp. rate 18, height 5\' 9"  (1.753 m), weight 182 lb 1.6 oz (82.6 kg), SpO2 94 %.  General appearance: alert, cooperative and no distress Heart: regular rate and rhythm Lungs: clear to auscultation bilaterally Abdomen: benign Extremities: no edema Wound: incis healing well  Disposition: 06-Home-Health Care Svc      Discharge Instructions    Amb Referral to Cardiac Rehabilitation    Complete by:  As directed   Diagnosis:  CABG            Medication List    STOP taking these medications        acetaminophen 500 MG tablet  Commonly known as:  TYLENOL     lisinopril-hydrochlorothiazide 20-25 MG tablet  Commonly known as:  PRINZIDE,ZESTORETIC     MUCINEX DM MAXIMUM STRENGTH 60-1200 MG Tb12     sildenafil 50 MG tablet  Commonly known as:  VIAGRA      TAKE  these medications        albuterol 108 (90 Base) MCG/ACT inhaler  Commonly known as:  PROVENTIL HFA;VENTOLIN HFA  Inhale 2 puffs into the lungs every 6 (six) hours as needed for wheezing or shortness of breath.     aspirin 325 MG EC tablet  Take 1 tablet (325 mg total) by mouth daily.     lisinopril 10 MG tablet  Commonly known as:  PRINIVIL,ZESTRIL  Take 1 tablet (10 mg total) by mouth daily.     niacin 500 MG CR capsule  Take 1 capsule (500 mg total) by mouth at bedtime.     nicotine 21 mg/24hr patch  Commonly known as:  NICODERM CQ - dosed in mg/24 hours  Place 1 patch (21 mg total) onto the skin daily.     oxyCODONE 5 MG immediate release tablet  Commonly known as:  Oxy IR/ROXICODONE  Take 1-2 tablets (5-10 mg total) by mouth every 4 (four) hours as needed for severe pain.     pantoprazole 40 MG tablet  Commonly known as:  PROTONIX  1 tablet daily po 45 min prior to breakfast     rosuvastatin 10 MG tablet  Commonly known as:  CRESTOR  Take 1 tablet (10 mg total) by mouth daily.       Follow-up Information    Follow  up with [name], MD.   Specialty:  Cardiothoracic Surgery   Why:  07/01/2015 at 11 AM to see the surgeon. Please obtain a chest x-ray [facility] at 10:30 AM. [facility] imaging is located in the same office complex.   Contact information:   [address] [place]  [place] [address] [phone]       Follow up with Ezzard Standing, MD.   Specialty:  Cardiology   Why:  Please contact office to arrange for a two-week cardiologist appointment   Contact information:   [place] [place] [address] [phone]       Follow up with [facility].   Specialty:  Cardiothoracic Surgery   Why:  Nurse appointment for suture removal 06/12/2015 at 10:15 AM.   Contact information:   [place] [phone]    The  patient has been discharged on:   1.Beta Blocker:  Yes [  y ]                              No   [   ]                              If No, reason:  2.Ace Inhibitor/ARB: Yes Blue.Reese   ]                                     No  [    ]                                     If No, reason:  3.Statin:   Yes Blue.Reese   ]                  No  [   ]                  If No, reason:  4.Ecasa:  Yes  [ y  ]                  No   [   ]                  If No, reason:   Signed: Lenice Koper E 06/06/2015, 7:56 AM

## 2015-06-05 NOTE — Progress Notes (Signed)
[address]       [place] [address]             [phone]      3 Days Post-Op Procedure(s) (LRB): CORONARY ARTERY BYPASS GRAFTING (CABG) x  3 utilizing bilateral internal mammary artery and endoscopically harvested right sapheneous vein. (N/A) TRANSESOPHAGEAL ECHOCARDIOGRAM (TEE) (N/A) Subjective: Feels pretty well  Objective: Vital signs in last 24 hours: Temp:  [97.9 F (36.6 C)-98.8 F (37.1 C)] 98.3 F (36.8 C) (03/10 0450) Pulse Rate:  [72-86] 73 (03/10 0450) Cardiac Rhythm:  [-] Normal sinus rhythm (03/09 1900) Resp:  [18-25] 18 (03/10 0450) BP: (107-130)/(62-76) 121/70 mmHg (03/10 0450) SpO2:  [92 %-99 %] 95 % (03/10 0450) Weight:  [189 lb 3.2 oz (85.821 kg)] 189 lb 3.2 oz (85.821 kg) (03/10 [id number])  Hemodynamic parameters for last 24 hours:    Intake/Output from previous day: 03/09 0701 - 03/10 0700 In: 485.5 [P.O.:350; I.V.:35.5; IV Piggyback:100] Out: 1485 [Urine:1485] Intake/Output this shift:    General appearance: alert, cooperative and no distress Heart: regular rate and rhythm and + rub Lungs: min dim in left base Abdomen: benign Extremities: no edema Wound: incis healing well  Lab Results:  Recent Labs  06/03/15 1748 06/03/15 1755 06/04/15 0348  WBC 13.5*  --  10.7*  HGB 11.2* 10.9* 10.0*  HCT 32.3* 32.0* 28.6*  PLT 150  --  125*   BMET:  Recent Labs  06/03/15 0415  06/03/15 1755 06/04/15 0348  NA 140  --  138 135  K 3.7  --  3.9 3.6  CL 109  --  99* 102  CO2 24  --   --  25  GLUCOSE 114*  --  125* 113*  BUN 9  --  16 12  CREATININE 0.78  < > 0.90 0.85  CALCIUM 7.5*  --   --  7.8*  < > = values in this interval not displayed.  PT/INR:  Recent Labs  06/02/15 1343  LABPROT 15.7*  INR 1.23   ABG    Component Value Date/Time   PHART 7.325* 06/02/2015 1820   HCO3 23.8 06/02/2015 1820   TCO2 25 06/03/2015 1755   ACIDBASEDEF 2.0 06/02/2015 1820   O2SAT 89.0 06/02/2015 1820   CBG (last 3)    Recent Labs  06/04/15 2359 06/05/15 0442 06/05/15 0808  GLUCAP 97 93 80    Meds Scheduled Meds: . antiseptic oral rinse  7 mL Mouth Rinse BID  . antiseptic oral rinse  7 mL Mouth Rinse QID  . aspirin EC  325 mg Oral Daily  . docusate sodium  200 mg Oral Daily  . enoxaparin (LOVENOX) injection  40 mg Subcutaneous QHS  . famotidine  20 mg Oral BID  . furosemide  40 mg Oral Daily  . insulin aspart  0-24 Units Subcutaneous TID AC & HS  . metoprolol tartrate  25 mg Oral BID  . moving right along book   Does not apply Once  . nicotine  21 mg Transdermal Daily  . potassium chloride  20 mEq Oral BID  . rosuvastatin  10 mg Oral q1800  . sodium chloride flush  3 mL Intravenous Q12H   Continuous Infusions:  PRN Meds:.sodium chloride, ALPRAZolam, bisacodyl **OR** bisacodyl, calcium carbonate, ketorolac, ondansetron **OR** ondansetron (ZOFRAN) IV, oxyCODONE, sodium chloride flush, traMADol  Xrays Dg Chest Port 1 View  06/04/2015  CLINICAL DATA:  Status post CABG on June 02, 2015 EXAM: PORTABLE CHEST 1  VIEW COMPARISON:  Portable chest x-rays of March 7th and June 03, 2015. FINDINGS: The lungs are mildly hypoinflated. There small bilateral pleural effusions blunting the costophrenic angles. There is mild bibasilar atelectasis. The heart is mildly enlarged. The pulmonary vascularity is mildly prominent centrally. There is no pneumothorax. There has been interval extubation of the trachea and esophagus, removal of the chest tubes, and removal of the Swan-Ganz catheter. The internal jugular Cordis sheath on the right has its tip projecting over the proximal SVC. IMPRESSION: No significant pulmonary edema. Mild hypoinflation with persistent bibasilar atelectasis. Small bilateral pleural effusions. Electronically Signed   By: David  Martinique M.D.   On: 06/04/2015 08:01    Assessment/Plan: S/P Procedure(s) (LRB): CORONARY ARTERY BYPASS GRAFTING (CABG) x  3 utilizing bilateral internal mammary artery  and endoscopically harvested right sapheneous vein. (N/A) TRANSESOPHAGEAL ECHOCARDIOGRAM (TEE) (N/A)  1 doing well 2 hemodyn stable in sinus rhythm- can prob tol low dose ace inhib 3 has a rub- monitor clinically 4 H/H down further - monitor 5 no fevers or leukocytosis 6 ATX- push pulm toilet and rehab 7 sugars well controlled 8 cont diuresis for now , mild volume overload/wt up about 5 lbs  LOS: 7 days    Donald Oliver E 06/05/2015

## 2015-06-05 NOTE — Discharge Instructions (Signed)
Endoscopic Saphenous Vein Harvesting, Care After °Refer to this sheet in the next few weeks. These instructions provide you with information on caring for yourself after your procedure. Your health care provider may also give you more specific instructions. Your treatment has been planned according to current medical practices, but problems sometimes occur. Call your health care provider if you have any problems or questions after your procedure. °HOME CARE INSTRUCTIONS °Medicine °· Take whatever pain medicine your surgeon prescribes. Follow the directions carefully. Do not take over-the-counter pain medicine unless your surgeon says it is okay. Some pain medicine can cause bleeding problems for several weeks after surgery. °· Follow your surgeon's instructions about driving. You will probably not be permitted to drive after heart surgery. °· Take any medicines your surgeon prescribes. Any medicines you took before your heart surgery should be checked with your health care provider before you start taking them again. °Wound care °· If your surgeon has prescribed an elastic bandage or stocking, ask how long you should wear it. °· Check the area around your surgical cuts (incisions) whenever your bandages (dressings) are changed. Look for any redness or swelling. °· You will need to return to have the stitches (sutures) or staples taken out. Ask your surgeon when to do that. °· Ask your surgeon when you can shower or bathe. °Activity °· Try to keep your legs raised when you are sitting. °· Do any exercises your health care providers have given you. These may include deep breathing exercises, coughing, walking, or other exercises. °SEEK MEDICAL CARE IF: °· You have any questions about your medicines. °· You have more leg pain, especially if your pain medicine stops working. °· New or growing bruises develop on your leg. °· Your leg swells, feels tight, or becomes red. °· You have numbness in your leg. °SEEK IMMEDIATE  MEDICAL CARE IF: °· Your pain gets much worse. °· Blood or fluid leaks from any of the incisions. °· Your incisions become warm, swollen, or red. °· You have chest pain. °· You have trouble breathing. °· You have a fever. °· You have more pain near your leg incision. °MAKE SURE YOU: °· Understand these instructions. °· Will watch your condition. °· Will get help right away if you are not doing well or get worse. °  °This information is not intended to replace advice given to you by your health care provider. Make sure you discuss any questions you have with your health care provider. °  °Document Released: 11/24/2010 Document Revised: 04/04/2014 Document Reviewed: 11/24/2010 °Elsevier Interactive Patient Education ©2016 [employer] °Coronary Artery Bypass Grafting, Care After °These instructions give you information on caring for yourself after your procedure. Your doctor may also give you more specific instructions. Call your doctor if you have any problems or questions after your procedure.  °HOME CARE °· Only take medicine as told by your doctor. Take medicines exactly as told. Do not stop taking medicines or start any new medicines without talking to your doctor first. °· Take your pulse as told by your doctor. °· Do deep breathing as told by your doctor. Use your breathing device (incentive spirometer), if given, to practice deep breathing several times a day. Support your chest with a pillow or your arms when you take deep breaths or cough. °· Keep the area clean, dry, and protected where the surgery cuts (incisions) were made. Remove bandages (dressings) only as told by your doctor. If strips were applied to surgical area, do not take   them off. They fall off on their own. °· Check the surgery area daily for puffiness (swelling), redness, or leaking fluid. °· If surgery cuts were made in your legs: °· Avoid crossing your legs. °· Avoid sitting for long periods of time. Change positions every 30  minutes. °· Raise your legs when you are sitting. Place them on pillows. °· Wear stockings that help keep blood clots from forming in your legs (compression stockings). °· Only take sponge baths until your doctor says it is okay to take showers. Pat the surgery area dry. Do not rub the surgery area with a washcloth or towel. Do not bathe, swim, or use a hot tub until your doctor says it is okay. °· Eat foods that are high in fiber. These include raw fruits and vegetables, whole grains, beans, and nuts. Choose lean meats. Avoid canned, processed, and fried foods. °· Drink enough fluids to keep your pee (urine) clear or pale yellow. °· Weigh yourself every day. °· Rest and limit activity as told by your doctor. You may be told to: °· Stop any activity if you have chest pain, shortness of breath, changes in heartbeat, or dizziness. Get help right away if this happens. °· Move around often for short amounts of time or take short walks as told by your doctor. Gradually become more active. You may need help to strengthen your muscles and build endurance. °· Avoid lifting, pushing, or pulling anything heavier than 10 pounds (4.5 kg) for at least 6 weeks after surgery. °· Do not drive until your doctor says it is okay. °· Ask your doctor when you can go back to work. °· Ask your doctor when you can begin sexual activity again. °· Follow up with your doctor as told. °GET HELP IF: °· You have puffiness, redness, more pain, or fluid draining from the incision site. °· You have a fever. °· You have puffiness in your ankles or legs. °· You have pain in your legs. °· You gain 2 or more pounds (0.9 kg) a day. °· You feel sick to your stomach (nauseous) or throw up (vomit). °· You have watery poop (diarrhea). °GET HELP RIGHT AWAY IF: °· You have chest pain that goes to your jaw or arms. °· You have shortness of breath. °· You have a fast or irregular heartbeat. °· You notice a "clicking" in your breastbone when you move. °· You  have numbness or weakness in your arms or legs. °· You feel dizzy or light-headed. °MAKE SURE YOU: °· Understand these instructions. °· Will watch your condition. °· Will get help right away if you are not doing well or get worse. °  °This information is not intended to replace advice given to you by your health care provider. Make sure you discuss any questions you have with your health care provider. °  °Document Released: 03/19/2013 Document Reviewed: 03/19/2013 °Elsevier Interactive Patient Education ©2016 Elsevier Inc. ° °

## 2015-06-06 LAB — GLUCOSE, CAPILLARY
GLUCOSE-CAPILLARY: 96 mg/dL (ref 65–99)
Glucose-Capillary: 88 mg/dL (ref 65–99)

## 2015-06-06 MED ORDER — LISINOPRIL 10 MG PO TABS: 10.0000 mg | ORAL_TABLET | Freq: Every day | ORAL | Status: DC

## 2015-06-06 MED ORDER — OXYCODONE HCL 5 MG PO TABS: 5.0000 mg | ORAL_TABLET | ORAL | Status: DC | PRN

## 2015-06-06 MED ORDER — ASPIRIN 325 MG PO TBEC: 325.0000 mg | DELAYED_RELEASE_TABLET | Freq: Every day | ORAL | Status: DC

## 2015-06-06 NOTE — Progress Notes (Signed)
Pt education fro removal of pacing wires was given. 4 Pacing wires taken out as ordered. Well tolerated by pt.  will continue to monitor

## 2015-06-06 NOTE — Progress Notes (Signed)
Went over discharge instructions with pt,pt verbalised understanding paper prescriptions given to pt, pt belongings at bedside, iv taken out, cardiac monitor dc, ccmd notified

## 2015-06-06 NOTE — Progress Notes (Addendum)
[address]       [place] [address]             [phone]      4 Days Post-Op Procedure(s) (LRB): CORONARY ARTERY BYPASS GRAFTING (CABG) x  3 utilizing bilateral internal mammary artery and endoscopically harvested right sapheneous vein. (N/A) TRANSESOPHAGEAL ECHOCARDIOGRAM (TEE) (N/A) Subjective: Feels well , no specific c/o  Objective: Vital signs in last 24 hours: Temp:  [97.6 F (36.4 C)-99.1 F (37.3 C)] 98.6 F (37 C) (03/11 0542) Pulse Rate:  [76-88] 76 (03/11 0542) Cardiac Rhythm:  [-] Normal sinus rhythm (03/10 1900) Resp:  [18] 18 (03/11 0542) BP: (121-134)/(66-78) 134/78 mmHg (03/11 0542) SpO2:  [91 %-95 %] 94 % (03/11 0542) Weight:  [182 lb 1.6 oz (82.6 kg)] 182 lb 1.6 oz (82.6 kg) (03/11 0542)  Hemodynamic parameters for last 24 hours:    Intake/Output from previous day: 03/10 0701 - 03/11 0700 In: 720 [P.O.:720] Out: 2575 [Urine:2575] Intake/Output this shift:    General appearance: alert, cooperative and no distress Heart: regular rate and rhythm Lungs: clear to auscultation bilaterally Abdomen: benign Extremities: no edema Wound: incis healing well  Lab Results:  Recent Labs  06/03/15 1748 06/03/15 1755 06/04/15 0348  WBC 13.5*  --  10.7*  HGB 11.2* 10.9* 10.0*  HCT 32.3* 32.0* 28.6*  PLT 150  --  125*   BMET:  Recent Labs  06/04/15 0348 06/05/15 0934  NA 135 138  K 3.6 3.4*  CL 102 103  CO2 25 27  GLUCOSE 113* 120*  BUN 12 9  CREATININE 0.85 0.85  CALCIUM 7.8* 8.2*    PT/INR: No results for input(s): LABPROT, INR in the last 72 hours. ABG    Component Value Date/Time   PHART 7.325* 06/02/2015 1820   HCO3 23.8 06/02/2015 1820   TCO2 25 06/03/2015 1755   ACIDBASEDEF 2.0 06/02/2015 1820   O2SAT 89.0 06/02/2015 1820   CBG (last 3)   Recent Labs  06/05/15 1601 06/05/15 2028 06/06/15 0619  GLUCAP 91 92 96    Meds Scheduled Meds: . antiseptic oral rinse  7 mL Mouth Rinse BID  . antiseptic  oral rinse  7 mL Mouth Rinse QID  . aspirin EC  325 mg Oral Daily  . docusate sodium  200 mg Oral Daily  . enoxaparin (LOVENOX) injection  40 mg Subcutaneous QHS  . famotidine  20 mg Oral BID  . furosemide  40 mg Oral Daily  . insulin aspart  0-24 Units Subcutaneous TID AC & HS  . lisinopril  5 mg Oral Daily  . metoprolol tartrate  25 mg Oral BID  . nicotine  21 mg Transdermal Daily  . potassium chloride  20 mEq Oral BID  . rosuvastatin  10 mg Oral q1800  . sodium chloride flush  3 mL Intravenous Q12H   Continuous Infusions:  PRN Meds:.sodium chloride, ALPRAZolam, bisacodyl **OR** bisacodyl, calcium carbonate, ketorolac, ondansetron **OR** ondansetron (ZOFRAN) IV, oxyCODONE, sodium chloride flush, traMADol  Xrays No results found.  Assessment/Plan: S/P Procedure(s) (LRB): CORONARY ARTERY BYPASS GRAFTING (CABG) x  3 utilizing bilateral internal mammary artery and endoscopically harvested right sapheneous vein. (N/A) TRANSESOPHAGEAL ECHOCARDIOGRAM (TEE) (N/A) d/c pacing wires Plan for discharge: see discharge orders   LOS: 8 days    Oliver,Donald E 06/06/2015  Plan d/c today I have seen and examined Donald Oliver and agree with the above assessment  and plan.  Grace Isaac MD [facility] [phone]  Office [phone] 06/06/2015 4:14 PM

## 2015-06-06 NOTE — Progress Notes (Signed)
CARDIAC REHAB PHASE I   PRE:  Rate/Rhythm: SR 76  BP:  Supine:   Sitting: 137/84  Standing:    SaO2: 95 RA  MODE:  Ambulation: 700 ft   POST:  Rate/Rhythm: 84  BP:  Supine:   Sitting: 130/89  Standing:    SaO2: 97 RA  Answered questions patient had regarding the outpatient cardiac rehab program here at [facility] as far as when to anticipate contact and starting the rehab.  Pt ambulated x 1 minimal assist 700 feet.  Pt did not use walker due to plans for d/c later today.  Talked with pt regarding ambulate up stairs in his home.  Pt verbalized understanding.  Pt oob to chair, call bell within reach. Cherre Huger, BSN [phone]

## 2015-06-12 ENCOUNTER — Ambulatory Visit (INDEPENDENT_AMBULATORY_CARE_PROVIDER_SITE_OTHER): Payer: Self-pay

## 2015-06-12 DIAGNOSIS — Z4802 Encounter for removal of sutures: Secondary | ICD-10-CM

## 2015-06-12 DIAGNOSIS — G8918 Other acute postprocedural pain: Secondary | ICD-10-CM

## 2015-06-12 DIAGNOSIS — Z951 Presence of aortocoronary bypass graft: Secondary | ICD-10-CM

## 2015-06-12 MED ORDER — TRAMADOL HCL 50 MG PO TABS: 50.0000 mg | ORAL_TABLET | Freq: Four times a day (QID) | ORAL | Status: DC | PRN

## 2015-06-12 NOTE — Progress Notes (Signed)
Removed 4 sutures from chest tube site with no signs of infection and patient tolerated well. RX for Tramadol 50 mg was faxed to [facility].

## 2015-06-22 ENCOUNTER — Other Ambulatory Visit: Payer: Self-pay | Admitting: *Deleted

## 2015-06-22 DIAGNOSIS — G8918 Other acute postprocedural pain: Secondary | ICD-10-CM

## 2015-06-22 DIAGNOSIS — Z4802 Encounter for removal of sutures: Secondary | ICD-10-CM

## 2015-06-22 MED ORDER — TRAMADOL HCL 50 MG PO TABS: 50.0000 mg | ORAL_TABLET | Freq: Four times a day (QID) | ORAL | Status: DC | PRN

## 2015-06-22 NOTE — Telephone Encounter (Signed)
Mr. Donald Oliver has called requesting a refill for Tramadol s/p CABG 06/02/15. I informed him that I would fax a new signed script to his pharmacy today and he understood.

## 2015-06-24 ENCOUNTER — Encounter: Payer: Self-pay | Admitting: Cardiology

## 2015-06-24 NOTE — Progress Notes (Signed)
Patient ID: JAYRO CASTNER, male   DOB: [birth year], 45 y.o.   MRN: [id number]    Roark, Holik    Date of visit:  06/24/2015 DOB:  04/29/1970    Age:  45 yrs. Medical record number:  [id number]     Account number:  [id number] Primary Care Provider: Robyne Askew ____________________________ CURRENT DIAGNOSES  1. CAD Native without angina  2. Family history of ischemic heart disease and other diseases of the circulatory system  3. Essential (primary) hypertension  4. Overweight  5. Hyperlipidemia ____________________________ ALLERGIES  Amoxicillin, Swelling (non-specific) ____________________________ MEDICATIONS  1. lisinopril 20 mg-hydrochlorothiazide 25 mg tablet, 1 p.o. daily  2. Crestor 10 mg tablet, 1 p.o. daily  3. pantoprazole 40 mg tablet,delayed release, 1 p.o. daily  4. aspirin 81 mg chewable tablet, 4 p.o. daily  5. clopidogrel 75 mg tablet, 1 p.o. daily ____________________________ HISTORY OF PRESENT ILLNESS Patient seen for cardiac followup. He was previously seen in the morning in the office with prolonged episode of pain that occurred at rest and was sent to the hospital. Catheterization later on that afternoon showed a subtotal occlusion of the diagonal and this was opened with a 2.0 balloon with restoration of flow. He has severe disease in the LAD moderate in the circumflex and a total right coronary artery. After discussing options with him elected to proceed with bypass grafting which was done on the seventh by Dr. Cyndia Bent. He had a mammary to the LAD a free mammary to the diagonal and a vein graft to the right coronary artery. He had a recent we'll postoperative course and was discharged home after about 4 days. Since discharge she has had the usual amount of chest wall pain. He has not restarted smoking. He denies PND, orthopnea or edema. He is starting to exercise some at the present time. Only complaint of significance is difficulty sleeping.   ____________________________ PAST HISTORY  Past Medical Illnesses:  hypertension, GERD, erectile dysfunction, hyperlipidemia;  Cardiovascular Illnesses:  CAD;  Infectious Diseases:  no previous history of significant infectious diseases;  Surgical Procedures:  deviated septum repair;  Trauma History:  no previous history of significant trauma;  NYHA Classification:  I;  Canadian Angina Classification:  Class 0: Asymptomatic;  Cardiology Procedures-Invasive:  cardiac cath (left) 2017, CABG with LIMA to LAD, free RIMA to the diagonal and SVG to RCA  06/02/15 Dr. Cyndia Bent;  Cardiology Procedures-Noninvasive:  treadmill November 2015;  Cardiac Cath Results:  normal left main, ostial diagonal, subtotal mid diagonal, 80-90% mid LAD, occluded RCA with collateral;  LVEF of 60% documented via cardiac cath on 05/29/2015,   ____________________________ CARDIO-PULMONARY TEST DATES EKG Date:  06/24/2015;   Cardiac Cath Date:  05/29/2015;  CABG: 06/02/2015;   ____________________________ SOCIAL HISTORY Alcohol Use:  occasionally;  Smoking:  used to smoke but quit 2017;  Diet:  regular diet;  Lifestyle:  married, 1 daughter and 2 stepdaughters;  Exercise:  some exercise;  Occupation:  Oceanographer;  Residence:  lives with wife;   ____________________________ REVIEW OF SYSTEMS General:  dmalaise and fatigue  Eyes: denies diplopia, history of glaucoma Respiratory: denies dyspnea, cough, wheezing or hemoptysis. Cardiovascular:  please review HPI Abdominal: dyspepsia   Musculoskeletal:  arthritis of the hands  Psychiatric:  insomnia  ____________________________ PHYSICAL EXAMINATION VITAL SIGNS  Blood Pressure:  110/70 Sitting, Left arm, regular cuff  , 114/72 Standing, Left arm and regular cuff   Pulse:  84/min. Weight:  183.00 lbs. Height:  69"BMI: 27  Constitutional:  pleasant white male in no acute distress, overweight Skin:  warm and dry to touch, no apparent skin lesions, or masses  noted. Head:  normocephalic, normal hair pattern, no masses or tenderness Neck:  supple, without massess. No JVD, thyromegaly or carotid bruits. Carotid upstroke normal. Chest:  normal symmetry, clear to auscultation., healed median sternotomy scar Cardiac:  regular rhythm, normal S1 and S2, No S3 or S4, no murmurs, gallops or rubs detected. Peripheral Pulses:  the femoral,dorsalis pedis, and posterior tibial pulses are full and equal bilaterally with no bruits auscultated. Extremities & Back:  well healed saphenous vein donor site RLE Neurological:  no gross motor or sensory deficits noted, affect appropriate, oriented x3. ____________________________ MOST RECENT LIPID PANEL 02/12/15  CHOL TOTL 171 mg/dl, LDL 71 NM, HDL 38 mg/dl, TRIGLYCER 312 mg/dl and CHOL/HDL 4.5 (Calc) ____________________________ IMPRESSIONS/PLAN  1. Coronary artery disease with recent CABG x3 her 2. Prior PCI of the diagonal 3. Hyperlipidemia under treatment 4. Hypertension controlled 5. Overweight  Recommendations:  Add Plavix 75 mg to regimen. Continue other medicine. Followup in one month with an exercise treadmill test. Call if problems. EKG shows a small Q wave in the inferior leads otherwise unremarkable. He will get involved in cardiac rehabilitation and will follow up with Dr. Cyndia Bent next week. 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 ____________________________ Hermenia Fiscal ORDERS  1. 12 Lead EKG: Today  2. Return Visit: 1 month                       ____________________________ Cardiology Physician:  Kerry Hough MD [facility]

## 2015-06-30 ENCOUNTER — Other Ambulatory Visit: Payer: Self-pay | Admitting: Surgery

## 2015-06-30 DIAGNOSIS — Z951 Presence of aortocoronary bypass graft: Secondary | ICD-10-CM

## 2015-07-01 ENCOUNTER — Encounter: Payer: Self-pay | Admitting: Surgery

## 2015-07-01 ENCOUNTER — Ambulatory Visit
Admission: RE | Admit: 2015-07-01 | Discharge: 2015-07-01 | Disposition: A | Discharge status: Home / Self Care | Payer: BLUE CROSS/BLUE SHIELD | Source: Ambulatory Visit | Attending: Surgery | Admitting: Surgery

## 2015-07-01 ENCOUNTER — Ambulatory Visit (INDEPENDENT_AMBULATORY_CARE_PROVIDER_SITE_OTHER): Payer: Self-pay | Admitting: Surgery

## 2015-07-01 VITALS — BP 115/87 | HR 84 | Resp 16 | Ht 69.0 in | Wt 182.0 lb

## 2015-07-01 DIAGNOSIS — Z951 Presence of aortocoronary bypass graft: Secondary | ICD-10-CM

## 2015-07-01 NOTE — Progress Notes (Signed)
HPI: Patient returns for routine postoperative follow-up having undergone CABG x 3 using bilateral IMA grafts on 06/02/2014. His wife is with him. The patient's early postoperative recovery while in the hospital was notable for an uncomplicated postop course. Since hospital discharge the patient reports that he has been feeling well. He has the expected chest wall discomfort that is improving. He is walking without angina or shortness of breath. He has abstained from smoking.   Current Outpatient Prescriptions  Medication Sig Dispense Refill  . albuterol (PROVENTIL HFA;VENTOLIN HFA) 108 (90 Base) MCG/ACT inhaler Inhale 2 puffs into the lungs every 6 (six) hours as needed for wheezing or shortness of breath. 1 Inhaler 1  . aspirin 81 MG tablet Take 81 mg by mouth daily.    . clopidogrel (PLAVIX) 75 MG tablet Take 75 mg by mouth daily.    Marland Kitchen lisinopril (PRINIVIL,ZESTRIL) 10 MG tablet Take 1 tablet (10 mg total) by mouth daily. 30 tablet 1  . nicotine (NICODERM CQ - DOSED IN MG/24 HOURS) 21 mg/24hr patch Place 1 patch (21 mg total) onto the skin daily. 28 patch 0  . pantoprazole (PROTONIX) 40 MG tablet 1 tablet daily po 45 min prior to breakfast (Patient taking differently: Take 40 mg by mouth daily before breakfast. Take 45 min prior to breakfast) 90 tablet 0  . rosuvastatin (CRESTOR) 10 MG tablet Take 1 tablet (10 mg total) by mouth daily. 90 tablet 3  . niacin 500 MG CR capsule Take 1 capsule (500 mg total) by mouth at bedtime. (Patient not taking: Reported on 05/29/2015) 30 capsule 2  . oxyCODONE (OXY IR/ROXICODONE) 5 MG immediate release tablet Take 1-2 tablets (5-10 mg total) by mouth every 4 (four) hours as needed for severe pain. (Patient not taking: Reported on 07/01/2015) 50 tablet 0  . traMADol (ULTRAM) 50 MG tablet Take 1 tablet (50 mg total) by mouth every 6 (six) hours as needed. (Patient not taking: Reported on 07/01/2015) 40 tablet 0   No current facility-administered medications for  this visit.    Physical Exam: BP 115/87 mmHg  Pulse 84  Resp 16  Ht 5\' 9"  (1.753 m)  Wt 182 lb (82.555 kg)  BMI 26.86 kg/m2  SpO2 98% He looks well. Lung exam is clear. Cardiac exam shows a regular rate and rhythm with normal heart sounds. Chest incision is healing well and sternum is stable. The leg incisions are healing well and there is no peripheral edema.    Diagnostic Tests:  CLINICAL DATA: History of CABG and cardioversion, followup  EXAM: CHEST 2 VIEW  COMPARISON: Portable chest x-ray of 06/04/2015  FINDINGS: Aeration of the lung bases has improved significantly with improvement in basilar atelectasis. Mild linear atelectasis remains in the lingula and deep in the right lung base. No pleural effusion is seen. Mediastinal and hilar contours are unremarkable. The heart is within upper limits of normal. Median sternotomy sutures are noted from prior CABG.  IMPRESSION: Improved aeration with diminishing basilar linear atelectasis.   Electronically Signed  By: Ivar Drape M.D.  On: 07/01/2015 10:28        Impression: Overall I think he is doing well. I encouraged him to continue walking. He is planning to participate in cardiac rehab. I told him he could drive his car but should not lift anything heavier than 10 lbs for three months postop. I encouraged him to stay away from smoking. He seems motivated to do so. 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  Plan:  He will continue to follow up with Dr. Wynonia Lawman and will contact me if he develops any problems with his incisions.   [name], MD [facility] [phone]

## 2015-07-16 ENCOUNTER — Encounter (HOSPITAL_COMMUNITY)
Admission: RE | Admit: 2015-07-16 | Discharge: 2015-07-16 | Disposition: A | Discharge status: Home / Self Care | Payer: BLUE CROSS/BLUE SHIELD | Source: Ambulatory Visit | Attending: Cardiology | Admitting: Cardiology

## 2015-07-16 VITALS — BP 128/80 | HR 83 | Ht 68.0 in | Wt 187.4 lb

## 2015-07-16 DIAGNOSIS — F17210 Nicotine dependence, cigarettes, uncomplicated: Secondary | ICD-10-CM | POA: Diagnosis not present

## 2015-07-16 DIAGNOSIS — E782 Mixed hyperlipidemia: Secondary | ICD-10-CM | POA: Insufficient documentation

## 2015-07-16 DIAGNOSIS — Z7982 Long term (current) use of aspirin: Secondary | ICD-10-CM | POA: Insufficient documentation

## 2015-07-16 DIAGNOSIS — Z8249 Family history of ischemic heart disease and other diseases of the circulatory system: Secondary | ICD-10-CM | POA: Insufficient documentation

## 2015-07-16 DIAGNOSIS — I10 Essential (primary) hypertension: Secondary | ICD-10-CM | POA: Insufficient documentation

## 2015-07-16 DIAGNOSIS — Z951 Presence of aortocoronary bypass graft: Secondary | ICD-10-CM | POA: Insufficient documentation

## 2015-07-16 DIAGNOSIS — I2510 Atherosclerotic heart disease of native coronary artery without angina pectoris: Secondary | ICD-10-CM | POA: Insufficient documentation

## 2015-07-16 DIAGNOSIS — Z79899 Other long term (current) drug therapy: Secondary | ICD-10-CM | POA: Diagnosis not present

## 2015-07-16 DIAGNOSIS — K219 Gastro-esophageal reflux disease without esophagitis: Secondary | ICD-10-CM | POA: Diagnosis not present

## 2015-07-16 DIAGNOSIS — Z7902 Long term (current) use of antithrombotics/antiplatelets: Secondary | ICD-10-CM | POA: Diagnosis not present

## 2015-07-16 NOTE — Progress Notes (Signed)
Cardiac Rehab Medication Review by a Pharmacist  Does the patient  feel that his/her medications are working for him/her?  yes  Has the patient been experiencing any side effects to the medications prescribed?  no  Does the patient measure his/her own blood pressure or blood glucose at home?  no   Does the patient have any problems obtaining medications due to transportation or finances?   no  Understanding of regimen: good Understanding of indications: good Potential of compliance: good  Pharmacist comments: 45 y/o M presents to cardiac rehab without assitance and in good spirits. Endorses a good understanding of his treatment regimen. Provided counseling on the importance of checking blood pressure.   Myrene Galas, PharmD 07/16/2015 8:56 AM

## 2015-07-16 NOTE — Progress Notes (Signed)
Cardiac Individual Treatment Plan  Patient Details  Name: Donald Oliver MRN: [id number] Date of Birth: Mar 22, 1971 Referring Provider:        CARDIAC REHAB PHASE II ORIENTATION from 07/16/2015 in [place]   Referring Provider  Dr. Tollie Eth, MD      Initial Encounter Date:       CARDIAC REHAB PHASE II ORIENTATION from 07/16/2015 in [place]   Date  07/16/15   Referring Provider  Dr. Tollie Eth, MD      Visit Diagnosis: S/P CABG x 3  Patient's Home Medications on Admission:  Current outpatient prescriptions:  .  albuterol (PROVENTIL HFA;VENTOLIN HFA) 108 (90 Base) MCG/ACT inhaler, Inhale 2 puffs into the lungs every 6 (six) hours as needed for wheezing or shortness of breath., Disp: 1 Inhaler, Rfl: 1 .  aspirin 81 MG tablet, Take 81 mg by mouth daily., Disp: , Rfl:  .  clopidogrel (PLAVIX) 75 MG tablet, Take 75 mg by mouth daily., Disp: , Rfl:  .  lisinopril (PRINIVIL,ZESTRIL) 10 MG tablet, Take 1 tablet (10 mg total) by mouth daily., Disp: 30 tablet, Rfl: 1 .  Multiple Vitamin (MULTIVITAMIN WITH MINERALS) TABS tablet, Take 1 tablet by mouth daily., Disp: , Rfl:  .  nicotine polacrilex (COMMIT) 4 MG lozenge, Take 4 mg by mouth as needed for smoking cessation., Disp: , Rfl:  .  pantoprazole (PROTONIX) 40 MG tablet, 1 tablet daily po 45 min prior to breakfast (Patient taking differently: Take 40 mg by mouth daily before breakfast. Take 45 min prior to breakfast), Disp: 90 tablet, Rfl: 0 .  rosuvastatin (CRESTOR) 10 MG tablet, Take 1 tablet (10 mg total) by mouth daily., Disp: 90 tablet, Rfl: 3 .  traMADol (ULTRAM) 50 MG tablet, Take 1 tablet (50 mg total) by mouth every 6 (six) hours as needed., Disp: 40 tablet, Rfl: 0 .  niacin 500 MG CR capsule, Take 1 capsule (500 mg total) by mouth at bedtime. (Patient not taking: Reported on 05/29/2015), Disp: 30 capsule, Rfl: 2 .  nicotine (NICODERM CQ - DOSED IN MG/24 HOURS)  21 mg/24hr patch, Place 1 patch (21 mg total) onto the skin daily. (Patient not taking: Reported on 07/16/2015), Disp: 28 patch, Rfl: 0 .  oxyCODONE (OXY IR/ROXICODONE) 5 MG immediate release tablet, Take 1-2 tablets (5-10 mg total) by mouth every 4 (four) hours as needed for severe pain. (Patient not taking: Reported on 07/01/2015), Disp: 50 tablet, Rfl: 0  Past Medical History: Past Medical History  Diagnosis Date  . FH: premature coronary heart disease     Father had MI at age 93   . History of substance abuse     Pt had abused amphetamines : quit in 2005  . Hypertension 2005  . GERD (gastroesophageal reflux disease)   . Combined hyperlipidemia   . Impaired fasting blood sugar 2016  . Onychomycosis   . CAD (coronary artery disease), native coronary artery 06/01/2015    Cath 3/3 80% LAD, 99% diag, 50% circ, RCA 100% with Lto R collaterals  CABG 06/02/15 Dr. Cyndia Bent with LIMA to LAD, free RIMA to Diag and SVG to RCA      Tobacco Use: History  Smoking status  . Current Every Day Smoker -- 1.00 packs/day for 30 years  . Types: Cigarettes  Smokeless tobacco  . Never Used    Labs:     Recent Review Flowsheet Data    Labs for ITP Cardiac and  Pulmonary Rehab Latest Ref Rng 06/02/2015 06/02/2015 06/02/2015 06/02/2015 06/03/2015   PHART 7.350 - 7.450 7.333(L) 7.290(L) 7.325(L) - -   PCO2ART 35.0 - 45.0 mmHg 43.5 41.1 46.1(H) - -   HCO3 20.0 - 24.0 mEq/L 23.1 19.6(L) 23.8 - -   TCO2 0 - 100 mmol/L 24 21 25 23 25    ACIDBASEDEF 0.0 - 2.0 mmol/L 3.0(H) 6.0(H) 2.0 - -   O2SAT - 86.0 86.0 89.0 - -      Capillary Blood Glucose: Lab Results  Component Value Date   GLUCAP 88 06/06/2015   GLUCAP 96 06/06/2015   GLUCAP 92 06/05/2015   GLUCAP 91 06/05/2015   GLUCAP 98 06/05/2015     Exercise Target Goals: Date: 07/16/15  Exercise Program Goal: Individual exercise prescription set with THRR, safety & activity barriers. Participant demonstrates ability to understand and report RPE using BORG scale,  to self-measure pulse accurately, and to acknowledge the importance of the exercise prescription.  Exercise Prescription Goal: Starting with aerobic activity 30 plus minutes a day, 3 days per week for initial exercise prescription. Provide home exercise prescription and guidelines that participant acknowledges understanding prior to discharge.  Activity Barriers & Risk Stratification:     Activity Barriers & Cardiac Risk Stratification - 07/16/15 0819    Activity Barriers & Cardiac Risk Stratification   Activity Barriers None   Cardiac Risk Stratification High      6 Minute Walk:     6 Minute Walk      07/16/15 0946       6 Minute Walk   Phase Initial     Distance 1264 feet     Walk Time 6 minutes     # of Rest Breaks 0     MPH 2.4     METS 3.94     RPE 11     VO2 Peak 13.79     Symptoms No     Resting HR 83 bpm     Resting BP 128/80 mmHg     Max Ex. HR 86 bpm     Max Ex. BP 118/70 mmHg     2 Minute Post BP 102/70 mmHg        Initial Exercise Prescription:     Initial Exercise Prescription - 07/16/15 1100    Date of Initial Exercise RX and Referring Provider   Date 07/16/15   Referring Provider Dr. Tollie Eth, MD   Treadmill   MPH 2.4   Grade 0   Minutes 10   METs 2.8   Bike   Level 1   METs 2   NuStep   Level 3   Minutes 10   METs 2   Prescription Details   Frequency (times per week) 3   Duration Progress to 30 minutes of continuous aerobic without signs/symptoms of physical distress   Intensity   THRR 40-80% of Max Heartrate 70-140   Ratings of Perceived Exertion 11-13   Progression   Progression Continue to progress workloads to maintain intensity without signs/symptoms of physical distress.   Resistance Training   Training Prescription Yes   Weight 2lbs   Reps 10-12      Perform Capillary Blood Glucose checks as needed.  Exercise Prescription Changes:   Exercise Comments:   Discharge Exercise Prescription (Final Exercise  Prescription Changes):   Nutrition:  Target Goals: Understanding of nutrition guidelines, daily intake of sodium 1500mg , cholesterol 200mg , calories 30% from fat and 7% or less from saturated fats, daily to have  5 or more servings of fruits and vegetables.  Biometrics:     Pre Biometrics - 07/16/15 0952    Pre Biometrics   Waist Circumference 41.75 inches   Hip Circumference 41 inches   Waist to Hip Ratio 1.02 %   Triceps Skinfold 20 mm   % Body Fat 29.1 %   Grip Strength 40 kg   Flexibility 0 in   Single Leg Stand 30 seconds       Nutrition Therapy Plan and Nutrition Goals:     Nutrition Therapy & Goals - 07/16/15 1018    Nutrition Therapy   Diet Therapeutic Lifestyle Changes   Personal Nutrition Goals   Personal Goal #1 Wt loss of 0.5-2 lb to a goal wt loss of 6-15 lb at graduation from [place], educate and counsel regarding individualized specific dietary modifications aiming towards targeted core components such as weight, hypertension, lipid management, diabetes, heart failure and other comorbidities.   Expected Outcomes Short Term Goal: Understand basic principles of dietary content, such as calories, fat, sodium, cholesterol and nutrients.;Long Term Goal: Adherence to prescribed nutrition plan.      Nutrition Discharge: Nutrition Scores:   Nutrition Goals Re-Evaluation:   Psychosocial: Target Goals: Acknowledge presence or absence of depression, maximize coping skills, provide positive support system. Participant is able to verbalize types and ability to use techniques and skills needed for reducing stress and depression.  Initial Review & Psychosocial Screening:   Quality of Life Scores:     Quality of Life - 07/16/15 1150    Quality of Life Scores   Health/Function Pre 7.4 %   Socioeconomic Pre 12.13 %   Psych/Spiritual Pre 12.36 %   Family Pre 20.4 %   GLOBAL Pre 11.33 %      PHQ-9:      Recent Review Flowsheet Data    There is no flowsheet data to display.      Psychosocial Evaluation and Intervention:   Psychosocial Re-Evaluation:   Vocational Rehabilitation: Provide vocational rehab assistance to qualifying candidates.   Vocational Rehab Evaluation & Intervention:   Education: Education Goals: Education classes will be provided on a weekly basis, covering required topics. Participant will state understanding/return demonstration of topics presented.  Learning Barriers/Preferences:     Learning Barriers/Preferences - 07/16/15 0819    Learning Barriers/Preferences   Learning Barriers None   Learning Preferences Written Material      Education Topics: Count Your Pulse:  -Group instruction provided by verbal instruction, demonstration, patient participation and written materials to support subject.  Instructors address importance of being able to find your pulse and how to count your pulse when at home without a heart monitor.  Patients get hands on experience counting their pulse with staff help and individually.   Heart Attack, Angina, and Risk Factor Modification:  -Group instruction provided by verbal instruction, video, and written materials to support subject.  Instructors address signs and symptoms of angina and heart attacks.    Also discuss risk factors for heart disease and how to make changes to improve heart health risk factors.   Functional Fitness:  -Group instruction provided by verbal instruction, demonstration, patient participation, and written materials to support subject.  Instructors address safety measures for doing things around the house.  Discuss how to get up and down off the floor, how to pick things up properly, how to safely get out of a chair without assistance, and balance training.   Meditation  and Mindfulness:  -Group instruction provided by verbal instruction, patient participation, and written materials to support  subject.  Instructor addresses importance of mindfulness and meditation practice to help reduce stress and improve awareness.  Instructor also leads participants through a meditation exercise.    Stretching for Flexibility and Mobility:  -Group instruction provided by verbal instruction, patient participation, and written materials to support subject.  Instructors lead participants through series of stretches that are designed to increase flexibility thus improving mobility.  These stretches are additional exercise for major muscle groups that are typically performed during regular warm up and cool down.   Hands Only CPR Anytime:  -Group instruction provided by verbal instruction, video, patient participation and written materials to support subject.  Instructors co-teach with AHA video for hands only CPR.  Participants get hands on experience with mannequins.   Nutrition I class: Heart Healthy Eating:  -Group instruction provided by PowerPoint slides, verbal discussion, and written materials to support subject matter. The instructor gives an explanation and review of the Therapeutic Lifestyle Changes diet recommendations, which includes a discussion on lipid goals, dietary fat, sodium, fiber, plant stanol/sterol esters, sugar, and the components of a well-balanced, healthy diet.   Nutrition II class: Lifestyle Skills:  -Group instruction provided by PowerPoint slides, verbal discussion, and written materials to support subject matter. The instructor gives an explanation and review of label reading, grocery shopping for heart health, heart healthy recipe modifications, and ways to make healthier choices when eating out.   Diabetes Question & Answer:  -Group instruction provided by PowerPoint slides, verbal discussion, and written materials to support subject matter. The instructor gives an explanation and review of diabetes co-morbidities, pre- and post-prandial blood glucose goals, pre-exercise  blood glucose goals, signs, symptoms, and treatment of hypoglycemia and hyperglycemia, and foot care basics.   Diabetes Blitz:  -Group instruction provided by PowerPoint slides, verbal discussion, and written materials to support subject matter. The instructor gives an explanation and review of the physiology behind type 1 and type 2 diabetes, diabetes medications and rational behind using different medications, pre- and post-prandial blood glucose recommendations and Hemoglobin A1c goals, diabetes diet, and exercise including blood glucose guidelines for exercising safely.    Portion Distortion:  -Group instruction provided by PowerPoint slides, verbal discussion, written materials, and food models to support subject matter. The instructor gives an explanation of serving size versus portion size, changes in portions sizes over the last 20 years, and what consists of a serving from each food group.   Stress Management:  -Group instruction provided by verbal instruction, video, and written materials to support subject matter.  Instructors review role of stress in heart disease and how to cope with stress positively.     Exercising on Your Own:  -Group instruction provided by verbal instruction, power point, and written materials to support subject.  Instructors discuss benefits of exercise, components of exercise, frequency and intensity of exercise, and end points for exercise.  Also discuss use of nitroglycerin and activating EMS.  Review options of places to exercise outside of rehab.  Review guidelines for sex with heart disease.   Cardiac Drugs I:  -Group instruction provided by verbal instruction and written materials to support subject.  Instructor reviews cardiac drug classes: antiplatelets, anticoagulants, beta blockers, and statins.  Instructor discusses reasons, side effects, and lifestyle considerations for each drug class.   Cardiac Drugs II:  -Group instruction provided by verbal  instruction and written materials to support subject.  Instructor reviews cardiac  drug classes: angiotensin converting enzyme inhibitors (ACE-I), angiotensin II receptor blockers (ARBs), nitrates, and calcium channel blockers.  Instructor discusses reasons, side effects, and lifestyle considerations for each drug class.   Anatomy and Physiology of the Circulatory System:  -Group instruction provided by verbal instruction, video, and written materials to support subject.  Reviews functional anatomy of heart, how it relates to various diagnoses, and what role the heart plays in the overall system.   Knowledge Questionnaire Score:     Knowledge Questionnaire Score - 07/16/15 1150    Knowledge Questionnaire Score   Pre Score 23/28      Core Components/Risk Factors/Patient Goals at Admission:     Personal Goals and Risk Factors at Admission - 07/16/15 0821    Core Components/Risk Factors/Patient Goals on Admission    Weight Management Yes;Weight Loss   Intervention Weight Management: Develop a combined nutrition and exercise program designed to reach desired caloric intake, while maintaining appropriate intake of nutrient and fiber, sodium and fats, and appropriate energy expenditure required for the weight goal.;Weight Management: Provide education and appropriate resources to help participant work on and attain dietary goals.;Weight Management/Obesity: Establish reasonable short term and long term weight goals.   Increase Strength and Stamina Yes   Intervention Develop an individualized exercise prescription for aerobic and resistive training based on initial evaluation findings, risk stratification, comorbidities and participant's personal goals.;Provide advice, education, support and counseling about physical activity/exercise needs.   Expected Outcomes Achievement of increased cardiorespiratory fitness and enhanced flexibility, muscular endurance and strength shown through measurements of  functional capacity and personal statement of participant.   Improve shortness of breath with ADL's Yes   Intervention Provide education, individualized exercise plan and daily activity instruction to help decrease symptoms of SOB with activities of daily living.   Expected Outcomes Short Term: Achieves a reduction of symptoms when performing activities of daily living.   Personal Goal Other Yes   Personal Goal Increase stamina and lose 5lbs, get back to exercising and hiking   Intervention increase exercise and functional capacity   Expected Outcomes Develop home exercise rotuine and improved SOB with ADL's      Core Components/Risk Factors/Patient Goals Review:    Core Components/Risk Factors/Patient Goals at Discharge (Final Review):    ITP Comments:     ITP Comments      07/16/15 0944           ITP Comments Dr. Fransico Him, MD, medical director          Comments: Pt in today for cardiac rehab orientation. Pt wife accompanied him.  As a part of orientation appointment, Pt completed 6 minute walk test.  Monitor showed SR with no noted ectopy. Pt scheduled to begin full exercise on April 24th. Cherre Huger, BSN

## 2015-07-20 ENCOUNTER — Encounter (HOSPITAL_COMMUNITY): Admission: RE | Admit: 2015-07-20 | Payer: BLUE CROSS/BLUE SHIELD | Source: Ambulatory Visit

## 2015-07-20 ENCOUNTER — Encounter (HOSPITAL_COMMUNITY)
Admission: RE | Admit: 2015-07-20 | Discharge: 2015-07-20 | Disposition: A | Discharge status: Home / Self Care | Payer: BLUE CROSS/BLUE SHIELD | Source: Ambulatory Visit | Attending: Cardiology | Admitting: Cardiology

## 2015-07-20 ENCOUNTER — Encounter (HOSPITAL_COMMUNITY): Payer: Self-pay

## 2015-07-20 ENCOUNTER — Ambulatory Visit (INDEPENDENT_AMBULATORY_CARE_PROVIDER_SITE_OTHER): Payer: BLUE CROSS/BLUE SHIELD | Admitting: Medical

## 2015-07-20 ENCOUNTER — Ambulatory Visit: Payer: BLUE CROSS/BLUE SHIELD | Admitting: Medical

## 2015-07-20 ENCOUNTER — Encounter: Payer: Self-pay | Admitting: Medical

## 2015-07-20 VITALS — BP 120/90 | HR 85 | Wt 190.0 lb

## 2015-07-20 DIAGNOSIS — N528 Other male erectile dysfunction: Secondary | ICD-10-CM | POA: Diagnosis not present

## 2015-07-20 DIAGNOSIS — Z951 Presence of aortocoronary bypass graft: Secondary | ICD-10-CM

## 2015-07-20 DIAGNOSIS — K219 Gastro-esophageal reflux disease without esophagitis: Secondary | ICD-10-CM

## 2015-07-20 DIAGNOSIS — E782 Mixed hyperlipidemia: Secondary | ICD-10-CM

## 2015-07-20 DIAGNOSIS — I2510 Atherosclerotic heart disease of native coronary artery without angina pectoris: Secondary | ICD-10-CM | POA: Diagnosis not present

## 2015-07-20 DIAGNOSIS — B351 Tinea unguium: Secondary | ICD-10-CM | POA: Diagnosis not present

## 2015-07-20 DIAGNOSIS — L609 Nail disorder, unspecified: Secondary | ICD-10-CM | POA: Diagnosis not present

## 2015-07-20 DIAGNOSIS — R0600 Dyspnea, unspecified: Secondary | ICD-10-CM

## 2015-07-20 DIAGNOSIS — R7301 Impaired fasting glucose: Secondary | ICD-10-CM | POA: Diagnosis not present

## 2015-07-20 DIAGNOSIS — I10 Essential (primary) hypertension: Secondary | ICD-10-CM | POA: Diagnosis not present

## 2015-07-20 DIAGNOSIS — F4321 Adjustment disorder with depressed mood: Secondary | ICD-10-CM

## 2015-07-20 DIAGNOSIS — L608 Other nail disorders: Secondary | ICD-10-CM | POA: Insufficient documentation

## 2015-07-20 HISTORY — DX: Other nail disorders: L60.8

## 2015-07-20 LAB — CBC
HCT: 43.2 % (ref 38.5–50.0)
HEMOGLOBIN: 14.8 g/dL (ref 13.2–17.1)
MCH: 29.3 pg (ref 27.0–33.0)
MCHC: 34.3 g/dL (ref 32.0–36.0)
MCV: 85.5 fL (ref 80.0–100.0)
MPV: 9.4 fL (ref 7.5–12.5)
PLATELETS: 315 10*3/uL (ref 140–400)
RBC: 5.05 MIL/uL (ref 4.20–5.80)
RDW: 14.2 % (ref 11.0–15.0)
WBC: 7 10*3/uL (ref 4.0–10.5)

## 2015-07-20 LAB — COMPREHENSIVE METABOLIC PANEL
ALBUMIN: 4.5 g/dL (ref 3.6–5.1)
ALT: 48 U/L — ABNORMAL HIGH (ref 9–46)
AST: 27 U/L (ref 10–40)
Alkaline Phosphatase: 65 U/L (ref 40–115)
BUN: 13 mg/dL (ref 7–25)
CALCIUM: 9.2 mg/dL (ref 8.6–10.3)
CHLORIDE: 99 mmol/L (ref 98–110)
CO2: 27 mmol/L (ref 20–31)
CREATININE: 0.74 mg/dL (ref 0.60–1.35)
Glucose, Bld: 97 mg/dL (ref 65–99)
POTASSIUM: 4.1 mmol/L (ref 3.5–5.3)
SODIUM: 138 mmol/L (ref 135–146)
TOTAL PROTEIN: 6.9 g/dL (ref 6.1–8.1)
Total Bilirubin: 0.7 mg/dL (ref 0.2–1.2)

## 2015-07-20 LAB — HEMOGLOBIN A1C
HEMOGLOBIN A1C: 5.4 % (ref <5.7)
MEAN PLASMA GLUCOSE: 108 mg/dL

## 2015-07-20 MED ORDER — BUPROPION HCL ER (XL) 150 MG PO TB24: 150.0000 mg | ORAL_TABLET | Freq: Every day | ORAL | Status: DC

## 2015-07-20 MED ORDER — ROSUVASTATIN CALCIUM 10 MG PO TABS: 10.0000 mg | ORAL_TABLET | Freq: Every day | ORAL | Status: DC

## 2015-07-20 MED ORDER — PANTOPRAZOLE SODIUM 40 MG PO TBEC: 40.0000 mg | DELAYED_RELEASE_TABLET | Freq: Every day | ORAL | Status: DC

## 2015-07-20 MED ORDER — CLOPIDOGREL BISULFATE 75 MG PO TABS: 75.0000 mg | ORAL_TABLET | Freq: Every day | ORAL | Status: DC

## 2015-07-20 MED ORDER — ALBUTEROL SULFATE HFA 108 (90 BASE) MCG/ACT IN AERS: 2.0000 | INHALATION_SPRAY | Freq: Four times a day (QID) | RESPIRATORY_TRACT | Status: DC | PRN

## 2015-07-20 MED ORDER — NIACIN ER 500 MG PO CPCR: 500.0000 mg | ORAL_CAPSULE | Freq: Every day | ORAL | Status: DC

## 2015-07-20 MED ORDER — NICOTINE POLACRILEX 4 MG MT LOZG: 4.0000 mg | LOZENGE | OROMUCOSAL | Status: DC | PRN

## 2015-07-20 NOTE — Progress Notes (Signed)
Subjective: Chief Complaint  Patient presents with  . Follow-up    pt states he needs refills: albuterol inhaler, plavix, lisinopril but with HCTZ, protonix, and crestor. had his surgery last month.    Here for f/u.   His health in the interim has been significant.    He was hospitalized 05/29/15 - 06/06/15 for NSTEMI, unstable angina, and is now s/p CABG x 3 vessel.  Not checking BPs.  Currently taking Aspirin 81mg  and Plavix daily.  He stopped smoking.  Uses Nicotine lozenges, 2 daily.   He notes that he is using albuterol every now and then for shortness of breath or at times gasps for air.  Never had this before in his life.   Smoked for 20 years, 1 ppd.  (06/01/15 normal PFTs reviewed from hospitalization).   He notes mood has been down.  He has been on Wellbutrin in remote past and would like to go back on this.  Denies HI/SI, but has been down in mood.  Still hasn't went back to work.     Wants to discuss toenail fungus.  This past year used Lamisil oral for 2 months, but not sure it helped.     Has hx/o bad GERD, wants to stay on medication for this.   Gets bad flare up if not on the medication.    Past Medical History  Diagnosis Date  . FH: premature coronary heart disease     Father had MI at age 65   . History of substance abuse     Pt had abused amphetamines : quit in 2005  . Hypertension 2005  . GERD (gastroesophageal reflux disease)   . Combined hyperlipidemia   . Impaired fasting blood sugar 2016  . Onychomycosis   . CAD (coronary artery disease), native coronary artery 06/01/2015    Cath 3/3 80% LAD, 99% diag, 50% circ, RCA 100% with Lto R collaterals  CABG 06/02/15 Dr. Cyndia Bent with LIMA to LAD, free RIMA to Diag and SVG to RCA     Past Surgical History  Procedure Laterality Date  . Rhinoplasty  20065    w/repair of nasal fractuare; done at [facility]   . Cardiac catheterization N/A 05/29/2015    Procedure: Left Heart Cath and Coronary Angiography;  Surgeon: Jettie Booze, MD;  Location: [facility];  Service: Cardiovascular;  Laterality: N/A;  . Cardiac catheterization  05/29/2015    Procedure: Coronary Balloon Angioplasty;  Surgeon: Jettie Booze, MD;  Location: [facility];  Service: Cardiovascular;;  . Coronary artery bypass graft N/A 06/02/2015    Procedure: CORONARY ARTERY BYPASS GRAFTING (CABG) x  3 utilizing bilateral internal mammary artery and endoscopically harvested right sapheneous vein.;  Surgeon: [name], MD;  Location: [place] [facility];  Service: Open Heart Surgery;  Laterality: N/A;  . Tee without cardioversion N/A 06/02/2015    Procedure: TRANSESOPHAGEAL ECHOCARDIOGRAM (TEE);  Surgeon: [name], MD;  Location: [place];  Service: Open Heart Surgery;  Laterality: N/A;     Objective: BP 120/90 mmHg  Pulse 85  Wt 190 lb (86.183 kg)  BP Readings from Last 3 Encounters:  07/20/15 120/90  07/16/15 128/80  07/01/15 115/87    Gen: wd, wn nad Central sternal surgical scar of chest Lungs clear Heart RRR normal s1, s2, no murmurs Ext: no edema Pulses 1+ UE and LE Neck: supple, nontender, no mass, no bruit, no thyromegaly Psych: pleasant, good eye contacts, seems a little down in mood  Assessment: Encounter Diagnoses  Name Primary?  . Coronary artery disease involving native coronary artery of native heart without angina pectoris Yes  . S/P CABG x 3   . Gastroesophageal reflux disease without esophagitis   . Essential hypertension   . Combined hyperlipidemia   . Impaired fasting blood sugar   . Other male erectile dysfunction   . Dyspnea   . Onychomycosis   . Nail deformity   . Adjustment disorder with depressed mood      Plan: CAD, s/p CABG - I reviewed his discharge summary, recent cardiothoracic surgery f/u notes, labs, imaging.  Also reviewed 05/2015 PFTs.  He has follow up with Dr. Wynonia Lawman cardiology this week  GERD - c/t Protonix, and discussed this in relation to safety with plavix.  Avoid GERD  triggers  HTN - I will defer this to Dr Wynonia Lawman Wednesday.  He was taking Lisinopril 20/25 prior to discharge, but came home on Lisinopril 10mg .   Not on beta blocker.  Will defer this to Dr. Wynonia Lawman this week.    hyperlipemia - c/t Crestor and Niaspan  Glad to hear he is stopped tobacco  Labs today given hx/o impaired glucose  Depressed mood - begin back on Wellbutrin  onychomycosis - nail culture sent  Dyspnea - likely related to deconditioning.  He is doing cardiac rehab.  Can c/t albuterol, but doubt he will need this in a few weeks once he is getting better exercise tolerance  Spent > 45 minutes face to face with patient in discussion of symptoms, evaluation, plan and recommendations.    Juel was seen today for follow-up.  Diagnoses and all orders for this visit:  Coronary artery disease involving native coronary artery of native heart without angina pectoris -     Comprehensive metabolic panel -     CBC  S/P CABG x 3 -     CBC  Gastroesophageal reflux disease without esophagitis -     CBC  Essential hypertension -     Comprehensive metabolic panel -     CBC  Combined hyperlipidemia -     Comprehensive metabolic panel -     CBC  Impaired fasting blood sugar -     Hemoglobin A1c  Other male erectile dysfunction  Dyspnea -     CBC  Onychomycosis  Nail deformity -     Fungus Culture with Smear  Adjustment disorder with depressed mood  Other orders -     albuterol (PROVENTIL HFA;VENTOLIN HFA) 108 (90 Base) MCG/ACT inhaler; Inhale 2 puffs into the lungs every 6 (six) hours as needed for wheezing or shortness of breath. -     clopidogrel (PLAVIX) 75 MG tablet; Take 1 tablet (75 mg total) by mouth daily. -     niacin 500 MG CR capsule; Take 1 capsule (500 mg total) by mouth at bedtime. -     pantoprazole (PROTONIX) 40 MG tablet; Take 1 tablet (40 mg total) by mouth daily before breakfast. Take 45 min prior to breakfast -     rosuvastatin (CRESTOR) 10 MG  tablet; Take 1 tablet (10 mg total) by mouth daily. -     nicotine polacrilex (COMMIT) 4 MG lozenge; Take 1 lozenge (4 mg total) by mouth as needed for smoking cessation. -     Discontinue: buPROPion (WELLBUTRIN XL) 150 MG 24 hr tablet; Take 1 tablet (150 mg total) by mouth daily. -     Discontinue: buPROPion (WELLBUTRIN XL) 150 MG 24 hr tablet; Take 1  tablet (150 mg total) by mouth daily. -     buPROPion (WELLBUTRIN XL) 150 MG 24 hr tablet; Take 1 tablet (150 mg total) by mouth daily.

## 2015-07-20 NOTE — Progress Notes (Signed)
Daily Session Note  Patient Details  Name: Donald Oliver MRN: [id number] Date of Birth: 13-Apr-1970 Referring Provider:        CARDIAC REHAB PHASE II ORIENTATION from 07/16/2015 in [place]   Referring Provider  Dr. Tollie Eth, MD      Encounter Date: 07/20/2015  Check In:     Session Check In - 07/20/15 1402    Check-In   Location [facility]   Staff Present Maurice Small, RN, BSN;Amber Fair, MS, ACSM RCEP, Exercise Physiologist;Joann Rion, RN, Marga Melnick, RN, BSN   Supervising physician immediately available to respond to emergencies [facility] immediately available   Physician(s) Dr. Marily Memos   Medication changes reported     No   Fall or balance concerns reported    No   Warm-up and Cool-down Performed as group-led instruction   Resistance Training Performed Yes   VAD Patient? No   Pain Assessment   Currently in Pain? No/denies   Multiple Pain Sites No      Capillary Blood Glucose: No results found for this or any previous visit (from the past 24 hour(s)).   Goals Met:  Exercise tolerated well  Goals Unmet: N/A   Comments: Pt started cardiac rehab today.  Pt tolerated light exercise without difficulty. VSS, telemetry-sinus rhythm, asymptomatic.  Medication list reconciled. Pt denies barriers to medicaiton compliance.  PSYCHOSOCIAL ASSESSMENT:  PHQ-4.  Pt admits to symptoms of depression prior to his cardiac event which have increased since being unable to work. Pt has discussed with his PCP and has been prescribed wellbutrin to help with depression and smoking cessation. Pt has stopped smoking however is using nicotine replacement products to ease cravings. Pt has weaned down from nicotine patches to lozenges. Pt instructed to contact [employer] [facility] for smoking cessation counseling. Pt is concerned about his inability to return to current job due to physical demands and job requirements. Pt given  vocational rehab packet.  Pt exhibits some coping skills, hopeful outlook with supportive family. No psychosocial needs identified at this time, no psychosocial interventions necessary.    Pt enjoys hiking, walking, housework and spending time with his children ages 70, 27 and 65.   Pt oriented to exercise equipment and routine.    Understanding verbalized.   Dr. Fransico Him is Medical Director for Cardiac Rehab at [facility].

## 2015-07-21 NOTE — Progress Notes (Signed)
Cardiac Individual Treatment Plan  Patient Details  Name: Donald Oliver MRN: [id number] Date of Birth: Jan 26, 1971 Referring Provider:        CARDIAC REHAB PHASE II ORIENTATION from 07/16/2015 in [place]   Referring Provider  Dr. Tollie Eth, MD      Initial Encounter Date:       CARDIAC REHAB PHASE II ORIENTATION from 07/16/2015 in [place]   Date  07/16/15   Referring Provider  Dr. Tollie Eth, MD      Visit Diagnosis: S/P CABG x 3  Patient's Home Medications on Admission:  Current outpatient prescriptions:  .  albuterol (PROVENTIL HFA;VENTOLIN HFA) 108 (90 Base) MCG/ACT inhaler, Inhale 2 puffs into the lungs every 6 (six) hours as needed for wheezing or shortness of breath., Disp: 1 Inhaler, Rfl: 1 .  aspirin 81 MG tablet, Take 81 mg by mouth daily., Disp: , Rfl:  .  clopidogrel (PLAVIX) 75 MG tablet, Take 1 tablet (75 mg total) by mouth daily., Disp: 90 tablet, Rfl: 1 .  lisinopril (PRINIVIL,ZESTRIL) 10 MG tablet, Take 1 tablet (10 mg total) by mouth daily., Disp: 30 tablet, Rfl: 1 .  Multiple Vitamin (MULTIVITAMIN WITH MINERALS) TABS tablet, Take 1 tablet by mouth daily. Reported on 07/20/2015, Disp: , Rfl:  .  niacin 500 MG CR capsule, Take 1 capsule (500 mg total) by mouth at bedtime., Disp: 90 capsule, Rfl: 3 .  nicotine polacrilex (COMMIT) 4 MG lozenge, Take 1 lozenge (4 mg total) by mouth as needed for smoking cessation., Disp: 100 tablet, Rfl: 0 .  pantoprazole (PROTONIX) 40 MG tablet, Take 1 tablet (40 mg total) by mouth daily before breakfast. Take 45 min prior to breakfast, Disp: 90 tablet, Rfl: 1 .  rosuvastatin (CRESTOR) 10 MG tablet, Take 1 tablet (10 mg total) by mouth daily., Disp: 90 tablet, Rfl: 3 .  buPROPion (WELLBUTRIN XL) 150 MG 24 hr tablet, Take 1 tablet (150 mg total) by mouth daily. (Patient not taking: Reported on 07/20/2015), Disp: 30 tablet, Rfl: 2 .  nicotine (NICODERM CQ - DOSED IN  MG/24 HOURS) 21 mg/24hr patch, Place 1 patch (21 mg total) onto the skin daily. (Patient not taking: Reported on 07/20/2015), Disp: 28 patch, Rfl: 0 .  oxyCODONE (OXY IR/ROXICODONE) 5 MG immediate release tablet, Take 1-2 tablets (5-10 mg total) by mouth every 4 (four) hours as needed for severe pain. (Patient not taking: Reported on 07/20/2015), Disp: 50 tablet, Rfl: 0 .  traMADol (ULTRAM) 50 MG tablet, Take 1 tablet (50 mg total) by mouth every 6 (six) hours as needed. (Patient not taking: Reported on 07/20/2015), Disp: 40 tablet, Rfl: 0  Past Medical History: Past Medical History  Diagnosis Date  . FH: premature coronary heart disease     Father had MI at age 62   . History of substance abuse     Pt had abused amphetamines : quit in 2005  . Hypertension 2005  . GERD (gastroesophageal reflux disease)   . Combined hyperlipidemia   . Impaired fasting blood sugar 2016  . Onychomycosis   . CAD (coronary artery disease), native coronary artery 06/01/2015    Cath 3/3 80% LAD, 99% diag, 50% circ, RCA 100% with Lto R collaterals  CABG 06/02/15 Dr. Cyndia Bent with LIMA to LAD, free RIMA to Diag and SVG to RCA      Tobacco Use: History  Smoking status  . Light Tobacco Smoker -- 1.00 packs/day for 30 years  Smokeless tobacco  . Never Used    Comment: Uses nicotene losanges    Labs:     Recent Review Flowsheet Data    Labs for ITP Cardiac and Pulmonary Rehab Latest Ref Rng 06/02/2015 06/02/2015 06/02/2015 06/03/2015 07/20/2015   Hemoglobin A1c <5.7 % - - - - 5.4   PHART 7.350 - 7.450 7.290(L) 7.325(L) - - -   PCO2ART 35.0 - 45.0 mmHg 41.1 46.1(H) - - -   HCO3 20.0 - 24.0 mEq/L 19.6(L) 23.8 - - -   TCO2 0 - 100 mmol/L 21 25 23 25  -   ACIDBASEDEF 0.0 - 2.0 mmol/L 6.0(H) 2.0 - - -   O2SAT - 86.0 89.0 - - -      Capillary Blood Glucose: Lab Results  Component Value Date   GLUCAP 88 06/06/2015   GLUCAP 96 06/06/2015   GLUCAP 92 06/05/2015   GLUCAP 91 06/05/2015   GLUCAP 98 06/05/2015     Exercise  Target Goals:    Exercise Program Goal: Individual exercise prescription set with THRR, safety & activity barriers. Participant demonstrates ability to understand and report RPE using BORG scale, to self-measure pulse accurately, and to acknowledge the importance of the exercise prescription.  Exercise Prescription Goal: Starting with aerobic activity 30 plus minutes a day, 3 days per week for initial exercise prescription. Provide home exercise prescription and guidelines that participant acknowledges understanding prior to discharge.  Activity Barriers & Risk Stratification:     Activity Barriers & Cardiac Risk Stratification - 07/16/15 0819    Activity Barriers & Cardiac Risk Stratification   Activity Barriers None   Cardiac Risk Stratification High      6 Minute Walk:     6 Minute Walk      07/16/15 0946       6 Minute Walk   Phase Initial     Distance 1264 feet     Walk Time 6 minutes     # of Rest Breaks 0     MPH 2.4     METS 3.94     RPE 11     VO2 Peak 13.79     Symptoms No     Resting HR 83 bpm     Resting BP 128/80 mmHg     Max Ex. HR 86 bpm     Max Ex. BP 118/70 mmHg     2 Minute Post BP 102/70 mmHg        Initial Exercise Prescription:     Initial Exercise Prescription - 07/16/15 1100    Date of Initial Exercise RX and Referring Provider   Date 07/16/15   Referring Provider Dr. Tollie Eth, MD   Treadmill   MPH 2.4   Grade 0   Minutes 10   METs 2.8   Bike   Level 1   METs 2   NuStep   Level 3   Minutes 10   METs 2   Prescription Details   Frequency (times per week) 3   Duration Progress to 30 minutes of continuous aerobic without signs/symptoms of physical distress   Intensity   THRR 40-80% of Max Heartrate 70-140   Ratings of Perceived Exertion 11-13   Progression   Progression Continue to progress workloads to maintain intensity without signs/symptoms of physical distress.   Resistance Training   Training Prescription Yes    Weight 2lbs   Reps 10-12      Perform Capillary Blood Glucose checks as needed.  Exercise Prescription Changes:  Exercise Comments:      Exercise Comments      07/23/15 1118           Exercise Comments pt is tolerating exercises well, no changes are made to the current exercise rx, will continue to monitor exercise progression          Discharge Exercise Prescription (Final Exercise Prescription Changes):   Nutrition:  Target Goals: Understanding of nutrition guidelines, daily intake of sodium 1500mg , cholesterol 200mg , calories 30% from fat and 7% or less from saturated fats, daily to have 5 or more servings of fruits and vegetables.  Biometrics:     Pre Biometrics - 07/16/15 0952    Pre Biometrics   Waist Circumference 41.75 inches   Hip Circumference 41 inches   Waist to Hip Ratio 1.02 %   Triceps Skinfold 20 mm   % Body Fat 29.1 %   Grip Strength 40 kg   Flexibility 0 in   Single Leg Stand 30 seconds       Nutrition Therapy Plan and Nutrition Goals:     Nutrition Therapy & Goals - 07/16/15 1018    Nutrition Therapy   Diet Therapeutic Lifestyle Changes   Personal Nutrition Goals   Personal Goal #1 Wt loss of 0.5-2 lb to a goal wt loss of 6-15 lb at graduation from [facility], educate and counsel regarding individualized specific dietary modifications aiming towards targeted core components such as weight, hypertension, lipid management, diabetes, heart failure and other comorbidities.   Expected Outcomes Short Term Goal: Understand basic principles of dietary content, such as calories, fat, sodium, cholesterol and nutrients.;Long Term Goal: Adherence to prescribed nutrition plan.      Nutrition Discharge: Nutrition Scores:     Nutrition Assessments - 07/22/15 1042    MEDFICTS Scores   Pre Score 64  will verify score with pt      Nutrition Goals Re-Evaluation:   Psychosocial: Target Goals:  Acknowledge presence or absence of depression, maximize coping skills, provide positive support system. Participant is able to verbalize types and ability to use techniques and skills needed for reducing stress and depression.  Initial Review & Psychosocial Screening:     Initial Psych Review & Screening - 07/20/15 1510    Initial Review   Current issues with Current Depression   Family Dynamics   Good Support System? Yes   Barriers   Psychosocial barriers to participate in program The patient should benefit from training in stress management and relaxation.   Screening Interventions   Interventions Encouraged to exercise      Quality of Life Scores:     Quality of Life - 07/16/15 1150    Quality of Life Scores   Health/Function Pre 7.4 %   Socioeconomic Pre 12.13 %   Psych/Spiritual Pre 12.36 %   Family Pre 20.4 %   GLOBAL Pre 11.33 %      PHQ-9:     Recent Review Flowsheet Data    Depression screen [facility] 2/9 07/20/2015   Decreased Interest 0   Down, Depressed, Hopeless 3   PHQ - 2 Score 3   Altered sleeping 0   Tired, decreased energy 0   Change in appetite 0   Feeling bad or failure about yourself  1   Trouble concentrating 0   Moving slowly or fidgety/restless 0   Suicidal thoughts 0   PHQ-9 Score 4   Difficult doing work/chores Somewhat difficult  Psychosocial Evaluation and Intervention:   Psychosocial Re-Evaluation:   Vocational Rehabilitation: Provide vocational rehab assistance to qualifying candidates.   Vocational Rehab Evaluation & Intervention:     Vocational Rehab - 07/16/15 1405    Initial Vocational Rehab Evaluation & Intervention   Assessment shows need for Vocational Rehabilitation Yes      Education: Education Goals: Education classes will be provided on a weekly basis, covering required topics. Participant will state understanding/return demonstration of topics presented.  Learning Barriers/Preferences:     Learning  Barriers/Preferences - 07/16/15 0819    Learning Barriers/Preferences   Learning Barriers None   Learning Preferences Written Material      Education Topics: Count Your Pulse:  -Group instruction provided by verbal instruction, demonstration, patient participation and written materials to support subject.  Instructors address importance of being able to find your pulse and how to count your pulse when at home without a heart monitor.  Patients get hands on experience counting their pulse with staff help and individually.   Heart Attack, Angina, and Risk Factor Modification:  -Group instruction provided by verbal instruction, video, and written materials to support subject.  Instructors address signs and symptoms of angina and heart attacks.    Also discuss risk factors for heart disease and how to make changes to improve heart health risk factors.   Functional Fitness:  -Group instruction provided by verbal instruction, demonstration, patient participation, and written materials to support subject.  Instructors address safety measures for doing things around the house.  Discuss how to get up and down off the floor, how to pick things up properly, how to safely get out of a chair without assistance, and balance training.   Meditation and Mindfulness:  -Group instruction provided by verbal instruction, patient participation, and written materials to support subject.  Instructor addresses importance of mindfulness and meditation practice to help reduce stress and improve awareness.  Instructor also leads participants through a meditation exercise.    Stretching for Flexibility and Mobility:  -Group instruction provided by verbal instruction, patient participation, and written materials to support subject.  Instructors lead participants through series of stretches that are designed to increase flexibility thus improving mobility.  These stretches are additional exercise for major muscle groups  that are typically performed during regular warm up and cool down.   Hands Only CPR Anytime:  -Group instruction provided by verbal instruction, video, patient participation and written materials to support subject.  Instructors co-teach with AHA video for hands only CPR.  Participants get hands on experience with mannequins.   Nutrition I class: Heart Healthy Eating:  -Group instruction provided by PowerPoint slides, verbal discussion, and written materials to support subject matter. The instructor gives an explanation and review of the Therapeutic Lifestyle Changes diet recommendations, which includes a discussion on lipid goals, dietary fat, sodium, fiber, plant stanol/sterol esters, sugar, and the components of a well-balanced, healthy diet.   Nutrition II class: Lifestyle Skills:  -Group instruction provided by PowerPoint slides, verbal discussion, and written materials to support subject matter. The instructor gives an explanation and review of label reading, grocery shopping for heart health, heart healthy recipe modifications, and ways to make healthier choices when eating out.   Diabetes Question & Answer:  -Group instruction provided by PowerPoint slides, verbal discussion, and written materials to support subject matter. The instructor gives an explanation and review of diabetes co-morbidities, pre- and post-prandial blood glucose goals, pre-exercise blood glucose goals, signs, symptoms, and treatment of hypoglycemia and hyperglycemia, and  foot care basics.   Diabetes Blitz:  -Group instruction provided by PowerPoint slides, verbal discussion, and written materials to support subject matter. The instructor gives an explanation and review of the physiology behind type 1 and type 2 diabetes, diabetes medications and rational behind using different medications, pre- and post-prandial blood glucose recommendations and Hemoglobin A1c goals, diabetes diet, and exercise including blood  glucose guidelines for exercising safely.    Portion Distortion:  -Group instruction provided by PowerPoint slides, verbal discussion, written materials, and food models to support subject matter. The instructor gives an explanation of serving size versus portion size, changes in portions sizes over the last 20 years, and what consists of a serving from each food group.   Stress Management:  -Group instruction provided by verbal instruction, video, and written materials to support subject matter.  Instructors review role of stress in heart disease and how to cope with stress positively.     Exercising on Your Own:  -Group instruction provided by verbal instruction, power point, and written materials to support subject.  Instructors discuss benefits of exercise, components of exercise, frequency and intensity of exercise, and end points for exercise.  Also discuss use of nitroglycerin and activating EMS.  Review options of places to exercise outside of rehab.  Review guidelines for sex with heart disease.   Cardiac Drugs I:  -Group instruction provided by verbal instruction and written materials to support subject.  Instructor reviews cardiac drug classes: antiplatelets, anticoagulants, beta blockers, and statins.  Instructor discusses reasons, side effects, and lifestyle considerations for each drug class.   Cardiac Drugs II:  -Group instruction provided by verbal instruction and written materials to support subject.  Instructor reviews cardiac drug classes: angiotensin converting enzyme inhibitors (ACE-I), angiotensin II receptor blockers (ARBs), nitrates, and calcium channel blockers.  Instructor discusses reasons, side effects, and lifestyle considerations for each drug class.   Anatomy and Physiology of the Circulatory System:  -Group instruction provided by verbal instruction, video, and written materials to support subject.  Reviews functional anatomy of heart, how it relates to various  diagnoses, and what role the heart plays in the overall system.   Knowledge Questionnaire Score:     Knowledge Questionnaire Score - 07/16/15 1150    Knowledge Questionnaire Score   Pre Score 23/28      Core Components/Risk Factors/Patient Goals at Admission:     Personal Goals and Risk Factors at Admission - 07/16/15 0821    Core Components/Risk Factors/Patient Goals on Admission    Weight Management Yes;Weight Loss   Intervention Weight Management: Develop a combined nutrition and exercise program designed to reach desired caloric intake, while maintaining appropriate intake of nutrient and fiber, sodium and fats, and appropriate energy expenditure required for the weight goal.;Weight Management: Provide education and appropriate resources to help participant work on and attain dietary goals.;Weight Management/Obesity: Establish reasonable short term and long term weight goals.   Increase Strength and Stamina Yes   Intervention Develop an individualized exercise prescription for aerobic and resistive training based on initial evaluation findings, risk stratification, comorbidities and participant's personal goals.;Provide advice, education, support and counseling about physical activity/exercise needs.   Expected Outcomes Achievement of increased cardiorespiratory fitness and enhanced flexibility, muscular endurance and strength shown through measurements of functional capacity and personal statement of participant.   Improve shortness of breath with ADL's Yes   Intervention Provide education, individualized exercise plan and daily activity instruction to help decrease symptoms of SOB with activities of daily living.  Expected Outcomes Short Term: Achieves a reduction of symptoms when performing activities of daily living.   Personal Goal Other Yes   Personal Goal Increase stamina and lose 5lbs, get back to exercising and hiking   Intervention increase exercise and functional capacity    Expected Outcomes Develop home exercise rotuine and improved SOB with ADL's      Core Components/Risk Factors/Patient Goals Review:    Core Components/Risk Factors/Patient Goals at Discharge (Final Review):    ITP Comments:     ITP Comments      07/16/15 0944           ITP Comments Dr. Fransico Him, MD, medical director          Comments: Pt is making expected progress toward personal goals after completing 3 sessions. Recommend continued exercise and life style modification education including  stress management and relaxation techniques to decrease cardiac risk profile.

## 2015-07-22 ENCOUNTER — Encounter (HOSPITAL_COMMUNITY): Payer: BLUE CROSS/BLUE SHIELD

## 2015-07-22 ENCOUNTER — Encounter (HOSPITAL_COMMUNITY)
Admission: RE | Admit: 2015-07-22 | Discharge: 2015-07-22 | Disposition: A | Discharge status: Home / Self Care | Payer: BLUE CROSS/BLUE SHIELD | Source: Ambulatory Visit | Attending: Cardiology | Admitting: Cardiology

## 2015-07-22 DIAGNOSIS — Z951 Presence of aortocoronary bypass graft: Secondary | ICD-10-CM

## 2015-07-24 ENCOUNTER — Encounter (HOSPITAL_COMMUNITY): Payer: BLUE CROSS/BLUE SHIELD

## 2015-07-24 ENCOUNTER — Encounter (HOSPITAL_COMMUNITY)
Admission: RE | Admit: 2015-07-24 | Discharge: 2015-07-24 | Disposition: A | Discharge status: Home / Self Care | Payer: BLUE CROSS/BLUE SHIELD | Source: Ambulatory Visit | Attending: Cardiology | Admitting: Cardiology

## 2015-07-24 DIAGNOSIS — Z951 Presence of aortocoronary bypass graft: Secondary | ICD-10-CM | POA: Diagnosis not present

## 2015-07-27 ENCOUNTER — Encounter (HOSPITAL_COMMUNITY)
Admission: RE | Admit: 2015-07-27 | Discharge: 2015-07-27 | Disposition: A | Discharge status: Home / Self Care | Payer: BLUE CROSS/BLUE SHIELD | Source: Ambulatory Visit | Attending: Cardiology | Admitting: Cardiology

## 2015-07-27 ENCOUNTER — Encounter (HOSPITAL_COMMUNITY): Payer: BLUE CROSS/BLUE SHIELD

## 2015-07-27 DIAGNOSIS — Z951 Presence of aortocoronary bypass graft: Secondary | ICD-10-CM

## 2015-07-27 DIAGNOSIS — E782 Mixed hyperlipidemia: Secondary | ICD-10-CM | POA: Diagnosis not present

## 2015-07-27 DIAGNOSIS — Z8249 Family history of ischemic heart disease and other diseases of the circulatory system: Secondary | ICD-10-CM | POA: Insufficient documentation

## 2015-07-27 DIAGNOSIS — Z7902 Long term (current) use of antithrombotics/antiplatelets: Secondary | ICD-10-CM | POA: Diagnosis not present

## 2015-07-27 DIAGNOSIS — Z7982 Long term (current) use of aspirin: Secondary | ICD-10-CM | POA: Insufficient documentation

## 2015-07-27 DIAGNOSIS — K219 Gastro-esophageal reflux disease without esophagitis: Secondary | ICD-10-CM | POA: Insufficient documentation

## 2015-07-27 DIAGNOSIS — F17210 Nicotine dependence, cigarettes, uncomplicated: Secondary | ICD-10-CM | POA: Insufficient documentation

## 2015-07-27 DIAGNOSIS — I2510 Atherosclerotic heart disease of native coronary artery without angina pectoris: Secondary | ICD-10-CM | POA: Diagnosis not present

## 2015-07-27 DIAGNOSIS — Z79899 Other long term (current) drug therapy: Secondary | ICD-10-CM | POA: Insufficient documentation

## 2015-07-27 DIAGNOSIS — I10 Essential (primary) hypertension: Secondary | ICD-10-CM | POA: Diagnosis not present

## 2015-07-29 ENCOUNTER — Encounter (HOSPITAL_COMMUNITY)
Admission: RE | Admit: 2015-07-29 | Discharge: 2015-07-29 | Disposition: A | Discharge status: Home / Self Care | Payer: BLUE CROSS/BLUE SHIELD | Source: Ambulatory Visit | Attending: Cardiology | Admitting: Cardiology

## 2015-07-29 ENCOUNTER — Encounter (HOSPITAL_COMMUNITY): Payer: BLUE CROSS/BLUE SHIELD

## 2015-07-29 DIAGNOSIS — Z951 Presence of aortocoronary bypass graft: Secondary | ICD-10-CM | POA: Diagnosis not present

## 2015-07-29 NOTE — Progress Notes (Addendum)
Donald Oliver 45 y.o. male Nutrition Note Spoke with pt. Pt is overweight and c/o weight gain from his pre-surgical wt of 175 lb. Pt has gained 7.7 lb (3.5 kg) over the past 2 weeks. Pt feels wt gain due to eating more "out of boredom and not being as active" as he was previously. Nutrition Survey reviewed with pt. Pt is following Step 1 of the Therapeutic Lifestyle Changes diet. Pt lipids reviewed with pt. Pt states his TG were "over a 1000 one time." Dietary recommendations to help decrease TG discussed. Pt states he could cut back on his sugar intake. Pt drinks Gatorade during the summer due to working outside and "sweating a lot." Alternatives to sugary beverages made from sugar and/or corn syrup discussed. Pt screened as pre-diabetic in March 2017. Pt is aware of pre-diabetes. Pt has a family h/o DM per discussion. A1c re-check WNL. Pt expressed understanding of the information reviewed. Pt aware of nutrition education classes offered. Lab Results  Component Value Date   HGBA1C 5.4 07/20/2015   Wt Readings from Last 3 Encounters:  07/20/15 190 lb (86.183 kg)  07/16/15 187 lb 6.3 oz (85 kg)  07/01/15 182 lb (82.555 kg)   Nutrition Diagnosis ? Food-and nutrition-related knowledge deficit related to lack of exposure to information as related to diagnosis of: ? CVD ? Pre-DM ? Overweight related to excessive energy intake as evidenced by a BMI of 26.9  Nutrition Intervention ? Benefits of adopting Therapeutic Lifestyle Changes discussed when Medficts reviewed. ? Pt to attend the Portion Distortion class - met; 07/22/15 ? Pt to attend the  ? Nutrition I class - met; 07/28/15                    ? Nutrition II class ? Pt given handouts for: ? 5-day, 1800 kcal menu ideas ? Continue client-centered nutrition education by RD, as part of interdisciplinary care.  Goal(s) ? Pt to identify and limit food sources of saturated fat, trans fat, and sodium ? Pt to identify food quantities necessary to  achieve weight loss of 6-24 lb (2.7-10.9 kg) at graduation from cardiac rehab.  ? Pt to watch out for sweets and added sugar.  Monitor and Evaluate progress toward nutrition goal with team.  Derek Mound, M.Ed, RD, LDN, CDE 07/29/2015 2:28 PM

## 2015-07-29 NOTE — Progress Notes (Signed)
QUALITY OF LIFE SCORE REVIEW  Pt completed Quality of Life survey as a participant in Cardiac Rehab. Scores 21.0 or below are considered low. Pt score very low in several areas Overall 11, Health and Function 7, socioeconomic 12, physiological and spiritual 12, family 35. Patient quality of life slightly altered by physical constraints which limits ability to perform as prior to recent cardiac illness.  Pt has chest wall sensitivity, incisional discomfort.  The discomfort and the chest wall activity restrictions has caused major lifestyle changes.  Pt denies angina.  Pt has fleeting episodes of dyspnea.  Overall pt reports he is depressed, which has negatively affected his quality of life scores.  Pt states he has discussed with his PCP and has started on welbutrin. Pt accepting of offer for appointment with Jeanella Craze for counseling.   Offered emotional support and reassurance.  Will continue to monitor and intervene as necessary.

## 2015-07-31 ENCOUNTER — Encounter (HOSPITAL_COMMUNITY): Payer: BLUE CROSS/BLUE SHIELD

## 2015-07-31 ENCOUNTER — Encounter (HOSPITAL_COMMUNITY)
Admission: RE | Admit: 2015-07-31 | Discharge: 2015-07-31 | Disposition: A | Discharge status: Home / Self Care | Payer: BLUE CROSS/BLUE SHIELD | Source: Ambulatory Visit | Attending: Cardiology | Admitting: Cardiology

## 2015-07-31 DIAGNOSIS — Z951 Presence of aortocoronary bypass graft: Secondary | ICD-10-CM | POA: Diagnosis not present

## 2015-08-03 ENCOUNTER — Encounter (HOSPITAL_COMMUNITY)
Admission: RE | Admit: 2015-08-03 | Discharge: 2015-08-03 | Disposition: A | Discharge status: Home / Self Care | Payer: BLUE CROSS/BLUE SHIELD | Source: Ambulatory Visit | Attending: Cardiology | Admitting: Cardiology

## 2015-08-03 ENCOUNTER — Encounter (HOSPITAL_COMMUNITY): Payer: BLUE CROSS/BLUE SHIELD

## 2015-08-03 DIAGNOSIS — Z951 Presence of aortocoronary bypass graft: Secondary | ICD-10-CM | POA: Diagnosis not present

## 2015-08-05 ENCOUNTER — Encounter (HOSPITAL_COMMUNITY)
Admission: RE | Admit: 2015-08-05 | Discharge: 2015-08-05 | Disposition: A | Discharge status: Home / Self Care | Payer: BLUE CROSS/BLUE SHIELD | Source: Ambulatory Visit | Attending: Cardiology | Admitting: Cardiology

## 2015-08-05 ENCOUNTER — Encounter (HOSPITAL_COMMUNITY): Payer: BLUE CROSS/BLUE SHIELD

## 2015-08-05 DIAGNOSIS — Z951 Presence of aortocoronary bypass graft: Secondary | ICD-10-CM

## 2015-08-05 NOTE — Progress Notes (Signed)
Reviewed home exercise with pt today.  Pt plans to walk and do handheld weights for exercise, 3x/week in addition to cardiac rehab.  Reviewed THR, pulse, RPE, sign and symptoms, and when to call 911 or MD.  Also discussed weather considerations and indoor options.  Pt voiced understanding.   Starlette Thurow [employer]

## 2015-08-07 ENCOUNTER — Encounter (HOSPITAL_COMMUNITY): Payer: BLUE CROSS/BLUE SHIELD

## 2015-08-07 ENCOUNTER — Encounter (HOSPITAL_COMMUNITY)
Admission: RE | Admit: 2015-08-07 | Discharge: 2015-08-07 | Disposition: A | Discharge status: Home / Self Care | Payer: BLUE CROSS/BLUE SHIELD | Source: Ambulatory Visit | Attending: Cardiology | Admitting: Cardiology

## 2015-08-07 DIAGNOSIS — Z951 Presence of aortocoronary bypass graft: Secondary | ICD-10-CM

## 2015-08-10 ENCOUNTER — Encounter (HOSPITAL_COMMUNITY)
Admission: RE | Admit: 2015-08-10 | Discharge: 2015-08-10 | Disposition: A | Discharge status: Home / Self Care | Payer: BLUE CROSS/BLUE SHIELD | Source: Ambulatory Visit | Attending: Cardiology | Admitting: Cardiology

## 2015-08-10 ENCOUNTER — Encounter (HOSPITAL_COMMUNITY): Payer: BLUE CROSS/BLUE SHIELD

## 2015-08-10 DIAGNOSIS — Z951 Presence of aortocoronary bypass graft: Secondary | ICD-10-CM

## 2015-08-12 ENCOUNTER — Encounter (HOSPITAL_COMMUNITY)
Admission: RE | Admit: 2015-08-12 | Discharge: 2015-08-12 | Disposition: A | Discharge status: Home / Self Care | Payer: BLUE CROSS/BLUE SHIELD | Source: Ambulatory Visit | Attending: Cardiology | Admitting: Cardiology

## 2015-08-12 ENCOUNTER — Encounter (HOSPITAL_COMMUNITY): Payer: BLUE CROSS/BLUE SHIELD

## 2015-08-12 ENCOUNTER — Encounter: Payer: Self-pay | Admitting: Medical

## 2015-08-12 DIAGNOSIS — Z951 Presence of aortocoronary bypass graft: Secondary | ICD-10-CM | POA: Diagnosis not present

## 2015-08-12 NOTE — Progress Notes (Signed)
Cardiac Individual Treatment Plan  Patient Details  Name: Donald Oliver MRN: [id number] Date of Birth: Dec 12, 1970 Referring Provider:        CARDIAC REHAB PHASE II ORIENTATION from 07/16/2015 in [place]   Referring Provider  Dr. Tollie Eth, MD      Initial Encounter Date:       CARDIAC REHAB PHASE II ORIENTATION from 07/16/2015 in [place]   Date  07/16/15   Referring Provider  Dr. Tollie Eth, MD      Visit Diagnosis: S/P CABG x 3  Patient's Home Medications on Admission:  Current outpatient prescriptions:  .  albuterol (PROVENTIL HFA;VENTOLIN HFA) 108 (90 Base) MCG/ACT inhaler, Inhale 2 puffs into the lungs every 6 (six) hours as needed for wheezing or shortness of breath., Disp: 1 Inhaler, Rfl: 1 .  aspirin 81 MG tablet, Take 81 mg by mouth daily., Disp: , Rfl:  .  buPROPion (WELLBUTRIN XL) 150 MG 24 hr tablet, Take 1 tablet (150 mg total) by mouth daily. (Patient not taking: Reported on 07/20/2015), Disp: 30 tablet, Rfl: 2 .  clopidogrel (PLAVIX) 75 MG tablet, Take 1 tablet (75 mg total) by mouth daily., Disp: 90 tablet, Rfl: 1 .  lisinopril (PRINIVIL,ZESTRIL) 10 MG tablet, Take 1 tablet (10 mg total) by mouth daily., Disp: 30 tablet, Rfl: 1 .  Multiple Vitamin (MULTIVITAMIN WITH MINERALS) TABS tablet, Take 1 tablet by mouth daily. Reported on 07/20/2015, Disp: , Rfl:  .  niacin 500 MG CR capsule, Take 1 capsule (500 mg total) by mouth at bedtime., Disp: 90 capsule, Rfl: 3 .  nicotine (NICODERM CQ - DOSED IN MG/24 HOURS) 21 mg/24hr patch, Place 1 patch (21 mg total) onto the skin daily. (Patient not taking: Reported on 07/20/2015), Disp: 28 patch, Rfl: 0 .  nicotine polacrilex (COMMIT) 4 MG lozenge, Take 1 lozenge (4 mg total) by mouth as needed for smoking cessation., Disp: 100 tablet, Rfl: 0 .  oxyCODONE (OXY IR/ROXICODONE) 5 MG immediate release tablet, Take 1-2 tablets (5-10 mg total) by mouth every 4  (four) hours as needed for severe pain. (Patient not taking: Reported on 07/20/2015), Disp: 50 tablet, Rfl: 0 .  pantoprazole (PROTONIX) 40 MG tablet, Take 1 tablet (40 mg total) by mouth daily before breakfast. Take 45 min prior to breakfast, Disp: 90 tablet, Rfl: 1 .  rosuvastatin (CRESTOR) 10 MG tablet, Take 1 tablet (10 mg total) by mouth daily., Disp: 90 tablet, Rfl: 3 .  traMADol (ULTRAM) 50 MG tablet, Take 1 tablet (50 mg total) by mouth every 6 (six) hours as needed. (Patient not taking: Reported on 07/20/2015), Disp: 40 tablet, Rfl: 0  Past Medical History: Past Medical History  Diagnosis Date  . FH: premature coronary heart disease     Father had MI at age 48   . History of substance abuse     Pt had abused amphetamines : quit in 2005  . Hypertension 2005  . GERD (gastroesophageal reflux disease)   . Combined hyperlipidemia   . Impaired fasting blood sugar 2016  . Onychomycosis   . CAD (coronary artery disease), native coronary artery 06/01/2015    Cath 3/3 80% LAD, 99% diag, 50% circ, RCA 100% with Lto R collaterals  CABG 06/02/15 Dr. Cyndia Bent with LIMA to LAD, free RIMA to Diag and SVG to RCA      Tobacco Use: History  Smoking status  . Light Tobacco Smoker -- 1.00 packs/day for 30 years  Smokeless tobacco  . Never Used    Comment: Uses nicotene losanges    Labs: Recent Review Flowsheet Data    Labs for ITP Cardiac and Pulmonary Rehab Latest Ref Rng 06/02/2015 06/02/2015 06/02/2015 06/03/2015 07/20/2015   Hemoglobin A1c <5.7 % - - - - 5.4   PHART 7.350 - 7.450 7.290(L) 7.325(L) - - -   PCO2ART 35.0 - 45.0 mmHg 41.1 46.1(H) - - -   HCO3 20.0 - 24.0 mEq/L 19.6(L) 23.8 - - -   TCO2 0 - 100 mmol/L 21 25 23 25  -   ACIDBASEDEF 0.0 - 2.0 mmol/L 6.0(H) 2.0 - - -   O2SAT - 86.0 89.0 - - -      Capillary Blood Glucose: Lab Results  Component Value Date   GLUCAP 88 06/06/2015   GLUCAP 96 06/06/2015   GLUCAP 92 06/05/2015   GLUCAP 91 06/05/2015   GLUCAP 98 06/05/2015     Exercise  Target Goals:    Exercise Program Goal: Individual exercise prescription set with THRR, safety & activity barriers. Participant demonstrates ability to understand and report RPE using BORG scale, to self-measure pulse accurately, and to acknowledge the importance of the exercise prescription.  Exercise Prescription Goal: Starting with aerobic activity 30 plus minutes a day, 3 days per week for initial exercise prescription. Provide home exercise prescription and guidelines that participant acknowledges understanding prior to discharge.  Activity Barriers & Risk Stratification:     Activity Barriers & Cardiac Risk Stratification - 07/16/15 0819    Activity Barriers & Cardiac Risk Stratification   Activity Barriers None   Cardiac Risk Stratification High      6 Minute Walk:     6 Minute Walk      07/16/15 0946       6 Minute Walk   Phase Initial     Distance 1264 feet     Walk Time 6 minutes     # of Rest Breaks 0     MPH 2.4     METS 3.94     RPE 11     VO2 Peak 13.79     Symptoms No     Resting HR 83 bpm     Resting BP 128/80 mmHg     Max Ex. HR 86 bpm     Max Ex. BP 118/70 mmHg     2 Minute Post BP 102/70 mmHg        Initial Exercise Prescription:     Initial Exercise Prescription - 07/16/15 1100    Date of Initial Exercise RX and Referring Provider   Date 07/16/15   Referring Provider Dr. Tollie Eth, MD   Treadmill   MPH 2.4   Grade 0   Minutes 10   METs 2.8   Bike   Level 1   METs 2   NuStep   Level 3   Minutes 10   METs 2   Prescription Details   Frequency (times per week) 3   Duration Progress to 30 minutes of continuous aerobic without signs/symptoms of physical distress   Intensity   THRR 40-80% of Max Heartrate 70-140   Ratings of Perceived Exertion 11-13   Progression   Progression Continue to progress workloads to maintain intensity without signs/symptoms of physical distress.   Resistance Training   Training Prescription Yes    Weight 2lbs   Reps 10-12      Perform Capillary Blood Glucose checks as needed.  Exercise Prescription Changes:  Exercise Prescription Changes      08/12/15 1500           Exercise Review   Progression Yes       Response to Exercise   Blood Pressure (Admit) 122/82 mmHg       Blood Pressure (Exercise) 164/80 mmHg       Blood Pressure (Exit) 104/60 mmHg       Heart Rate (Admit) 97 bpm       Heart Rate (Exercise) 131 bpm       Heart Rate (Exit) 95 bpm       Rating of Perceived Exertion (Exercise) 12       Intensity THRR unchanged       Progression   Progression Continue to progress workloads to maintain intensity without signs/symptoms of physical distress.       Average METs 4.8       Resistance Training   Training Prescription Yes       Weight 3lbs       Reps 10-12       Treadmill   MPH 3       Grade 2       Minutes 10       METs 4.12       Bike   Level 1.4       METs 4.03       NuStep   Level 3       Minutes 10       METs 2.6          Exercise Comments:     Exercise Comments      07/23/15 1118 08/12/15 1505         Exercise Comments pt is tolerating exercises well, no changes are made to the current exercise rx, will continue to monitor exercise progression pt is tolerating exercise well, Will continue to monitor exercise progression         Discharge Exercise Prescription (Final Exercise Prescription Changes):     Exercise Prescription Changes - 08/12/15 1500    Exercise Review   Progression Yes   Response to Exercise   Blood Pressure (Admit) 122/82 mmHg   Blood Pressure (Exercise) 164/80 mmHg   Blood Pressure (Exit) 104/60 mmHg   Heart Rate (Admit) 97 bpm   Heart Rate (Exercise) 131 bpm   Heart Rate (Exit) 95 bpm   Rating of Perceived Exertion (Exercise) 12   Intensity THRR unchanged   Progression   Progression Continue to progress workloads to maintain intensity without signs/symptoms of physical distress.   Average METs 4.8    Resistance Training   Training Prescription Yes   Weight 3lbs   Reps 10-12   Treadmill   MPH 3   Grade 2   Minutes 10   METs 4.12   Bike   Level 1.4   METs 4.03   NuStep   Level 3   Minutes 10   METs 2.6      Nutrition:  Target Goals: Understanding of nutrition guidelines, daily intake of sodium 1500mg , cholesterol 200mg , calories 30% from fat and 7% or less from saturated fats, daily to have 5 or more servings of fruits and vegetables.  Biometrics:     Pre Biometrics - 07/16/15 0952    Pre Biometrics   Waist Circumference 41.75 inches   Hip Circumference 41 inches   Waist to Hip Ratio 1.02 %   Triceps Skinfold 20 mm   % Body Fat 29.1 %   Grip  Strength 40 kg   Flexibility 0 in   Single Leg Stand 30 seconds       Nutrition Therapy Plan and Nutrition Goals:     Nutrition Therapy & Goals - 07/16/15 1018    Nutrition Therapy   Diet Therapeutic Lifestyle Changes   Personal Nutrition Goals   Personal Goal #1 Wt loss of 0.5-2 lb to a goal wt loss of 6-15 lb at graduation from [place], educate and counsel regarding individualized specific dietary modifications aiming towards targeted core components such as weight, hypertension, lipid management, diabetes, heart failure and other comorbidities.   Expected Outcomes Short Term Goal: Understand basic principles of dietary content, such as calories, fat, sodium, cholesterol and nutrients.;Long Term Goal: Adherence to prescribed nutrition plan.      Nutrition Discharge: Nutrition Scores:     Nutrition Assessments - 07/29/15 1424    MEDFICTS Scores   Pre Score 64      Nutrition Goals Re-Evaluation:     Nutrition Goals Re-Evaluation      07/29/15 1426           Personal Goal #1 Re-Evaluation   Personal Goal #1 Wt loss of 0.5-2 lb to a goal wt loss of 6-15 lb at graduation from Cardiac Rehab       Goal Progress Seen No       Comments Pt wt is up 7.7 lb from  admission. Encourage slow wt loss of 0.5-2 lb/wk.       Weight   Current Weight 189 lb 6.4 oz (85.911 kg)       Intervention Plan   Intervention Continue to educate, counsel and set short/long term goals regarding individualized specific personal dietary modifications.;Nutrition handout(s) given to patient.  5 day, 1800 kcal menu ideas given       Comments see 07/29/15 RD note          Psychosocial: Target Goals: Acknowledge presence or absence of depression, maximize coping skills, provide positive support system. Participant is able to verbalize types and ability to use techniques and skills needed for reducing stress and depression.  Initial Review & Psychosocial Screening:     Initial Psych Review & Screening - 07/20/15 1510    Initial Review   Current issues with Current Depression   Family Dynamics   Good Support System? Yes   Barriers   Psychosocial barriers to participate in program The patient should benefit from training in stress management and relaxation.   Screening Interventions   Interventions Encouraged to exercise      Quality of Life Scores:     Quality of Life - 07/29/15 1650    Quality of Life Scores   GLOBAL Pre --  see progress note      PHQ-9:     Recent Review Flowsheet Data    Depression screen [facility] 2/9 07/20/2015   Decreased Interest 0   Down, Depressed, Hopeless 3   PHQ - 2 Score 3   Altered sleeping 0   Tired, decreased energy 0   Change in appetite 0   Feeling bad or failure about yourself  1   Trouble concentrating 0   Moving slowly or fidgety/restless 0   Suicidal thoughts 0   PHQ-9 Score 4   Difficult doing work/chores Somewhat difficult      Psychosocial Evaluation and Intervention:     Psychosocial Evaluation - 07/31/15 1553    Psychosocial Evaluation & Interventions   Interventions Therapist  referral   Comments appt scheduled with Jeanella Craze Wednesday Aug 12, 2015 @2 :30.  written and verbal instructions given. pt  verbalized understanding.    Continued Psychosocial Services Needed Yes      Psychosocial Re-Evaluation:   Vocational Rehabilitation: Provide vocational rehab assistance to qualifying candidates.   Vocational Rehab Evaluation & Intervention:     Vocational Rehab - 08/12/15 1733    Initial Vocational Rehab Evaluation & Intervention   Assessment shows need for Vocational Rehabilitation Yes  pt given voc rehab packet however has not returned for processing    [place] given to patient 07/24/15      Education: Education Goals: Education classes will be provided on a weekly basis, covering required topics. Participant will state understanding/return demonstration of topics presented.  Learning Barriers/Preferences:     Learning Barriers/Preferences - 07/16/15 0819    Learning Barriers/Preferences   Learning Barriers None   Learning Preferences Written Material      Education Topics: Count Your Pulse:  -Group instruction provided by verbal instruction, demonstration, patient participation and written materials to support subject.  Instructors address importance of being able to find your pulse and how to count your pulse when at home without a heart monitor.  Patients get hands on experience counting their pulse with staff help and individually.   Heart Attack, Angina, and Risk Factor Modification:  -Group instruction provided by verbal instruction, video, and written materials to support subject.  Instructors address signs and symptoms of angina and heart attacks.    Also discuss risk factors for heart disease and how to make changes to improve heart health risk factors.          CARDIAC REHAB PHASE II EXERCISE from 08/05/2015 in [facility]   Date  07/29/15   Instruction Review Code  2- meets goals/outcomes      Functional Fitness:  -Group instruction provided by verbal instruction, demonstration, patient participation, and  written materials to support subject.  Instructors address safety measures for doing things around the house.  Discuss how to get up and down off the floor, how to pick things up properly, how to safely get out of a chair without assistance, and balance training.   Meditation and Mindfulness:  -Group instruction provided by verbal instruction, patient participation, and written materials to support subject.  Instructor addresses importance of mindfulness and meditation practice to help reduce stress and improve awareness.  Instructor also leads participants through a meditation exercise.    Stretching for Flexibility and Mobility:  -Group instruction provided by verbal instruction, patient participation, and written materials to support subject.  Instructors lead participants through series of stretches that are designed to increase flexibility thus improving mobility.  These stretches are additional exercise for major muscle groups that are typically performed during regular warm up and cool down.   Hands Only CPR Anytime:  -Group instruction provided by verbal instruction, video, patient participation and written materials to support subject.  Instructors co-teach with [facility] video for hands only CPR.  Participants get hands on experience with mannequins.   Nutrition I class: Heart Healthy Eating:  -Group instruction provided by PowerPoint slides, verbal discussion, and written materials to support subject matter. The instructor gives an explanation and review of the Therapeutic Lifestyle Changes diet recommendations, which includes a discussion on lipid goals, dietary fat, sodium, fiber, plant stanol/sterol esters, sugar, and the components of a well-balanced, healthy diet.      CARDIAC REHAB PHASE II EXERCISE  from 08/05/2015 in [place]   Date  07/28/15   Educator  RD   Instruction Review Code  2- meets goals/outcomes      Nutrition II class: Lifestyle  Skills:  -Group instruction provided by PowerPoint slides, verbal discussion, and written materials to support subject matter. The instructor gives an explanation and review of label reading, grocery shopping for heart health, heart healthy recipe modifications, and ways to make healthier choices when eating out.      CARDIAC REHAB PHASE II EXERCISE from 08/05/2015 in [place]   Date  08/04/15   Educator  RD   Instruction Review Code  2- meets goals/outcomes      Diabetes Question & Answer:  -Group instruction provided by PowerPoint slides, verbal discussion, and written materials to support subject matter. The instructor gives an explanation and review of diabetes co-morbidities, pre- and post-prandial blood glucose goals, pre-exercise blood glucose goals, signs, symptoms, and treatment of hypoglycemia and hyperglycemia, and foot care basics.   Diabetes Blitz:  -Group instruction provided by PowerPoint slides, verbal discussion, and written materials to support subject matter. The instructor gives an explanation and review of the physiology behind type 1 and type 2 diabetes, diabetes medications and rational behind using different medications, pre- and post-prandial blood glucose recommendations and Hemoglobin A1c goals, diabetes diet, and exercise including blood glucose guidelines for exercising safely.    Portion Distortion:  -Group instruction provided by PowerPoint slides, verbal discussion, written materials, and food models to support subject matter. The instructor gives an explanation of serving size versus portion size, changes in portions sizes over the last 20 years, and what consists of a serving from each food group.      CARDIAC REHAB PHASE II EXERCISE from 08/05/2015 in [place]   Date  07/22/15   Educator  RD   Instruction Review Code  2- meets goals/outcomes      Stress Management:  -Group instruction  provided by verbal instruction, video, and written materials to support subject matter.  Instructors review role of stress in heart disease and how to cope with stress positively.     Exercising on Your Own:  -Group instruction provided by verbal instruction, power point, and written materials to support subject.  Instructors discuss benefits of exercise, components of exercise, frequency and intensity of exercise, and end points for exercise.  Also discuss use of nitroglycerin and activating EMS.  Review options of places to exercise outside of rehab.  Review guidelines for sex with heart disease.   Cardiac Drugs I:  -Group instruction provided by verbal instruction and written materials to support subject.  Instructor reviews cardiac drug classes: antiplatelets, anticoagulants, beta blockers, and statins.  Instructor discusses reasons, side effects, and lifestyle considerations for each drug class.   Cardiac Drugs II:  -Group instruction provided by verbal instruction and written materials to support subject.  Instructor reviews cardiac drug classes: angiotensin converting enzyme inhibitors (ACE-I), angiotensin II receptor blockers (ARBs), nitrates, and calcium channel blockers.  Instructor discusses reasons, side effects, and lifestyle considerations for each drug class.   Anatomy and Physiology of the Circulatory System:  -Group instruction provided by verbal instruction, video, and written materials to support subject.  Reviews functional anatomy of heart, how it relates to various diagnoses, and what role the heart plays in the overall system.   Knowledge Questionnaire Score:     Knowledge Questionnaire Score - 07/16/15 1150  Knowledge Questionnaire Score   Pre Score 23/28      Core Components/Risk Factors/Patient Goals at Admission:     Personal Goals and Risk Factors at Admission - 08/12/15 1506    Core Components/Risk Factors/Patient Goals on Admission    Weight Management  Yes   Increase Strength and Stamina Yes      Core Components/Risk Factors/Patient Goals Review:      Goals and Risk Factor Review      07/29/15 1424 08/12/15 1507         Core Components/Risk Factors/Patient Goals Review   Personal Goals Review Weight Management/Obesity (p) Weight Management/Obesity;Increase Strength and Stamina      Review see 07/29/15 RD note. Pt wt is up 3.5 kg (7.7 lb) (p) Pt weight is stable and strength is improving. pt have some reservations with the healing process of his heart      Expected Outcomes Prevent further wt gain / encourage 0.5-2 lb wt loss/week          Core Components/Risk Factors/Patient Goals at Discharge (Final Review):      Goals and Risk Factor Review - 08/12/15 1507    Core Components/Risk Factors/Patient Goals Review   Personal Goals Review (p) Weight Management/Obesity;Increase Strength and Stamina   Review (p) Pt weight is stable and strength is improving. pt have some reservations with the healing process of his heart      ITP Comments:     ITP Comments      07/16/15 0944           ITP Comments Dr. Fransico Him, MD, medical director          Comments: Pt is making expected progress toward personal goals after completing 11 sessions. Recommend continued exercise and life style modification education including  stress management and relaxation techniques to decrease cardiac risk profile. Pt with history of depression has made appointment with Jeanella Craze for counseling.  QOL faxed to pt PCP for review.

## 2015-08-14 ENCOUNTER — Encounter (HOSPITAL_COMMUNITY)
Admission: RE | Admit: 2015-08-14 | Discharge: 2015-08-14 | Disposition: A | Discharge status: Home / Self Care | Payer: BLUE CROSS/BLUE SHIELD | Source: Ambulatory Visit | Attending: Cardiology | Admitting: Cardiology

## 2015-08-14 ENCOUNTER — Encounter (HOSPITAL_COMMUNITY): Payer: BLUE CROSS/BLUE SHIELD

## 2015-08-14 DIAGNOSIS — Z951 Presence of aortocoronary bypass graft: Secondary | ICD-10-CM

## 2015-08-17 ENCOUNTER — Encounter (HOSPITAL_COMMUNITY)
Admission: RE | Admit: 2015-08-17 | Discharge: 2015-08-17 | Disposition: A | Discharge status: Home / Self Care | Payer: BLUE CROSS/BLUE SHIELD | Source: Ambulatory Visit | Attending: Cardiology | Admitting: Cardiology

## 2015-08-17 ENCOUNTER — Encounter (HOSPITAL_COMMUNITY): Payer: BLUE CROSS/BLUE SHIELD

## 2015-08-17 DIAGNOSIS — Z951 Presence of aortocoronary bypass graft: Secondary | ICD-10-CM | POA: Diagnosis not present

## 2015-08-17 LAB — CULT, FUNGUS, SKIN,HAIR,NAIL W/KOH

## 2015-08-17 LAB — FUNGUS CULTURE W SMEAR

## 2015-08-18 ENCOUNTER — Other Ambulatory Visit: Payer: Self-pay | Admitting: Medical

## 2015-08-18 MED ORDER — BUPROPION HCL ER (XL) 150 MG PO TB24: 150.0000 mg | ORAL_TABLET | Freq: Every day | ORAL | Status: DC

## 2015-08-19 ENCOUNTER — Encounter (HOSPITAL_COMMUNITY)
Admission: RE | Admit: 2015-08-19 | Discharge: 2015-08-19 | Disposition: A | Discharge status: Home / Self Care | Payer: BLUE CROSS/BLUE SHIELD | Source: Ambulatory Visit | Attending: Cardiology | Admitting: Cardiology

## 2015-08-19 ENCOUNTER — Encounter (HOSPITAL_COMMUNITY): Payer: BLUE CROSS/BLUE SHIELD

## 2015-08-19 DIAGNOSIS — Z951 Presence of aortocoronary bypass graft: Secondary | ICD-10-CM | POA: Diagnosis not present

## 2015-08-21 ENCOUNTER — Encounter (HOSPITAL_COMMUNITY): Payer: BLUE CROSS/BLUE SHIELD

## 2015-08-21 ENCOUNTER — Encounter (HOSPITAL_COMMUNITY)
Admission: RE | Admit: 2015-08-21 | Discharge: 2015-08-21 | Disposition: A | Discharge status: Home / Self Care | Payer: BLUE CROSS/BLUE SHIELD | Source: Ambulatory Visit | Attending: Cardiology | Admitting: Cardiology

## 2015-08-21 DIAGNOSIS — Z951 Presence of aortocoronary bypass graft: Secondary | ICD-10-CM | POA: Diagnosis not present

## 2015-08-24 ENCOUNTER — Encounter (HOSPITAL_COMMUNITY): Payer: BLUE CROSS/BLUE SHIELD

## 2015-08-25 ENCOUNTER — Other Ambulatory Visit: Payer: Self-pay

## 2015-08-25 DIAGNOSIS — R071 Chest pain on breathing: Secondary | ICD-10-CM

## 2015-08-25 DIAGNOSIS — Z951 Presence of aortocoronary bypass graft: Secondary | ICD-10-CM

## 2015-08-25 DIAGNOSIS — Z4802 Encounter for removal of sutures: Secondary | ICD-10-CM

## 2015-08-25 DIAGNOSIS — G8918 Other acute postprocedural pain: Secondary | ICD-10-CM

## 2015-08-25 MED ORDER — TRAMADOL HCL 50 MG PO TABS: 100.0000 mg | ORAL_TABLET | Freq: Two times a day (BID) | ORAL | Status: DC | PRN

## 2015-08-25 NOTE — Telephone Encounter (Signed)
RX  Refill faxed to  [facility] Tramadol

## 2015-08-26 ENCOUNTER — Encounter (HOSPITAL_COMMUNITY): Payer: BLUE CROSS/BLUE SHIELD

## 2015-08-26 ENCOUNTER — Ambulatory Visit
Admission: RE | Admit: 2015-08-26 | Discharge: 2015-08-26 | Disposition: A | Discharge status: Home / Self Care | Payer: BLUE CROSS/BLUE SHIELD | Source: Ambulatory Visit | Attending: Surgery | Admitting: Surgery

## 2015-08-26 ENCOUNTER — Encounter: Payer: Self-pay | Admitting: Surgery

## 2015-08-26 ENCOUNTER — Encounter (HOSPITAL_COMMUNITY)
Admission: RE | Admit: 2015-08-26 | Discharge: 2015-08-26 | Disposition: A | Discharge status: Home / Self Care | Payer: BLUE CROSS/BLUE SHIELD | Source: Ambulatory Visit | Attending: Cardiology | Admitting: Cardiology

## 2015-08-26 ENCOUNTER — Ambulatory Visit (INDEPENDENT_AMBULATORY_CARE_PROVIDER_SITE_OTHER): Payer: Self-pay | Admitting: Surgery

## 2015-08-26 VITALS — BP 129/92 | HR 97 | Resp 16 | Ht 69.0 in | Wt 195.0 lb

## 2015-08-26 DIAGNOSIS — R071 Chest pain on breathing: Secondary | ICD-10-CM

## 2015-08-26 DIAGNOSIS — Z951 Presence of aortocoronary bypass graft: Secondary | ICD-10-CM

## 2015-08-26 NOTE — Progress Notes (Signed)
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  I last saw him on 4/5 and he was doing well. He is about to go back to work and says that as he has been more active he has more chest wall pain from the surgery. He came back today to get some pain medication to take after work if he is sore so that he can sleep. He took minimal pain meds after going home from surgery and has taken nothing over the past two months.    Current Outpatient Prescriptions  Medication Sig Dispense Refill  . albuterol (PROVENTIL HFA;VENTOLIN HFA) 108 (90 Base) MCG/ACT inhaler Inhale 2 puffs into the lungs every 6 (six) hours as needed for wheezing or shortness of breath. 1 Inhaler 1  . aspirin 325 MG EC tablet Take 325 mg by mouth daily.    Marland Kitchen buPROPion (WELLBUTRIN XL) 150 MG 24 hr tablet Take 1 tablet (150 mg total) by mouth daily. 90 tablet 0  . lisinopril (PRINIVIL,ZESTRIL) 10 MG tablet Take 1 tablet (10 mg total) by mouth daily. 30 tablet 1  . Multiple Vitamin (MULTIVITAMIN WITH MINERALS) TABS tablet Take 1 tablet by mouth daily. Reported on 07/20/2015    . niacin 500 MG CR capsule Take 1 capsule (500 mg total) by mouth at bedtime. 90 capsule 3  . nicotine polacrilex (COMMIT) 4 MG lozenge Take 1 lozenge (4 mg total) by mouth as needed for smoking cessation. 100 tablet 0  . pantoprazole (PROTONIX) 40 MG tablet Take 1 tablet (40 mg total) by mouth daily before breakfast. Take 45 min prior to breakfast 90 tablet 1  . rosuvastatin (CRESTOR) 10 MG tablet Take 1 tablet (10 mg total) by mouth daily. 90 tablet 3  . traMADol (ULTRAM) 50 MG tablet Take 2 tablets (100 mg total) by mouth every 12 (twelve) hours as needed. 40 tablet 0  . oxyCODONE (OXY IR/ROXICODONE) 5 MG immediate release tablet Take 1-2 tablets (5-10 mg total) by mouth every 4 (four) hours as needed for severe pain. (Patient not taking: Reported on 07/20/2015) 50 tablet 0   No current  facility-administered medications for this visit.    Physical Exam: BP 129/92 mmHg  Pulse 97  Resp 16  Ht 5\' 9"  (1.753 m)  Wt 195 lb (88.451 kg)  BMI 28.78 kg/m2  SpO2 98% He looks well. Lung exam is clear. Cardiac exam shows a regular rate and rhythm with normal heart sounds. Chest incision is healing well and sternum is stable. The leg incisions are healing well and there is no peripheral edema.    Diagnostic Tests:  CLINICAL DATA: Follow-up CABG from March 2017.  EXAM: CHEST 2 VIEW  COMPARISON: PA and lateral chest x-ray of July 01, 2015  FINDINGS: The lungs are reasonably well inflated. There is stable scarring at the left lung base. There is no pneumothorax or pleural effusion. The sternal wires are intact. The retrosternal soft tissues are normal. The heart is normal in size. The pulmonary vascularity is not engorged. The trachea is midline.  IMPRESSION: There is no active cardiopulmonary disease.   Electronically Signed  By: David Martinique M.D.  On: 08/26/2015 13:41  Impression:  He has the expected postop chest wall soreness that frequently increases as the patient gets more active. He has been out pushing a lawnmower this week. I told him that I would not order narcotic pain meds this far out from surgery. I recommended using  ibuprofen prn for pain. He says that he does not take that due to reflux but I think he would probably be fine with it on a prn basis. He has never had any GI bleeding and his renal function is normal.  Plan:  He will try ibuprofen for pain as needed. If his pain does not resolve by 6 months postop he will return to see me. Occasionally patients have prolonged pain that is relieved by removing the sternal wires but that is unusual.    [name], MD [facility] [phone]

## 2015-08-28 ENCOUNTER — Encounter (HOSPITAL_COMMUNITY): Admission: RE | Admit: 2015-08-28 | Payer: BLUE CROSS/BLUE SHIELD | Source: Ambulatory Visit

## 2015-08-28 ENCOUNTER — Encounter (HOSPITAL_COMMUNITY)
Admission: RE | Admit: 2015-08-28 | Discharge: 2015-08-28 | Disposition: A | Discharge status: Home / Self Care | Payer: BLUE CROSS/BLUE SHIELD | Source: Ambulatory Visit | Attending: Cardiology | Admitting: Cardiology

## 2015-08-28 ENCOUNTER — Encounter (HOSPITAL_COMMUNITY): Payer: BLUE CROSS/BLUE SHIELD

## 2015-08-28 DIAGNOSIS — Z7982 Long term (current) use of aspirin: Secondary | ICD-10-CM | POA: Insufficient documentation

## 2015-08-28 DIAGNOSIS — I2510 Atherosclerotic heart disease of native coronary artery without angina pectoris: Secondary | ICD-10-CM | POA: Insufficient documentation

## 2015-08-28 DIAGNOSIS — K219 Gastro-esophageal reflux disease without esophagitis: Secondary | ICD-10-CM | POA: Insufficient documentation

## 2015-08-28 DIAGNOSIS — Z951 Presence of aortocoronary bypass graft: Secondary | ICD-10-CM

## 2015-08-28 DIAGNOSIS — E782 Mixed hyperlipidemia: Secondary | ICD-10-CM | POA: Diagnosis not present

## 2015-08-28 DIAGNOSIS — Z79899 Other long term (current) drug therapy: Secondary | ICD-10-CM | POA: Insufficient documentation

## 2015-08-28 DIAGNOSIS — F17210 Nicotine dependence, cigarettes, uncomplicated: Secondary | ICD-10-CM | POA: Insufficient documentation

## 2015-08-28 DIAGNOSIS — I10 Essential (primary) hypertension: Secondary | ICD-10-CM | POA: Insufficient documentation

## 2015-08-28 DIAGNOSIS — Z8249 Family history of ischemic heart disease and other diseases of the circulatory system: Secondary | ICD-10-CM | POA: Diagnosis not present

## 2015-08-28 DIAGNOSIS — Z7902 Long term (current) use of antithrombotics/antiplatelets: Secondary | ICD-10-CM | POA: Insufficient documentation

## 2015-08-31 ENCOUNTER — Encounter (HOSPITAL_COMMUNITY)
Admission: RE | Admit: 2015-08-31 | Discharge: 2015-08-31 | Disposition: A | Discharge status: Home / Self Care | Payer: BLUE CROSS/BLUE SHIELD | Source: Ambulatory Visit | Attending: Cardiology | Admitting: Cardiology

## 2015-08-31 ENCOUNTER — Encounter (HOSPITAL_COMMUNITY): Payer: BLUE CROSS/BLUE SHIELD

## 2015-08-31 DIAGNOSIS — Z951 Presence of aortocoronary bypass graft: Secondary | ICD-10-CM

## 2015-09-02 ENCOUNTER — Encounter (HOSPITAL_COMMUNITY)
Admission: RE | Admit: 2015-09-02 | Discharge: 2015-09-02 | Disposition: A | Discharge status: Home / Self Care | Payer: BLUE CROSS/BLUE SHIELD | Source: Ambulatory Visit | Attending: Cardiology | Admitting: Cardiology

## 2015-09-02 ENCOUNTER — Encounter (HOSPITAL_COMMUNITY): Payer: BLUE CROSS/BLUE SHIELD

## 2015-09-02 DIAGNOSIS — Z951 Presence of aortocoronary bypass graft: Secondary | ICD-10-CM

## 2015-09-04 ENCOUNTER — Encounter (HOSPITAL_COMMUNITY): Payer: BLUE CROSS/BLUE SHIELD

## 2015-09-04 ENCOUNTER — Encounter (HOSPITAL_COMMUNITY)
Admission: RE | Admit: 2015-09-04 | Discharge: 2015-09-04 | Disposition: A | Discharge status: Home / Self Care | Payer: BLUE CROSS/BLUE SHIELD | Source: Ambulatory Visit | Attending: Cardiology | Admitting: Cardiology

## 2015-09-04 DIAGNOSIS — Z951 Presence of aortocoronary bypass graft: Secondary | ICD-10-CM

## 2015-09-07 ENCOUNTER — Encounter (HOSPITAL_COMMUNITY)
Admission: RE | Admit: 2015-09-07 | Discharge: 2015-09-07 | Disposition: A | Discharge status: Home / Self Care | Payer: BLUE CROSS/BLUE SHIELD | Source: Ambulatory Visit | Attending: Cardiology | Admitting: Cardiology

## 2015-09-07 ENCOUNTER — Encounter (HOSPITAL_COMMUNITY): Payer: BLUE CROSS/BLUE SHIELD

## 2015-09-07 DIAGNOSIS — Z951 Presence of aortocoronary bypass graft: Secondary | ICD-10-CM

## 2015-09-09 ENCOUNTER — Encounter (HOSPITAL_COMMUNITY): Payer: BLUE CROSS/BLUE SHIELD

## 2015-09-09 ENCOUNTER — Encounter (HOSPITAL_COMMUNITY)
Admission: RE | Admit: 2015-09-09 | Discharge: 2015-09-09 | Disposition: A | Discharge status: Home / Self Care | Payer: BLUE CROSS/BLUE SHIELD | Source: Ambulatory Visit | Attending: Cardiology | Admitting: Cardiology

## 2015-09-09 DIAGNOSIS — Z951 Presence of aortocoronary bypass graft: Secondary | ICD-10-CM | POA: Diagnosis not present

## 2015-09-11 ENCOUNTER — Encounter (HOSPITAL_COMMUNITY): Payer: BLUE CROSS/BLUE SHIELD

## 2015-09-14 ENCOUNTER — Encounter (HOSPITAL_COMMUNITY): Payer: BLUE CROSS/BLUE SHIELD

## 2015-09-16 ENCOUNTER — Encounter (HOSPITAL_COMMUNITY): Payer: BLUE CROSS/BLUE SHIELD

## 2015-09-16 ENCOUNTER — Encounter (HOSPITAL_COMMUNITY)
Admission: RE | Admit: 2015-09-16 | Discharge: 2015-09-16 | Disposition: A | Discharge status: Home / Self Care | Payer: BLUE CROSS/BLUE SHIELD | Source: Ambulatory Visit | Attending: Cardiology | Admitting: Cardiology

## 2015-09-16 DIAGNOSIS — Z951 Presence of aortocoronary bypass graft: Secondary | ICD-10-CM

## 2015-09-18 ENCOUNTER — Encounter (HOSPITAL_COMMUNITY): Payer: BLUE CROSS/BLUE SHIELD

## 2015-09-18 ENCOUNTER — Encounter (HOSPITAL_COMMUNITY)
Admission: RE | Admit: 2015-09-18 | Discharge: 2015-09-18 | Disposition: A | Discharge status: Home / Self Care | Payer: BLUE CROSS/BLUE SHIELD | Source: Ambulatory Visit | Attending: Cardiology | Admitting: Cardiology

## 2015-09-18 DIAGNOSIS — Z951 Presence of aortocoronary bypass graft: Secondary | ICD-10-CM

## 2015-09-18 NOTE — Progress Notes (Signed)
Cardiac Individual Treatment Plan  Patient Details  Name: Donald Oliver MRN: [id number] Date of Birth: 05/20/1970 Referring Provider:        CARDIAC REHAB PHASE II ORIENTATION from 07/16/2015 in [facility]   Referring Provider  Dr. Tollie Eth, MD      Initial Encounter Date:       CARDIAC REHAB PHASE II ORIENTATION from 07/16/2015 in [facility]   Date  07/16/15   Referring Provider  Dr. Tollie Eth, MD      Visit Diagnosis: No diagnosis found.  Patient's Home Medications on Admission:  Current outpatient prescriptions:  .  albuterol (PROVENTIL HFA;VENTOLIN HFA) 108 (90 Base) MCG/ACT inhaler, Inhale 2 puffs into the lungs every 6 (six) hours as needed for wheezing or shortness of breath., Disp: 1 Inhaler, Rfl: 1 .  aspirin 325 MG EC tablet, Take 325 mg by mouth daily., Disp: , Rfl:  .  buPROPion (WELLBUTRIN XL) 150 MG 24 hr tablet, Take 1 tablet (150 mg total) by mouth daily., Disp: 90 tablet, Rfl: 0 .  lisinopril (PRINIVIL,ZESTRIL) 10 MG tablet, Take 1 tablet (10 mg total) by mouth daily., Disp: 30 tablet, Rfl: 1 .  Multiple Vitamin (MULTIVITAMIN WITH MINERALS) TABS tablet, Take 1 tablet by mouth daily. Reported on 07/20/2015, Disp: , Rfl:  .  niacin 500 MG CR capsule, Take 1 capsule (500 mg total) by mouth at bedtime., Disp: 90 capsule, Rfl: 3 .  nicotine polacrilex (COMMIT) 4 MG lozenge, Take 1 lozenge (4 mg total) by mouth as needed for smoking cessation., Disp: 100 tablet, Rfl: 0 .  oxyCODONE (OXY IR/ROXICODONE) 5 MG immediate release tablet, Take 1-2 tablets (5-10 mg total) by mouth every 4 (four) hours as needed for severe pain. (Patient not taking: Reported on 07/20/2015), Disp: 50 tablet, Rfl: 0 .  pantoprazole (PROTONIX) 40 MG tablet, Take 1 tablet (40 mg total) by mouth daily before breakfast. Take 45 min prior to breakfast, Disp: 90 tablet, Rfl: 1 .  rosuvastatin (CRESTOR) 10 MG tablet, Take 1 tablet (10 mg  total) by mouth daily., Disp: 90 tablet, Rfl: 3 .  traMADol (ULTRAM) 50 MG tablet, Take 2 tablets (100 mg total) by mouth every 12 (twelve) hours as needed., Disp: 40 tablet, Rfl: 0  Past Medical History: Past Medical History  Diagnosis Date  . FH: premature coronary heart disease     Father had MI at age 20   . History of substance abuse     Pt had abused amphetamines : quit in 2005  . Hypertension 2005  . GERD (gastroesophageal reflux disease)   . Combined hyperlipidemia   . Impaired fasting blood sugar 2016  . Onychomycosis   . CAD (coronary artery disease), native coronary artery 06/01/2015    Cath 3/3 80% LAD, 99% diag, 50% circ, RCA 100% with Lto R collaterals  CABG 06/02/15 Dr. Cyndia Bent with LIMA to LAD, free RIMA to Diag and SVG to RCA      Tobacco Use: History  Smoking status  . Light Tobacco Smoker -- 1.00 packs/day for 30 years  Smokeless tobacco  . Never Used    Comment: Uses nicotene losanges    Labs: Recent Review Flowsheet Data    Labs for ITP Cardiac and Pulmonary Rehab Latest Ref Rng 06/02/2015 06/02/2015 06/02/2015 06/03/2015 07/20/2015   Hemoglobin A1c <5.7 % - - - - 5.4   PHART 7.350 - 7.450 7.290(L) 7.325(L) - - -   PCO2ART 35.0 -  45.0 mmHg 41.1 46.1(H) - - -   HCO3 20.0 - 24.0 mEq/L 19.6(L) 23.8 - - -   TCO2 0 - 100 mmol/L 21 25 23 25  -   ACIDBASEDEF 0.0 - 2.0 mmol/L 6.0(H) 2.0 - - -   O2SAT - 86.0 89.0 - - -      Capillary Blood Glucose: Lab Results  Component Value Date   GLUCAP 88 06/06/2015   GLUCAP 96 06/06/2015   GLUCAP 92 06/05/2015   GLUCAP 91 06/05/2015   GLUCAP 98 06/05/2015     Exercise Target Goals:    Exercise Program Goal: Individual exercise prescription set with THRR, safety & activity barriers. Participant demonstrates ability to understand and report RPE using BORG scale, to self-measure pulse accurately, and to acknowledge the importance of the exercise prescription.  Exercise Prescription Goal: Starting with aerobic activity 30  plus minutes a day, 3 days per week for initial exercise prescription. Provide home exercise prescription and guidelines that participant acknowledges understanding prior to discharge.  Activity Barriers & Risk Stratification:     Activity Barriers & Cardiac Risk Stratification - 07/16/15 0819    Activity Barriers & Cardiac Risk Stratification   Activity Barriers None   Cardiac Risk Stratification High      6 Minute Walk:     6 Minute Walk      07/16/15 0946       6 Minute Walk   Phase Initial     Distance 1264 feet     Walk Time 6 minutes     # of Rest Breaks 0     MPH 2.4     METS 3.94     RPE 11     VO2 Peak 13.79     Symptoms No     Resting HR 83 bpm     Resting BP 128/80 mmHg     Max Ex. HR 86 bpm     Max Ex. BP 118/70 mmHg     2 Minute Post BP 102/70 mmHg        Initial Exercise Prescription:     Initial Exercise Prescription - 07/16/15 1100    Date of Initial Exercise RX and Referring Provider   Date 07/16/15   Referring Provider Dr. Tollie Eth, MD   Treadmill   MPH 2.4   Grade 0   Minutes 10   METs 2.8   Bike   Level 1   METs 2   NuStep   Level 3   Minutes 10   METs 2   Prescription Details   Frequency (times per week) 3   Duration Progress to 30 minutes of continuous aerobic without signs/symptoms of physical distress   Intensity   THRR 40-80% of Max Heartrate 70-140   Ratings of Perceived Exertion 11-13   Progression   Progression Continue to progress workloads to maintain intensity without signs/symptoms of physical distress.   Resistance Training   Training Prescription Yes   Weight 2lbs   Reps 10-12      Perform Capillary Blood Glucose checks as needed.  Exercise Prescription Changes:     Exercise Prescription Changes      08/12/15 1500 08/31/15 1700 09/09/15 1700       Exercise Review   Progression Yes Yes Yes     Response to Exercise   Blood Pressure (Admit) 122/82 mmHg 128/84 mmHg 138/92 mmHg     Blood Pressure  (Exercise) 164/80 mmHg 142/86 mmHg 144/84 mmHg     Blood Pressure (Exit)  104/60 mmHg 100/68 mmHg 120/70 mmHg     Heart Rate (Admit) 97 bpm 93 bpm 95 bpm     Heart Rate (Exercise) 131 bpm 146 bpm 119 bpm     Heart Rate (Exit) 95 bpm 93 bpm 94 bpm     Rating of Perceived Exertion (Exercise) 12 15 11      Intensity THRR unchanged THRR unchanged THRR unchanged     Progression   Progression Continue to progress workloads to maintain intensity without signs/symptoms of physical distress. Continue to progress workloads to maintain intensity without signs/symptoms of physical distress. Continue to progress workloads to maintain intensity without signs/symptoms of physical distress.     Average METs 4.8 4.8 4.8     Resistance Training   Training Prescription Yes Yes Yes     Weight 3lbs 5lbs 5lbs     Reps 10-12 10-12 10-12     Treadmill   MPH 3 3 3      Grade 2 2 2      Minutes 10 10 10      METs 4.12 4.12 4.12     Bike   Level 1.4 2 2      METs 4.03 5.3 5.3     NuStep   Level 3       Minutes 10       METs 2.6       Elliptical   Level  1 1     METs  4.6 4.6     Home Exercise Plan   Plans to continue exercise at  Home  reviewed HEP on 08/05/15 see progress note Home  reviewed HEP on 08/05/15 see progress note     Frequency  Add 3 additional days to program exercise sessions. Add 3 additional days to program exercise sessions.        Exercise Comments:     Exercise Comments      07/23/15 1118 08/12/15 1505 09/09/15 1725       Exercise Comments pt is tolerating exercises well, no changes are made to the current exercise rx, will continue to monitor exercise progression pt is tolerating exercise well, Will continue to monitor exercise progression Reviewed METs and goals. pt is responding to exercise well and will continue to monitor exercise progression        Discharge Exercise Prescription (Final Exercise Prescription Changes):     Exercise Prescription Changes - 09/09/15 1700     Exercise Review   Progression Yes   Response to Exercise   Blood Pressure (Admit) 138/92 mmHg   Blood Pressure (Exercise) 144/84 mmHg   Blood Pressure (Exit) 120/70 mmHg   Heart Rate (Admit) 95 bpm   Heart Rate (Exercise) 119 bpm   Heart Rate (Exit) 94 bpm   Rating of Perceived Exertion (Exercise) 11   Intensity THRR unchanged   Progression   Progression Continue to progress workloads to maintain intensity without signs/symptoms of physical distress.   Average METs 4.8   Resistance Training   Training Prescription Yes   Weight 5lbs   Reps 10-12   Treadmill   MPH 3   Grade 2   Minutes 10   METs 4.12   Bike   Level 2   METs 5.3   Elliptical   Level 1   METs 4.6   Home Exercise Plan   Plans to continue exercise at Home  reviewed HEP on 08/05/15 see progress note   Frequency Add 3 additional days to program exercise sessions.      Nutrition:  Target Goals: Understanding of nutrition guidelines, daily intake of sodium 1500mg , cholesterol 200mg , calories 30% from fat and 7% or less from saturated fats, daily to have 5 or more servings of fruits and vegetables.  Biometrics:     Pre Biometrics - 07/16/15 0952    Pre Biometrics   Waist Circumference 41.75 inches   Hip Circumference 41 inches   Waist to Hip Ratio 1.02 %   Triceps Skinfold 20 mm   % Body Fat 29.1 %   Grip Strength 40 kg   Flexibility 0 in   Single Leg Stand 30 seconds       Nutrition Therapy Plan and Nutrition Goals:     Nutrition Therapy & Goals - 07/16/15 1018    Nutrition Therapy   Diet Therapeutic Lifestyle Changes   Personal Nutrition Goals   Personal Goal #1 Wt loss of 0.5-2 lb to a goal wt loss of 6-15 lb at graduation from [facility], educate and counsel regarding individualized specific dietary modifications aiming towards targeted core components such as weight, hypertension, lipid management, diabetes, heart failure and other  comorbidities.   Expected Outcomes Short Term Goal: Understand basic principles of dietary content, such as calories, fat, sodium, cholesterol and nutrients.;Long Term Goal: Adherence to prescribed nutrition plan.      Nutrition Discharge: Nutrition Scores:     Nutrition Assessments - 07/29/15 1424    MEDFICTS Scores   Pre Score 64      Nutrition Goals Re-Evaluation:     Nutrition Goals Re-Evaluation      07/29/15 1426           Personal Goal #1 Re-Evaluation   Personal Goal #1 Wt loss of 0.5-2 lb to a goal wt loss of 6-15 lb at graduation from Cardiac Rehab       Goal Progress Seen No       Comments Pt wt is up 7.7 lb from admission. Encourage slow wt loss of 0.5-2 lb/wk.       Weight   Current Weight 189 lb 6.4 oz (85.911 kg)       Intervention Plan   Intervention Continue to educate, counsel and set short/long term goals regarding individualized specific personal dietary modifications.;Nutrition handout(s) given to patient.  5 day, 1800 kcal menu ideas given       Comments see 07/29/15 RD note          Psychosocial: Target Goals: Acknowledge presence or absence of depression, maximize coping skills, provide positive support system. Participant is able to verbalize types and ability to use techniques and skills needed for reducing stress and depression.  Initial Review & Psychosocial Screening:     Initial Psych Review & Screening - 07/20/15 1510    Initial Review   Current issues with Current Depression   Family Dynamics   Good Support System? Yes   Barriers   Psychosocial barriers to participate in program The patient should benefit from training in stress management and relaxation.   Screening Interventions   Interventions Encouraged to exercise      Quality of Life Scores:     Quality of Life - 07/29/15 1650    Quality of Life Scores   GLOBAL Pre --  see progress note      PHQ-9:     Recent Review Flowsheet Data    Depression screen [facility] 2/9  07/20/2015   Decreased Interest 0   Down, Depressed, Hopeless 3   PHQ -  2 Score 3   Altered sleeping 0   Tired, decreased energy 0   Change in appetite 0   Feeling bad or failure about yourself  1   Trouble concentrating 0   Moving slowly or fidgety/restless 0   Suicidal thoughts 0   PHQ-9 Score 4   Difficult doing work/chores Somewhat difficult      Psychosocial Evaluation and Intervention:     Psychosocial Evaluation - 09/18/15 1011    Psychosocial Evaluation & Interventions   Interventions Therapist referral   Comments pt outlook and mood improved with counselor appt and contiinued exercise with cardiac rehab. pt enjoys classmate interaction.     Continued Psychosocial Services Needed Yes      Psychosocial Re-Evaluation:     Psychosocial Re-Evaluation      09/18/15 1012           Psychosocial Re-Evaluation   Interventions Relaxation education;Stress management education;Encouraged to attend Cardiac Rehabilitation for the exercise       Continued Psychosocial Services Needed Yes          Vocational Rehabilitation: Provide vocational rehab assistance to qualifying candidates.   Vocational Rehab Evaluation & Intervention:     Vocational Rehab - 08/12/15 1733    Initial Vocational Rehab Evaluation & Intervention   Assessment shows need for Vocational Rehabilitation Yes  pt given voc rehab packet however has not returned for processing    [place] given to patient 07/24/15      Education: Education Goals: Education classes will be provided on a weekly basis, covering required topics. Participant will state understanding/return demonstration of topics presented.  Learning Barriers/Preferences:     Learning Barriers/Preferences - 07/16/15 0819    Learning Barriers/Preferences   Learning Barriers None   Learning Preferences Written Material      Education Topics: Count Your Pulse:  -Group instruction provided by verbal instruction,  demonstration, patient participation and written materials to support subject.  Instructors address importance of being able to find your pulse and how to count your pulse when at home without a heart monitor.  Patients get hands on experience counting their pulse with staff help and individually.          CARDIAC REHAB PHASE II EXERCISE from 09/16/2015 in [place] [facility]   Date  08/28/15   Instruction Review Code  2- meets goals/outcomes      Heart Attack, Angina, and Risk Factor Modification:  -Group instruction provided by verbal instruction, video, and written materials to support subject.  Instructors address signs and symptoms of angina and heart attacks.    Also discuss risk factors for heart disease and how to make changes to improve heart health risk factors.      CARDIAC REHAB PHASE II EXERCISE from 09/16/2015 in [place]   Date  07/29/15   Instruction Review Code  2- meets goals/outcomes      Functional Fitness:  -Group instruction provided by verbal instruction, demonstration, patient participation, and written materials to support subject.  Instructors address safety measures for doing things around the house.  Discuss how to get up and down off the floor, how to pick things up properly, how to safely get out of a chair without assistance, and balance training.      CARDIAC REHAB PHASE II EXERCISE from 09/16/2015 in [place]   Date  08/14/15   Educator  Raechel Ache, MA, ACSM CES  Meditation and Mindfulness:  -Group instruction provided by verbal instruction, patient participation, and written materials to support subject.  Instructor addresses importance of mindfulness and meditation practice to help reduce stress and improve awareness.  Instructor also leads participants through a meditation exercise.       CARDIAC REHAB PHASE II EXERCISE from 09/16/2015 in [place]   Date  08/19/15   Educator  [place]   Instruction Review Code  2- meets goals/outcomes      Stretching for Flexibility and Mobility:  -Group instruction provided by verbal instruction, patient participation, and written materials to support subject.  Instructors lead participants through series of stretches that are designed to increase flexibility thus improving mobility.  These stretches are additional exercise for major muscle groups that are typically performed during regular warm up and cool down.      CARDIAC REHAB PHASE II EXERCISE from 09/16/2015 in [facility]   Date  09/16/15   Educator  Luetta Nutting Fair,MS,ACSM [place] Only CPR Anytime:  -Group instruction provided by verbal instruction, video, patient participation and written materials to support subject.  Instructors co-teach with AHA video for hands only CPR.  Participants get hands on experience with mannequins.   Nutrition I class: Heart Healthy Eating:  -Group instruction provided by PowerPoint slides, verbal discussion, and written materials to support subject matter. The instructor gives an explanation and review of the Therapeutic Lifestyle Changes diet recommendations, which includes a discussion on lipid goals, dietary fat, sodium, fiber, plant stanol/sterol esters, sugar, and the components of a well-balanced, healthy diet.      CARDIAC REHAB PHASE II EXERCISE from 09/16/2015 in [place]   Date  07/28/15   Educator  RD   Instruction Review Code  2- meets goals/outcomes      Nutrition II class: Lifestyle Skills:  -Group instruction provided by PowerPoint slides, verbal discussion, and written materials to support subject matter. The instructor gives an explanation and review of label reading, grocery shopping for heart health, heart healthy recipe modifications, and ways to make healthier choices when eating out.       CARDIAC REHAB PHASE II EXERCISE from 09/16/2015 in [place]   Date  08/04/15   Educator  RD   Instruction Review Code  2- meets goals/outcomes      Diabetes Question & Answer:  -Group instruction provided by PowerPoint slides, verbal discussion, and written materials to support subject matter. The instructor gives an explanation and review of diabetes co-morbidities, pre- and post-prandial blood glucose goals, pre-exercise blood glucose goals, signs, symptoms, and treatment of hypoglycemia and hyperglycemia, and foot care basics.   Diabetes Blitz:  -Group instruction provided by PowerPoint slides, verbal discussion, and written materials to support subject matter. The instructor gives an explanation and review of the physiology behind type 1 and type 2 diabetes, diabetes medications and rational behind using different medications, pre- and post-prandial blood glucose recommendations and Hemoglobin A1c goals, diabetes diet, and exercise including blood glucose guidelines for exercising safely.    Portion Distortion:  -Group instruction provided by PowerPoint slides, verbal discussion, written materials, and food models to support subject matter. The instructor gives an explanation of serving size versus portion size, changes in portions sizes over the last 20 years, and what consists of a serving from each food group.      CARDIAC REHAB PHASE II EXERCISE from  09/16/2015 in [place]   Date  07/22/15   Educator  RD   Instruction Review Code  2- meets goals/outcomes      Stress Management:  -Group instruction provided by verbal instruction, video, and written materials to support subject matter.  Instructors review role of stress in heart disease and how to cope with stress positively.        CARDIAC REHAB PHASE II EXERCISE from 09/16/2015 in [place]   Date  09/02/15   Instruction Review Code   2- meets goals/outcomes      Exercising on Your Own:  -Group instruction provided by verbal instruction, power point, and written materials to support subject.  Instructors discuss benefits of exercise, components of exercise, frequency and intensity of exercise, and end points for exercise.  Also discuss use of nitroglycerin and activating EMS.  Review options of places to exercise outside of rehab.  Review guidelines for sex with heart disease.      CARDIAC REHAB PHASE II EXERCISE from 09/16/2015 in [place]   Date  08/26/15   Educator  Seward Carol, MS,ACSM CCES   Instruction Review Code  2- meets goals/outcomes      Cardiac Drugs I:  -Group instruction provided by verbal instruction and written materials to support subject.  Instructor reviews cardiac drug classes: antiplatelets, anticoagulants, beta blockers, and statins.  Instructor discusses reasons, side effects, and lifestyle considerations for each drug class.      CARDIAC REHAB PHASE II EXERCISE from 09/16/2015 in [place]   Date  09/09/15   Educator  Pharm D   Instruction Review Code  2- meets goals/outcomes      Cardiac Drugs II:  -Group instruction provided by verbal instruction and written materials to support subject.  Instructor reviews cardiac drug classes: angiotensin converting enzyme inhibitors (ACE-I), angiotensin II receptor blockers (ARBs), nitrates, and calcium channel blockers.  Instructor discusses reasons, side effects, and lifestyle considerations for each drug class.   Anatomy and Physiology of the Circulatory System:  -Group instruction provided by verbal instruction, video, and written materials to support subject.  Reviews functional anatomy of heart, how it relates to various diagnoses, and what role the heart plays in the overall system.   Knowledge Questionnaire Score:     Knowledge Questionnaire Score - 07/16/15 1150     Knowledge Questionnaire Score   Pre Score 23/28      Core Components/Risk Factors/Patient Goals at Admission:     Personal Goals and Risk Factors at Admission - 08/12/15 1506    Core Components/Risk Factors/Patient Goals on Admission    Weight Management Yes   Increase Strength and Stamina Yes      Core Components/Risk Factors/Patient Goals Review:      Goals and Risk Factor Review      07/29/15 1424 08/12/15 1507 09/09/15 1726       Core Components/Risk Factors/Patient Goals Review   Personal Goals Review Weight Management/Obesity (p) Weight Management/Obesity;Increase Strength and Stamina Other;Improve shortness of breath with ADL's     Review see 07/29/15 RD note. Pt wt is up 3.5 kg (7.7 lb) (p) Pt weight is stable and strength is improving. pt have some reservations with the healing process of his heart Pt is walking at home 3x/week in addition to CR. pt has noticed that SOB has improved, but notices that he forgets to breath     Expected Outcomes Prevent  further wt gain / encourage 0.5-2 lb wt loss/week  Discussed temperature precautions and hydration. Pt will be mindful of weather, exercise 3x/week in addition to CR and perform ADL's with little to no SOB        Core Components/Risk Factors/Patient Goals at Discharge (Final Review):      Goals and Risk Factor Review - 09/09/15 1726    Core Components/Risk Factors/Patient Goals Review   Personal Goals Review Other;Improve shortness of breath with ADL's   Review Pt is walking at home 3x/week in addition to CR. pt has noticed that SOB has improved, but notices that he forgets to breath   Expected Outcomes Discussed temperature precautions and hydration. Pt will be mindful of weather, exercise 3x/week in addition to CR and perform ADL's with little to no SOB      ITP Comments:     ITP Comments      07/16/15 0944           ITP Comments Dr. Fransico Him, MD, medical director          Comments:  Pt is making  expected progress toward personal goals after completing  24 sessions. Recommend continued exercise and life style modification education including  stress management and relaxation techniques to decrease cardiac risk profile.

## 2015-09-18 NOTE — Progress Notes (Signed)
Cardiac Individual Treatment Plan  Patient Details  Name: Donald Oliver MRN: [id number] Date of Birth: [birth year] Referring Provider:        CARDIAC REHAB PHASE II ORIENTATION from 07/16/2015 in [facility]   Referring Provider  Dr. Tollie Eth, MD      Initial Encounter Date:       CARDIAC REHAB PHASE II ORIENTATION from 07/16/2015 in [facility]   Date  07/16/15   Referring Provider  Dr. Tollie Eth, MD      Visit Diagnosis: No diagnosis found.  Patient's Home Medications on Admission:  Current outpatient prescriptions:  .  albuterol (PROVENTIL HFA;VENTOLIN HFA) 108 (90 Base) MCG/ACT inhaler, Inhale 2 puffs into the lungs every 6 (six) hours as needed for wheezing or shortness of breath., Disp: 1 Inhaler, Rfl: 1 .  aspirin 325 MG EC tablet, Take 325 mg by mouth daily., Disp: , Rfl:  .  buPROPion (WELLBUTRIN XL) 150 MG 24 hr tablet, Take 1 tablet (150 mg total) by mouth daily., Disp: 90 tablet, Rfl: 0 .  lisinopril (PRINIVIL,ZESTRIL) 10 MG tablet, Take 1 tablet (10 mg total) by mouth daily., Disp: 30 tablet, Rfl: 1 .  Multiple Vitamin (MULTIVITAMIN WITH MINERALS) TABS tablet, Take 1 tablet by mouth daily. Reported on 07/20/2015, Disp: , Rfl:  .  niacin 500 MG CR capsule, Take 1 capsule (500 mg total) by mouth at bedtime., Disp: 90 capsule, Rfl: 3 .  nicotine polacrilex (COMMIT) 4 MG lozenge, Take 1 lozenge (4 mg total) by mouth as needed for smoking cessation., Disp: 100 tablet, Rfl: 0 .  oxyCODONE (OXY IR/ROXICODONE) 5 MG immediate release tablet, Take 1-2 tablets (5-10 mg total) by mouth every 4 (four) hours as needed for severe pain. (Patient not taking: Reported on 07/20/2015), Disp: 50 tablet, Rfl: 0 .  pantoprazole (PROTONIX) 40 MG tablet, Take 1 tablet (40 mg total) by mouth daily before breakfast. Take 45 min prior to breakfast, Disp: 90 tablet, Rfl: 1 .  rosuvastatin (CRESTOR) 10 MG tablet, Take 1 tablet (10 mg  total) by mouth daily., Disp: 90 tablet, Rfl: 3 .  traMADol (ULTRAM) 50 MG tablet, Take 2 tablets (100 mg total) by mouth every 12 (twelve) hours as needed., Disp: 40 tablet, Rfl: 0  Past Medical History: Past Medical History  Diagnosis Date  . FH: premature coronary heart disease     Father had MI at age 71   . History of substance abuse     Pt had abused amphetamines : quit in 2005  . Hypertension 2005  . GERD (gastroesophageal reflux disease)   . Combined hyperlipidemia   . Impaired fasting blood sugar 2016  . Onychomycosis   . CAD (coronary artery disease), native coronary artery 06/01/2015    Cath 3/3 80% LAD, 99% diag, 50% circ, RCA 100% with Lto R collaterals  CABG 06/02/15 Dr. Cyndia Bent with LIMA to LAD, free RIMA to Diag and SVG to RCA      Tobacco Use: History  Smoking status  . Light Tobacco Smoker -- 1.00 packs/day for 30 years  Smokeless tobacco  . Never Used    Comment: Uses nicotene losanges    Labs: Recent Review Flowsheet Data    Labs for ITP Cardiac and Pulmonary Rehab Latest Ref Rng 06/02/2015 06/02/2015 06/02/2015 06/03/2015 07/20/2015   Hemoglobin A1c <5.7 % - - - - 5.4   PHART 7.350 - 7.450 7.290(L) 7.325(L) - - -   PCO2ART 35.0 -  45.0 mmHg 41.1 46.1(H) - - -   HCO3 20.0 - 24.0 mEq/L 19.6(L) 23.8 - - -   TCO2 0 - 100 mmol/L 21 25 23 25  -   ACIDBASEDEF 0.0 - 2.0 mmol/L 6.0(H) 2.0 - - -   O2SAT - 86.0 89.0 - - -      Capillary Blood Glucose: Lab Results  Component Value Date   GLUCAP 88 06/06/2015   GLUCAP 96 06/06/2015   GLUCAP 92 06/05/2015   GLUCAP 91 06/05/2015   GLUCAP 98 06/05/2015     Exercise Target Goals:    Exercise Program Goal: Individual exercise prescription set with THRR, safety & activity barriers. Participant demonstrates ability to understand and report RPE using BORG scale, to self-measure pulse accurately, and to acknowledge the importance of the exercise prescription.  Exercise Prescription Goal: Starting with aerobic activity 30  plus minutes a day, 3 days per week for initial exercise prescription. Provide home exercise prescription and guidelines that participant acknowledges understanding prior to discharge.  Activity Barriers & Risk Stratification:     Activity Barriers & Cardiac Risk Stratification - 07/16/15 0819    Activity Barriers & Cardiac Risk Stratification   Activity Barriers None   Cardiac Risk Stratification High      6 Minute Walk:     6 Minute Walk      07/16/15 0946       6 Minute Walk   Phase Initial     Distance 1264 feet     Walk Time 6 minutes     # of Rest Breaks 0     MPH 2.4     METS 3.94     RPE 11     VO2 Peak 13.79     Symptoms No     Resting HR 83 bpm     Resting BP 128/80 mmHg     Max Ex. HR 86 bpm     Max Ex. BP 118/70 mmHg     2 Minute Post BP 102/70 mmHg        Initial Exercise Prescription:     Initial Exercise Prescription - 07/16/15 1100    Date of Initial Exercise RX and Referring Provider   Date 07/16/15   Referring Provider Dr. Tollie Eth, MD   Treadmill   MPH 2.4   Grade 0   Minutes 10   METs 2.8   Bike   Level 1   METs 2   NuStep   Level 3   Minutes 10   METs 2   Prescription Details   Frequency (times per week) 3   Duration Progress to 30 minutes of continuous aerobic without signs/symptoms of physical distress   Intensity   THRR 40-80% of Max Heartrate 70-140   Ratings of Perceived Exertion 11-13   Progression   Progression Continue to progress workloads to maintain intensity without signs/symptoms of physical distress.   Resistance Training   Training Prescription Yes   Weight 2lbs   Reps 10-12      Perform Capillary Blood Glucose checks as needed.  Exercise Prescription Changes:     Exercise Prescription Changes      08/12/15 1500 08/31/15 1700 09/09/15 1700       Exercise Review   Progression Yes Yes Yes     Response to Exercise   Blood Pressure (Admit) 122/82 mmHg 128/84 mmHg 138/92 mmHg     Blood Pressure  (Exercise) 164/80 mmHg 142/86 mmHg 144/84 mmHg     Blood Pressure (Exit)  104/60 mmHg 100/68 mmHg 120/70 mmHg     Heart Rate (Admit) 97 bpm 93 bpm 95 bpm     Heart Rate (Exercise) 131 bpm 146 bpm 119 bpm     Heart Rate (Exit) 95 bpm 93 bpm 94 bpm     Rating of Perceived Exertion (Exercise) 12 15 11      Intensity THRR unchanged THRR unchanged THRR unchanged     Progression   Progression Continue to progress workloads to maintain intensity without signs/symptoms of physical distress. Continue to progress workloads to maintain intensity without signs/symptoms of physical distress. Continue to progress workloads to maintain intensity without signs/symptoms of physical distress.     Average METs 4.8 4.8 4.8     Resistance Training   Training Prescription Yes Yes Yes     Weight 3lbs 5lbs 5lbs     Reps 10-12 10-12 10-12     Treadmill   MPH 3 3 3      Grade 2 2 2      Minutes 10 10 10      METs 4.12 4.12 4.12     Bike   Level 1.4 2 2      METs 4.03 5.3 5.3     NuStep   Level 3       Minutes 10       METs 2.6       Elliptical   Level  1 1     METs  4.6 4.6     Home Exercise Plan   Plans to continue exercise at  Home  reviewed HEP on 08/05/15 see progress note Home  reviewed HEP on 08/05/15 see progress note     Frequency  Add 3 additional days to program exercise sessions. Add 3 additional days to program exercise sessions.        Exercise Comments:     Exercise Comments      07/23/15 1118 08/12/15 1505 09/09/15 1725       Exercise Comments pt is tolerating exercises well, no changes are made to the current exercise rx, will continue to monitor exercise progression pt is tolerating exercise well, Will continue to monitor exercise progression Reviewed METs and goals. pt is responding to exercise well and will continue to monitor exercise progression        Discharge Exercise Prescription (Final Exercise Prescription Changes):     Exercise Prescription Changes - 09/09/15 1700     Exercise Review   Progression Yes   Response to Exercise   Blood Pressure (Admit) 138/92 mmHg   Blood Pressure (Exercise) 144/84 mmHg   Blood Pressure (Exit) 120/70 mmHg   Heart Rate (Admit) 95 bpm   Heart Rate (Exercise) 119 bpm   Heart Rate (Exit) 94 bpm   Rating of Perceived Exertion (Exercise) 11   Intensity THRR unchanged   Progression   Progression Continue to progress workloads to maintain intensity without signs/symptoms of physical distress.   Average METs 4.8   Resistance Training   Training Prescription Yes   Weight 5lbs   Reps 10-12   Treadmill   MPH 3   Grade 2   Minutes 10   METs 4.12   Bike   Level 2   METs 5.3   Elliptical   Level 1   METs 4.6   Home Exercise Plan   Plans to continue exercise at Home  reviewed HEP on 08/05/15 see progress note   Frequency Add 3 additional days to program exercise sessions.      Nutrition:  Target Goals: Understanding of nutrition guidelines, daily intake of sodium 1500mg , cholesterol 200mg , calories 30% from fat and 7% or less from saturated fats, daily to have 5 or more servings of fruits and vegetables.  Biometrics:     Pre Biometrics - 07/16/15 0952    Pre Biometrics   Waist Circumference 41.75 inches   Hip Circumference 41 inches   Waist to Hip Ratio 1.02 %   Triceps Skinfold 20 mm   % Body Fat 29.1 %   Grip Strength 40 kg   Flexibility 0 in   Single Leg Stand 30 seconds       Nutrition Therapy Plan and Nutrition Goals:     Nutrition Therapy & Goals - 07/16/15 1018    Nutrition Therapy   Diet Therapeutic Lifestyle Changes   Personal Nutrition Goals   Personal Goal #1 Wt loss of 0.5-2 lb to a goal wt loss of 6-15 lb at graduation from [facility], educate and counsel regarding individualized specific dietary modifications aiming towards targeted core components such as weight, hypertension, lipid management, diabetes, heart failure and other  comorbidities.   Expected Outcomes Short Term Goal: Understand basic principles of dietary content, such as calories, fat, sodium, cholesterol and nutrients.;Long Term Goal: Adherence to prescribed nutrition plan.      Nutrition Discharge: Nutrition Scores:     Nutrition Assessments - 07/29/15 1424    MEDFICTS Scores   Pre Score 64      Nutrition Goals Re-Evaluation:     Nutrition Goals Re-Evaluation      07/29/15 1426           Personal Goal #1 Re-Evaluation   Personal Goal #1 Wt loss of 0.5-2 lb to a goal wt loss of 6-15 lb at graduation from Cardiac Rehab       Goal Progress Seen No       Comments Pt wt is up 7.7 lb from admission. Encourage slow wt loss of 0.5-2 lb/wk.       Weight   Current Weight 189 lb 6.4 oz (85.911 kg)       Intervention Plan   Intervention Continue to educate, counsel and set short/long term goals regarding individualized specific personal dietary modifications.;Nutrition handout(s) given to patient.  5 day, 1800 kcal menu ideas given       Comments see 07/29/15 RD note          Psychosocial: Target Goals: Acknowledge presence or absence of depression, maximize coping skills, provide positive support system. Participant is able to verbalize types and ability to use techniques and skills needed for reducing stress and depression.  Initial Review & Psychosocial Screening:     Initial Psych Review & Screening - 07/20/15 1510    Initial Review   Current issues with Current Depression   Family Dynamics   Good Support System? Yes   Barriers   Psychosocial barriers to participate in program The patient should benefit from training in stress management and relaxation.   Screening Interventions   Interventions Encouraged to exercise      Quality of Life Scores:     Quality of Life - 07/29/15 1650    Quality of Life Scores   GLOBAL Pre --  see progress note      PHQ-9:     Recent Review Flowsheet Data    Depression screen [facility] 2/9  07/20/2015   Decreased Interest 0   Down, Depressed, Hopeless 3   PHQ -  2 Score 3   Altered sleeping 0   Tired, decreased energy 0   Change in appetite 0   Feeling bad or failure about yourself  1   Trouble concentrating 0   Moving slowly or fidgety/restless 0   Suicidal thoughts 0   PHQ-9 Score 4   Difficult doing work/chores Somewhat difficult      Psychosocial Evaluation and Intervention:     Psychosocial Evaluation - 09/18/15 1011    Psychosocial Evaluation & Interventions   Interventions Therapist referral   Comments pt outlook and mood improved with counselor appt and contiinued exercise with cardiac rehab. pt enjoys classmate interaction.     Continued Psychosocial Services Needed Yes      Psychosocial Re-Evaluation:     Psychosocial Re-Evaluation      09/18/15 1012           Psychosocial Re-Evaluation   Interventions Relaxation education;Stress management education;Encouraged to attend Cardiac Rehabilitation for the exercise       Continued Psychosocial Services Needed Yes          Vocational Rehabilitation: Provide vocational rehab assistance to qualifying candidates.   Vocational Rehab Evaluation & Intervention:     Vocational Rehab - 08/12/15 1733    Initial Vocational Rehab Evaluation & Intervention   Assessment shows need for Vocational Rehabilitation Yes  pt given voc rehab packet however has not returned for processing    [place] given to patient 07/24/15      Education: Education Goals: Education classes will be provided on a weekly basis, covering required topics. Participant will state understanding/return demonstration of topics presented.  Learning Barriers/Preferences:     Learning Barriers/Preferences - 07/16/15 0819    Learning Barriers/Preferences   Learning Barriers None   Learning Preferences Written Material      Education Topics: Count Your Pulse:  -Group instruction provided by verbal instruction,  demonstration, patient participation and written materials to support subject.  Instructors address importance of being able to find your pulse and how to count your pulse when at home without a heart monitor.  Patients get hands on experience counting their pulse with staff help and individually.          CARDIAC REHAB PHASE II EXERCISE from 09/16/2015 in [facility]   Date  08/28/15   Instruction Review Code  2- meets goals/outcomes      Heart Attack, Angina, and Risk Factor Modification:  -Group instruction provided by verbal instruction, video, and written materials to support subject.  Instructors address signs and symptoms of angina and heart attacks.    Also discuss risk factors for heart disease and how to make changes to improve heart health risk factors.      CARDIAC REHAB PHASE II EXERCISE from 09/16/2015 in [facility]   Date  07/29/15   Instruction Review Code  2- meets goals/outcomes      Functional Fitness:  -Group instruction provided by verbal instruction, demonstration, patient participation, and written materials to support subject.  Instructors address safety measures for doing things around the house.  Discuss how to get up and down off the floor, how to pick things up properly, how to safely get out of a chair without assistance, and balance training.      CARDIAC REHAB PHASE II EXERCISE from 09/16/2015 in [facility]   Date  08/14/15   Educator  Raechel Ache, MA, ACSM CES  Meditation and Mindfulness:  -Group instruction provided by verbal instruction, patient participation, and written materials to support subject.  Instructor addresses importance of mindfulness and meditation practice to help reduce stress and improve awareness.  Instructor also leads participants through a meditation exercise.       CARDIAC REHAB PHASE II EXERCISE from 09/16/2015 in [facility]   Date  08/19/15   Educator  [place]   Instruction Review Code  2- meets goals/outcomes      Stretching for Flexibility and Mobility:  -Group instruction provided by verbal instruction, patient participation, and written materials to support subject.  Instructors lead participants through series of stretches that are designed to increase flexibility thus improving mobility.  These stretches are additional exercise for major muscle groups that are typically performed during regular warm up and cool down.      CARDIAC REHAB PHASE II EXERCISE from 09/16/2015 in [facility]   Date  09/16/15   Educator  Luetta Nutting Fair,MS,ACSM [place] Only CPR Anytime:  -Group instruction provided by verbal instruction, video, patient participation and written materials to support subject.  Instructors co-teach with AHA video for hands only CPR.  Participants get hands on experience with mannequins.   Nutrition I class: Heart Healthy Eating:  -Group instruction provided by PowerPoint slides, verbal discussion, and written materials to support subject matter. The instructor gives an explanation and review of the Therapeutic Lifestyle Changes diet recommendations, which includes a discussion on lipid goals, dietary fat, sodium, fiber, plant stanol/sterol esters, sugar, and the components of a well-balanced, healthy diet.      CARDIAC REHAB PHASE II EXERCISE from 09/16/2015 in [facility]   Date  07/28/15   Educator  RD   Instruction Review Code  2- meets goals/outcomes      Nutrition II class: Lifestyle Skills:  -Group instruction provided by PowerPoint slides, verbal discussion, and written materials to support subject matter. The instructor gives an explanation and review of label reading, grocery shopping for heart health, heart healthy recipe modifications, and ways to make healthier choices when eating out.       CARDIAC REHAB PHASE II EXERCISE from 09/16/2015 in [facility]   Date  08/04/15   Educator  RD   Instruction Review Code  2- meets goals/outcomes      Diabetes Question & Answer:  -Group instruction provided by PowerPoint slides, verbal discussion, and written materials to support subject matter. The instructor gives an explanation and review of diabetes co-morbidities, pre- and post-prandial blood glucose goals, pre-exercise blood glucose goals, signs, symptoms, and treatment of hypoglycemia and hyperglycemia, and foot care basics.   Diabetes Blitz:  -Group instruction provided by PowerPoint slides, verbal discussion, and written materials to support subject matter. The instructor gives an explanation and review of the physiology behind type 1 and type 2 diabetes, diabetes medications and rational behind using different medications, pre- and post-prandial blood glucose recommendations and Hemoglobin A1c goals, diabetes diet, and exercise including blood glucose guidelines for exercising safely.    Portion Distortion:  -Group instruction provided by PowerPoint slides, verbal discussion, written materials, and food models to support subject matter. The instructor gives an explanation of serving size versus portion size, changes in portions sizes over the last 20 years, and what consists of a serving from each food group.      CARDIAC REHAB PHASE II EXERCISE from  09/16/2015 in [facility]   Date  07/22/15   Educator  RD   Instruction Review Code  2- meets goals/outcomes      Stress Management:  -Group instruction provided by verbal instruction, video, and written materials to support subject matter.  Instructors review role of stress in heart disease and how to cope with stress positively.        CARDIAC REHAB PHASE II EXERCISE from 09/16/2015 in [place] [facility]   Date  09/02/15   Instruction Review Code   2- meets goals/outcomes      Exercising on Your Own:  -Group instruction provided by verbal instruction, power point, and written materials to support subject.  Instructors discuss benefits of exercise, components of exercise, frequency and intensity of exercise, and end points for exercise.  Also discuss use of nitroglycerin and activating EMS.  Review options of places to exercise outside of rehab.  Review guidelines for sex with heart disease.      CARDIAC REHAB PHASE II EXERCISE from 09/16/2015 in [place]   Date  08/26/15   Educator  Seward Carol, MS,ACSM CCES   Instruction Review Code  2- meets goals/outcomes      Cardiac Drugs I:  -Group instruction provided by verbal instruction and written materials to support subject.  Instructor reviews cardiac drug classes: antiplatelets, anticoagulants, beta blockers, and statins.  Instructor discusses reasons, side effects, and lifestyle considerations for each drug class.      CARDIAC REHAB PHASE II EXERCISE from 09/16/2015 in [place]   Date  09/09/15   Educator  Pharm D   Instruction Review Code  2- meets goals/outcomes      Cardiac Drugs II:  -Group instruction provided by verbal instruction and written materials to support subject.  Instructor reviews cardiac drug classes: angiotensin converting enzyme inhibitors (ACE-I), angiotensin II receptor blockers (ARBs), nitrates, and calcium channel blockers.  Instructor discusses reasons, side effects, and lifestyle considerations for each drug class.   Anatomy and Physiology of the Circulatory System:  -Group instruction provided by verbal instruction, video, and written materials to support subject.  Reviews functional anatomy of heart, how it relates to various diagnoses, and what role the heart plays in the overall system.   Knowledge Questionnaire Score:     Knowledge Questionnaire Score - 07/16/15 1150     Knowledge Questionnaire Score   Pre Score 23/28      Core Components/Risk Factors/Patient Goals at Admission:     Personal Goals and Risk Factors at Admission - 08/12/15 1506    Core Components/Risk Factors/Patient Goals on Admission    Weight Management Yes   Increase Strength and Stamina Yes      Core Components/Risk Factors/Patient Goals Review:      Goals and Risk Factor Review      07/29/15 1424 08/12/15 1507 09/09/15 1726       Core Components/Risk Factors/Patient Goals Review   Personal Goals Review Weight Management/Obesity (p) Weight Management/Obesity;Increase Strength and Stamina Other;Improve shortness of breath with ADL's     Review see 07/29/15 RD note. Pt wt is up 3.5 kg (7.7 lb) (p) Pt weight is stable and strength is improving. pt have some reservations with the healing process of his heart Pt is walking at home 3x/week in addition to CR. pt has noticed that SOB has improved, but notices that he forgets to breath     Expected Outcomes Prevent  further wt gain / encourage 0.5-2 lb wt loss/week  Discussed temperature precautions and hydration. Pt will be mindful of weather, exercise 3x/week in addition to CR and perform ADL's with little to no SOB        Core Components/Risk Factors/Patient Goals at Discharge (Final Review):      Goals and Risk Factor Review - 09/09/15 1726    Core Components/Risk Factors/Patient Goals Review   Personal Goals Review Other;Improve shortness of breath with ADL's   Review Pt is walking at home 3x/week in addition to CR. pt has noticed that SOB has improved, but notices that he forgets to breath   Expected Outcomes Discussed temperature precautions and hydration. Pt will be mindful of weather, exercise 3x/week in addition to CR and perform ADL's with little to no SOB      ITP Comments:     ITP Comments      07/16/15 0944           ITP Comments Dr. Fransico Him, MD, medical director          Comments:

## 2015-09-21 ENCOUNTER — Encounter (HOSPITAL_COMMUNITY)
Admission: RE | Admit: 2015-09-21 | Discharge: 2015-09-21 | Disposition: A | Discharge status: Home / Self Care | Payer: BLUE CROSS/BLUE SHIELD | Source: Ambulatory Visit | Attending: Cardiology | Admitting: Cardiology

## 2015-09-21 ENCOUNTER — Encounter (HOSPITAL_COMMUNITY): Payer: BLUE CROSS/BLUE SHIELD

## 2015-09-21 DIAGNOSIS — Z951 Presence of aortocoronary bypass graft: Secondary | ICD-10-CM

## 2015-09-23 ENCOUNTER — Encounter (HOSPITAL_COMMUNITY): Payer: BLUE CROSS/BLUE SHIELD

## 2015-09-23 ENCOUNTER — Encounter (HOSPITAL_COMMUNITY)
Admission: RE | Admit: 2015-09-23 | Discharge: 2015-09-23 | Disposition: A | Discharge status: Home / Self Care | Payer: BLUE CROSS/BLUE SHIELD | Source: Ambulatory Visit | Attending: Cardiology | Admitting: Cardiology

## 2015-09-23 DIAGNOSIS — Z951 Presence of aortocoronary bypass graft: Secondary | ICD-10-CM

## 2015-09-25 ENCOUNTER — Encounter (HOSPITAL_COMMUNITY)
Admission: RE | Admit: 2015-09-25 | Discharge: 2015-09-25 | Disposition: A | Discharge status: Home / Self Care | Payer: BLUE CROSS/BLUE SHIELD | Source: Ambulatory Visit | Attending: Cardiology | Admitting: Cardiology

## 2015-09-25 ENCOUNTER — Encounter (HOSPITAL_COMMUNITY): Payer: BLUE CROSS/BLUE SHIELD

## 2015-09-25 DIAGNOSIS — Z951 Presence of aortocoronary bypass graft: Secondary | ICD-10-CM

## 2015-09-28 ENCOUNTER — Encounter (HOSPITAL_COMMUNITY): Payer: BLUE CROSS/BLUE SHIELD

## 2015-09-28 ENCOUNTER — Encounter (HOSPITAL_COMMUNITY)
Admission: RE | Admit: 2015-09-28 | Discharge: 2015-09-28 | Disposition: A | Discharge status: Home / Self Care | Payer: BLUE CROSS/BLUE SHIELD | Source: Ambulatory Visit | Attending: Cardiology | Admitting: Cardiology

## 2015-09-28 DIAGNOSIS — F17210 Nicotine dependence, cigarettes, uncomplicated: Secondary | ICD-10-CM | POA: Diagnosis not present

## 2015-09-28 DIAGNOSIS — Z8249 Family history of ischemic heart disease and other diseases of the circulatory system: Secondary | ICD-10-CM | POA: Insufficient documentation

## 2015-09-28 DIAGNOSIS — Z951 Presence of aortocoronary bypass graft: Secondary | ICD-10-CM | POA: Insufficient documentation

## 2015-09-28 DIAGNOSIS — Z7902 Long term (current) use of antithrombotics/antiplatelets: Secondary | ICD-10-CM | POA: Diagnosis not present

## 2015-09-28 DIAGNOSIS — I10 Essential (primary) hypertension: Secondary | ICD-10-CM | POA: Insufficient documentation

## 2015-09-28 DIAGNOSIS — E782 Mixed hyperlipidemia: Secondary | ICD-10-CM | POA: Insufficient documentation

## 2015-09-28 DIAGNOSIS — Z79899 Other long term (current) drug therapy: Secondary | ICD-10-CM | POA: Insufficient documentation

## 2015-09-28 DIAGNOSIS — K219 Gastro-esophageal reflux disease without esophagitis: Secondary | ICD-10-CM | POA: Diagnosis not present

## 2015-09-28 DIAGNOSIS — Z7982 Long term (current) use of aspirin: Secondary | ICD-10-CM | POA: Insufficient documentation

## 2015-09-28 DIAGNOSIS — I2510 Atherosclerotic heart disease of native coronary artery without angina pectoris: Secondary | ICD-10-CM | POA: Diagnosis not present

## 2015-09-30 ENCOUNTER — Encounter (HOSPITAL_COMMUNITY): Payer: BLUE CROSS/BLUE SHIELD

## 2015-09-30 ENCOUNTER — Encounter (HOSPITAL_COMMUNITY)
Admission: RE | Admit: 2015-09-30 | Discharge: 2015-09-30 | Disposition: A | Discharge status: Home / Self Care | Payer: BLUE CROSS/BLUE SHIELD | Source: Ambulatory Visit | Attending: Cardiology | Admitting: Cardiology

## 2015-09-30 DIAGNOSIS — Z951 Presence of aortocoronary bypass graft: Secondary | ICD-10-CM

## 2015-10-02 ENCOUNTER — Encounter (HOSPITAL_COMMUNITY): Payer: BLUE CROSS/BLUE SHIELD

## 2015-10-02 ENCOUNTER — Encounter (HOSPITAL_COMMUNITY)
Admission: RE | Admit: 2015-10-02 | Discharge: 2015-10-02 | Disposition: A | Discharge status: Home / Self Care | Payer: BLUE CROSS/BLUE SHIELD | Source: Ambulatory Visit | Attending: Cardiology | Admitting: Cardiology

## 2015-10-02 DIAGNOSIS — Z951 Presence of aortocoronary bypass graft: Secondary | ICD-10-CM | POA: Diagnosis not present

## 2015-10-05 ENCOUNTER — Encounter (HOSPITAL_COMMUNITY): Payer: BLUE CROSS/BLUE SHIELD

## 2015-10-05 ENCOUNTER — Encounter (HOSPITAL_COMMUNITY)
Admission: RE | Admit: 2015-10-05 | Discharge: 2015-10-05 | Disposition: A | Discharge status: Home / Self Care | Payer: BLUE CROSS/BLUE SHIELD | Source: Ambulatory Visit | Attending: Cardiology | Admitting: Cardiology

## 2015-10-05 DIAGNOSIS — Z951 Presence of aortocoronary bypass graft: Secondary | ICD-10-CM | POA: Diagnosis not present

## 2015-10-07 ENCOUNTER — Encounter (HOSPITAL_COMMUNITY): Payer: BLUE CROSS/BLUE SHIELD

## 2015-10-07 ENCOUNTER — Encounter (HOSPITAL_COMMUNITY)
Admission: RE | Admit: 2015-10-07 | Discharge: 2015-10-07 | Disposition: A | Discharge status: Home / Self Care | Payer: BLUE CROSS/BLUE SHIELD | Source: Ambulatory Visit | Attending: Cardiology | Admitting: Cardiology

## 2015-10-07 DIAGNOSIS — Z951 Presence of aortocoronary bypass graft: Secondary | ICD-10-CM | POA: Diagnosis not present

## 2015-10-09 ENCOUNTER — Encounter (HOSPITAL_COMMUNITY): Payer: BLUE CROSS/BLUE SHIELD

## 2015-10-09 ENCOUNTER — Encounter (HOSPITAL_COMMUNITY): Payer: Self-pay

## 2015-10-09 ENCOUNTER — Encounter (HOSPITAL_COMMUNITY)
Admission: RE | Admit: 2015-10-09 | Discharge: 2015-10-09 | Disposition: A | Discharge status: Home / Self Care | Payer: BLUE CROSS/BLUE SHIELD | Source: Ambulatory Visit | Attending: Cardiology | Admitting: Cardiology

## 2015-10-09 DIAGNOSIS — Z951 Presence of aortocoronary bypass graft: Secondary | ICD-10-CM | POA: Diagnosis not present

## 2015-10-09 NOTE — Progress Notes (Signed)
Discharge Summary  Patient Details  Name: Donald Oliver MRN: [id number] Date of Birth: 1970-11-18 Referring Provider:        CARDIAC REHAB PHASE II ORIENTATION from 07/16/2015 in [facility]   Referring Provider  Dr. Tollie Eth, MD       Number of Visits: 36   Reason for Discharge:  Patient independent in their exercise.  Smoking History:  History  Smoking status  . Light Tobacco Smoker -- 1.00 packs/day for 30 years  Smokeless tobacco  . Never Used    Comment: Uses nicotene losanges    Diagnosis:  S/P CABG x 3  ADL UCSD:   Initial Exercise Prescription:     Initial Exercise Prescription - 07/16/15 1100    Date of Initial Exercise RX and Referring Provider   Date 07/16/15   Referring Provider Dr. Tollie Eth, MD   Treadmill   MPH 2.4   Grade 0   Minutes 10   METs 2.8   Bike   Level 1   METs 2   NuStep   Level 3   Minutes 10   METs 2   Prescription Details   Frequency (times per week) 3   Duration Progress to 30 minutes of continuous aerobic without signs/symptoms of physical distress   Intensity   THRR 40-80% of Max Heartrate 70-140   Ratings of Perceived Exertion 11-13   Progression   Progression Continue to progress workloads to maintain intensity without signs/symptoms of physical distress.   Resistance Training   Training Prescription Yes   Weight 2lbs   Reps 10-12      Discharge Exercise Prescription (Final Exercise Prescription Changes):     Exercise Prescription Changes - 10/08/15 1600    Exercise Review   Progression Yes   Response to Exercise   Blood Pressure (Admit) 118/84 mmHg   Blood Pressure (Exercise) 134/80 mmHg   Blood Pressure (Exit) 106/60 mmHg   Heart Rate (Admit) 92 bpm   Heart Rate (Exercise) 149 bpm   Heart Rate (Exit) 95 bpm   Rating of Perceived Exertion (Exercise) 13   Intensity THRR unchanged   Progression   Progression Continue to progress workloads to maintain intensity  without signs/symptoms of physical distress.   Average METs 6.2   Resistance Training   Training Prescription Yes   Weight 8lbs   Reps 10-12   Bike   Level 2   METs 5.31   Elliptical   Level 1   Minutes 10   METs 7.2   Rower   Level 2   Watts 47   Minutes 10   METs 5   Home Exercise Plan   Plans to continue exercise at Home  reviewed HEP on 08/05/15 see progress note   Frequency Add 3 additional days to program exercise sessions.      Functional Capacity:     6 Minute Walk      07/16/15 0946       6 Minute Walk   Phase Initial     Distance 1264 feet     Walk Time 6 minutes     # of Rest Breaks 0     MPH 2.4     METS 3.94     RPE 11     VO2 Peak 13.79     Symptoms No     Resting HR 83 bpm     Resting BP 128/80 mmHg     Max Ex. HR 86 bpm  Max Ex. BP 118/70 mmHg     2 Minute Post BP 102/70 mmHg        Psychological, QOL, Others - Outcomes: PHQ 2/9: Depression screen [facility] 2/9 10/09/2015 07/20/2015  Decreased Interest 0 0  Down, Depressed, Hopeless 1 3  PHQ - 2 Score 1 3  Altered sleeping - 0  Tired, decreased energy - 0  Change in appetite - 0  Feeling bad or failure about yourself  - 1  Trouble concentrating - 0  Moving slowly or fidgety/restless - 0  Suicidal thoughts - 0  PHQ-9 Score - 4  Difficult doing work/chores - Somewhat difficult    Quality of Life:     Quality of Life - 07/29/15 1650    Quality of Life Scores   GLOBAL Pre --  see progress note      Personal Goals: Goals established at orientation with interventions provided to work toward goal.     Personal Goals and Risk Factors at Admission - 08/12/15 1506    Core Components/Risk Factors/Patient Goals on Admission    Weight Management Yes   Increase Strength and Stamina Yes       Personal Goals Discharge:     Goals and Risk Factor Review      07/29/15 1424 08/12/15 1507 09/09/15 1726 10/07/15 1420 10/08/15 1658   Core Components/Risk Factors/Patient Goals Review    Personal Goals Review Weight Management/Obesity (p) Weight Management/Obesity;Increase Strength and Stamina Other;Improve shortness of breath with ADL's (p) Other;Weight Management/Obesity Weight Management/Obesity;Other   Review see 07/29/15 RD note. Pt wt is up 3.5 kg (7.7 lb) (p) Pt weight is stable and strength is improving. pt have some reservations with the healing process of his heart Pt is walking at home 3x/week in addition to CR. pt has noticed that SOB has improved, but notices that he forgets to breath (p) Pt is exercising at home and experiences little to no SOB with activities. Pt is trying to lose wt. and feels comfortable with nutritional changes and exercise changes that has to happen in order to lose wt Pt is still exercising but struggles with diet and losing wt. Discussed meeting with Parke Simmers and staying on track. Pt states he agrees   Expected Outcomes Prevent further wt gain / encourage 0.5-2 lb wt loss/week  Discussed temperature precautions and hydration. Pt will be mindful of weather, exercise 3x/week in addition to CR and perform ADL's with little to no SOB  Pt will get back on track and start to lose wt again      Nutrition & Weight - Outcomes:     Pre Biometrics - 07/16/15 0952    Pre Biometrics   Waist Circumference 41.75 inches   Hip Circumference 41 inches   Waist to Hip Ratio 1.02 %   Triceps Skinfold 20 mm   % Body Fat 29.1 %   Grip Strength 40 kg   Flexibility 0 in   Single Leg Stand 30 seconds       Nutrition:     Nutrition Therapy & Goals - 07/16/15 1018    Nutrition Therapy   Diet Therapeutic Lifestyle Changes   Personal Nutrition Goals   Personal Goal #1 Wt loss of 0.5-2 lb to a goal wt loss of 6-15 lb at graduation from [place], educate and counsel regarding individualized specific dietary modifications aiming towards targeted core components such as weight, hypertension, lipid management, diabetes,  heart failure and other comorbidities.  Expected Outcomes Short Term Goal: Understand basic principles of dietary content, such as calories, fat, sodium, cholesterol and nutrients.;Long Term Goal: Adherence to prescribed nutrition plan.      Nutrition Discharge:     Nutrition Assessments - 07/29/15 1424    MEDFICTS Scores   Pre Score 64      Education Questionnaire Score:     Knowledge Questionnaire Score - 07/16/15 1150    Knowledge Questionnaire Score   Pre Score 23/28      Goals reviewed with patient; copy given to patient.  Pt completed cardiac rehab program. Pt level of exertion significantly improved as did pt health related anxiety. Pt PHQ score decreased as a result. Pt plans to exercise on his own and feels more comfortable and confident doing so.

## 2015-10-12 ENCOUNTER — Encounter (HOSPITAL_COMMUNITY): Payer: BLUE CROSS/BLUE SHIELD

## 2015-10-12 ENCOUNTER — Encounter (HOSPITAL_COMMUNITY)
Admission: RE | Admit: 2015-10-12 | Discharge: 2015-10-12 | Disposition: A | Discharge status: Home / Self Care | Payer: BLUE CROSS/BLUE SHIELD | Source: Ambulatory Visit | Attending: Cardiology | Admitting: Cardiology

## 2015-10-12 DIAGNOSIS — Z951 Presence of aortocoronary bypass graft: Secondary | ICD-10-CM | POA: Diagnosis not present

## 2015-10-14 ENCOUNTER — Encounter (HOSPITAL_COMMUNITY): Payer: BLUE CROSS/BLUE SHIELD

## 2015-10-14 ENCOUNTER — Encounter (HOSPITAL_COMMUNITY)
Admission: RE | Admit: 2015-10-14 | Discharge: 2015-10-14 | Disposition: A | Discharge status: Home / Self Care | Payer: BLUE CROSS/BLUE SHIELD | Source: Ambulatory Visit | Attending: Cardiology | Admitting: Cardiology

## 2015-10-14 ENCOUNTER — Telehealth: Payer: Self-pay

## 2015-10-14 ENCOUNTER — Other Ambulatory Visit: Payer: Self-pay | Admitting: Medical

## 2015-10-14 VITALS — Ht 68.0 in | Wt 196.7 lb

## 2015-10-14 DIAGNOSIS — Z951 Presence of aortocoronary bypass graft: Secondary | ICD-10-CM

## 2015-10-14 MED ORDER — BUPROPION HCL ER (XL) 300 MG PO TB24: 300.0000 mg | ORAL_TABLET | Freq: Every day | ORAL | Status: DC

## 2015-10-14 NOTE — Telephone Encounter (Signed)
Med sent, have him f/u in 70mo on higher dose

## 2015-10-14 NOTE — Telephone Encounter (Signed)
Pt is aware.  

## 2015-10-14 NOTE — Telephone Encounter (Signed)
Pt called and stated that he doubled his dosage of welbutrin because he said it was not working. Wants to know if you can refill it [facility]mg  [facility]

## 2015-10-14 NOTE — Telephone Encounter (Signed)
LMTCB

## 2015-10-16 ENCOUNTER — Encounter (HOSPITAL_COMMUNITY): Payer: BLUE CROSS/BLUE SHIELD

## 2015-10-19 ENCOUNTER — Encounter (HOSPITAL_COMMUNITY): Payer: BLUE CROSS/BLUE SHIELD

## 2015-10-21 ENCOUNTER — Encounter (HOSPITAL_COMMUNITY): Payer: BLUE CROSS/BLUE SHIELD

## 2015-10-23 ENCOUNTER — Encounter (HOSPITAL_COMMUNITY): Payer: BLUE CROSS/BLUE SHIELD

## 2015-10-26 ENCOUNTER — Encounter (HOSPITAL_COMMUNITY): Payer: BLUE CROSS/BLUE SHIELD

## 2015-10-28 ENCOUNTER — Encounter (HOSPITAL_COMMUNITY): Payer: BLUE CROSS/BLUE SHIELD

## 2015-11-11 NOTE — Addendum Note (Signed)
Encounter addended by: Jewel Baize, RD on: 11/11/2015 12:03 PM<BR>    Actions taken: Flowsheet data copied forward, Visit Navigator Flowsheet section accepted

## 2015-11-24 ENCOUNTER — Ambulatory Visit: Payer: BLUE CROSS/BLUE SHIELD | Admitting: Medical

## 2015-12-17 ENCOUNTER — Encounter: Payer: Self-pay | Admitting: Medical

## 2015-12-17 ENCOUNTER — Ambulatory Visit (INDEPENDENT_AMBULATORY_CARE_PROVIDER_SITE_OTHER): Payer: BLUE CROSS/BLUE SHIELD | Admitting: Medical

## 2015-12-17 VITALS — BP 132/70 | HR 100 | Resp 16 | Wt 193.6 lb

## 2015-12-17 DIAGNOSIS — I10 Essential (primary) hypertension: Secondary | ICD-10-CM | POA: Diagnosis not present

## 2015-12-17 DIAGNOSIS — Z23 Encounter for immunization: Secondary | ICD-10-CM | POA: Insufficient documentation

## 2015-12-17 DIAGNOSIS — Z8249 Family history of ischemic heart disease and other diseases of the circulatory system: Secondary | ICD-10-CM

## 2015-12-17 DIAGNOSIS — R7301 Impaired fasting glucose: Secondary | ICD-10-CM | POA: Diagnosis not present

## 2015-12-17 DIAGNOSIS — I2510 Atherosclerotic heart disease of native coronary artery without angina pectoris: Secondary | ICD-10-CM | POA: Diagnosis not present

## 2015-12-17 DIAGNOSIS — Z951 Presence of aortocoronary bypass graft: Secondary | ICD-10-CM

## 2015-12-17 DIAGNOSIS — N528 Other male erectile dysfunction: Secondary | ICD-10-CM

## 2015-12-17 DIAGNOSIS — K219 Gastro-esophageal reflux disease without esophagitis: Secondary | ICD-10-CM

## 2015-12-17 HISTORY — DX: Encounter for immunization: Z23

## 2015-12-17 LAB — LIPID PANEL
CHOL/HDL RATIO: 4.9 ratio (ref <=5.0)
Cholesterol: 195 mg/dL (ref 125–200)
HDL: 40 mg/dL (ref >=40)
LDL Cholesterol: 100 mg/dL (ref <130)
Triglycerides: 275 mg/dL — ABNORMAL HIGH (ref <150)
VLDL: 55 mg/dL — ABNORMAL HIGH (ref <30)

## 2015-12-17 LAB — COMPREHENSIVE METABOLIC PANEL
ALBUMIN: 4.7 g/dL (ref 3.6–5.1)
ALT: 33 U/L (ref 9–46)
AST: 24 U/L (ref 10–40)
Alkaline Phosphatase: 66 U/L (ref 40–115)
BILIRUBIN TOTAL: 0.8 mg/dL (ref 0.2–1.2)
BUN: 16 mg/dL (ref 7–25)
CO2: 21 mmol/L (ref 20–31)
CREATININE: 0.82 mg/dL (ref 0.60–1.35)
Calcium: 9.2 mg/dL (ref 8.6–10.3)
Chloride: 105 mmol/L (ref 98–110)
GLUCOSE: 89 mg/dL (ref 65–99)
Potassium: 3.5 mmol/L (ref 3.5–5.3)
SODIUM: 139 mmol/L (ref 135–146)
Total Protein: 7.1 g/dL (ref 6.1–8.1)

## 2015-12-17 LAB — CK: Total CK: 137 U/L (ref 7–232)

## 2015-12-17 NOTE — Progress Notes (Signed)
Subjective: Chief Complaint  Patient presents with  . Follow-up    CAD, cholesterol, HTN, and glucose. concerns of cholesterol medication causing muscle fatique. pt reports drinking coffee this am.    Here for f/u.   Overall doing ok.  Finished cardiac rehab.  Back at work. Walks 6+ miles on the job, works at [employer].  His only c/o is getting a lot muscle aches that he attributes to Crestor.  Has been on Lovastatin in the past.  He is compliant with Niaspan and Crestor.     He was hospitalized 05/29/15 - 06/06/15 for NSTEMI, unstable angina, and is now s/p CABG x 3 vessel.  He continues abstinent from tobacco.  Smoked for 20 years, 1 ppd.       No new c/o.  Past Medical History:  Diagnosis Date  . CAD (coronary artery disease), native coronary artery 06/01/2015   Cath 3/3 80% LAD, 99% diag, 50% circ, RCA 100% with Lto R collaterals  CABG 06/02/15 Dr. Cyndia Bent with LIMA to LAD, free RIMA to Diag and SVG to RCA    . Combined hyperlipidemia   . FH: premature coronary heart disease    Father had MI at age 62   . GERD (gastroesophageal reflux disease)   . History of substance abuse    Pt had abused amphetamines : quit in 2005  . Hypertension 2005  . Impaired fasting blood sugar 2016  . Onychomycosis    Current Outpatient Prescriptions on File Prior to Visit  Medication Sig Dispense Refill  . aspirin 325 MG EC tablet Take 325 mg by mouth daily.    Marland Kitchen buPROPion (WELLBUTRIN XL) 300 MG 24 hr tablet Take 1 tablet (300 mg total) by mouth daily. 90 tablet 0  . lisinopril-hydrochlorothiazide (PRINZIDE,ZESTORETIC) 20-25 MG tablet Take 1 tablet by mouth daily.    . Multiple Vitamin (MULTIVITAMIN WITH MINERALS) TABS tablet Take 1 tablet by mouth daily. Reported on 07/20/2015    . niacin 500 MG CR capsule Take 1 capsule (500 mg total) by mouth at bedtime. 90 capsule 3  . nicotine polacrilex (COMMIT) 4 MG lozenge Take 1 lozenge (4 mg total) by mouth as needed for smoking cessation. (Patient taking  differently: Take 2 mg by mouth as needed for smoking cessation. ) 100 tablet 0  . rosuvastatin (CRESTOR) 10 MG tablet Take 1 tablet (10 mg total) by mouth daily. 90 tablet 3  . albuterol (PROVENTIL HFA;VENTOLIN HFA) 108 (90 Base) MCG/ACT inhaler Inhale 2 puffs into the lungs every 6 (six) hours as needed for wheezing or shortness of breath. (Patient not taking: Reported on 12/17/2015) 1 Inhaler 1   No current facility-administered medications on file prior to visit.     Past Surgical History:  Procedure Laterality Date  . CARDIAC CATHETERIZATION N/A 05/29/2015   Procedure: Left Heart Cath and Coronary Angiography;  Surgeon: Jettie Booze, MD;  Location: [facility];  Service: Cardiovascular;  Laterality: N/A;  . CARDIAC CATHETERIZATION  05/29/2015   Procedure: Coronary Balloon Angioplasty;  Surgeon: Jettie Booze, MD;  Location: [place] [facility];  Service: Cardiovascular;;  . CORONARY ARTERY BYPASS GRAFT N/A 06/02/2015   Procedure: CORONARY ARTERY BYPASS GRAFTING (CABG) x  3 utilizing bilateral internal mammary artery and endoscopically harvested right sapheneous vein.;  Surgeon: [name], MD;  Location: [facility];  Service: Open Heart Surgery;  Laterality: N/A;  . RHINOPLASTY  20065   w/repair of nasal fractuare; done at [facility]   . TEE  WITHOUT CARDIOVERSION N/A 06/02/2015   Procedure: TRANSESOPHAGEAL ECHOCARDIOGRAM (TEE);  Surgeon: [name], MD;  Location: [facility];  Service: Open Heart Surgery;  Laterality: N/A;     Objective: BP 132/70   Pulse 100   Resp 16   Wt 193 lb 9.6 oz (87.8 kg)   BMI 29.44 kg/m   BP Readings from Last 3 Encounters:  12/17/15 132/70  08/26/15 (!) 129/92  07/20/15 120/90   Wt Readings from Last 3 Encounters:  12/17/15 193 lb 9.6 oz (87.8 kg)  10/16/15 196 lb 10.4 oz (89.2 kg)  08/26/15 195 lb (88.5 kg)    Gen: wd, wn nad Central sternal surgical scar of chest Lungs clear Heart RRR normal s1, s2, no murmurs Ext: no  edema Pulses 1+ UE and LE Neck: supple, nontender, no mass, no bruit, no thyromegaly Psych: pleasant, good eye contact   Assessment: Encounter Diagnoses  Name Primary?  . Coronary artery disease involving native coronary artery of native heart without angina pectoris Yes  . Essential hypertension   . Impaired fasting blood sugar   . Gastroesophageal reflux disease without esophagitis   . Family history of premature CAD   . S/P CABG x 3   . Other male erectile dysfunction   . Need for prophylactic vaccination and inoculation against influenza   . Need for prophylactic vaccination against Streptococcus pneumoniae (pneumococcus)      Plan: Advised he stop Niaspan for now, stop Crestor a few days to let muscles recover.   Will need to stay on statin if at all possible Labs today  Switch Aspirin to night time, consider adding Coenzyme Q10 daily C/t Wellbutrin for now C/t Lisinopril HCT 20/25mg  daily  Counseled on the influenza virus vaccine.  Vaccine information sheet given.  Influenza vaccine given after consent obtained.  Counseled on the pneumococcal vaccine.  Vaccine information sheet given.  Pneumococcal vaccine PPSV23 given after consent obtained.  Spent > 45 minutes face to face with patient in discussion of symptoms, evaluation, plan and recommendations.    Mal was seen today for follow-up.  Diagnoses and all orders for this visit:  Coronary artery disease involving native coronary artery of native heart without angina pectoris  Essential hypertension  Impaired fasting blood sugar  Gastroesophageal reflux disease without esophagitis  Family history of premature CAD  S/P CABG x 3  Other male erectile dysfunction  Need for prophylactic vaccination and inoculation against influenza  Need for prophylactic vaccination against Streptococcus pneumoniae (pneumococcus)

## 2015-12-18 ENCOUNTER — Other Ambulatory Visit: Payer: Self-pay | Admitting: Medical

## 2015-12-18 MED ORDER — ROSUVASTATIN CALCIUM 5 MG PO TABS: 5.0000 mg | ORAL_TABLET | Freq: Every day | ORAL | 0 refills | Status: DC

## 2015-12-21 ENCOUNTER — Encounter (HOSPITAL_COMMUNITY): Payer: Self-pay | Admitting: Emergency Medicine

## 2015-12-21 ENCOUNTER — Emergency Department (HOSPITAL_COMMUNITY)
Admission: EM | Admit: 2015-12-21 | Discharge: 2015-12-21 | Disposition: A | Discharge status: Home / Self Care | Payer: BLUE CROSS/BLUE SHIELD | Attending: Emergency Medicine | Admitting: Emergency Medicine

## 2015-12-21 ENCOUNTER — Emergency Department (HOSPITAL_COMMUNITY): Payer: BLUE CROSS/BLUE SHIELD

## 2015-12-21 DIAGNOSIS — Z951 Presence of aortocoronary bypass graft: Secondary | ICD-10-CM | POA: Insufficient documentation

## 2015-12-21 DIAGNOSIS — Z7982 Long term (current) use of aspirin: Secondary | ICD-10-CM | POA: Diagnosis not present

## 2015-12-21 DIAGNOSIS — I2510 Atherosclerotic heart disease of native coronary artery without angina pectoris: Secondary | ICD-10-CM | POA: Insufficient documentation

## 2015-12-21 DIAGNOSIS — R079 Chest pain, unspecified: Secondary | ICD-10-CM | POA: Diagnosis not present

## 2015-12-21 DIAGNOSIS — Z5321 Procedure and treatment not carried out due to patient leaving prior to being seen by health care provider: Secondary | ICD-10-CM | POA: Insufficient documentation

## 2015-12-21 DIAGNOSIS — Z87891 Personal history of nicotine dependence: Secondary | ICD-10-CM | POA: Insufficient documentation

## 2015-12-21 DIAGNOSIS — I10 Essential (primary) hypertension: Secondary | ICD-10-CM | POA: Insufficient documentation

## 2015-12-21 LAB — BASIC METABOLIC PANEL
Anion gap: 15 (ref 5–15)
BUN: 15 mg/dL (ref 6–20)
CALCIUM: 10.1 mg/dL (ref 8.9–10.3)
CO2: 21 mmol/L — ABNORMAL LOW (ref 22–32)
Chloride: 104 mmol/L (ref 101–111)
Creatinine, Ser: 0.98 mg/dL (ref 0.61–1.24)
GFR calc Af Amer: >60 mL/min (ref >60)
GLUCOSE: 88 mg/dL (ref 65–99)
Potassium: 3.1 mmol/L — ABNORMAL LOW (ref 3.5–5.1)
Sodium: 140 mmol/L (ref 135–145)

## 2015-12-21 LAB — I-STAT TROPONIN, ED: TROPONIN I, POC: 0.01 ng/mL (ref 0.00–0.08)

## 2015-12-21 LAB — CBC
HCT: 46.1 % (ref 39.0–52.0)
Hemoglobin: 15.8 g/dL (ref 13.0–17.0)
MCH: 29.7 pg (ref 26.0–34.0)
MCHC: 34.3 g/dL (ref 30.0–36.0)
MCV: 86.7 fL (ref 78.0–100.0)
Platelets: 319 10*3/uL (ref 150–400)
RBC: 5.32 MIL/uL (ref 4.22–5.81)
RDW: 14 % (ref 11.5–15.5)
WBC: 9.4 10*3/uL (ref 4.0–10.5)

## 2015-12-21 NOTE — ED Triage Notes (Signed)
Pt sts left sided CP with radiation down arm x 3 days worse today; pt sts pain in right shoulder and right arm as well

## 2015-12-21 NOTE — ED Notes (Signed)
Pt does not want to wait any longer. Pt plans to follow up with Doctor tomorrow morning. Pt has left.

## 2015-12-26 ENCOUNTER — Other Ambulatory Visit: Payer: Self-pay | Admitting: Medical

## 2015-12-28 NOTE — Telephone Encounter (Signed)
Is this okay to refill? 

## 2016-02-16 ENCOUNTER — Encounter: Payer: Self-pay | Admitting: Medical

## 2016-02-28 ENCOUNTER — Other Ambulatory Visit: Payer: Self-pay | Admitting: Medical

## 2016-03-27 ENCOUNTER — Other Ambulatory Visit: Payer: Self-pay | Admitting: Medical

## 2016-03-29 ENCOUNTER — Other Ambulatory Visit: Payer: Self-pay

## 2016-03-29 MED ORDER — BUPROPION HCL ER (XL) 300 MG PO TB24: 300.0000 mg | ORAL_TABLET | Freq: Every day | ORAL | 0 refills | Status: DC

## 2016-03-29 NOTE — Telephone Encounter (Signed)
Can he have a refill on this. 

## 2016-06-21 ENCOUNTER — Other Ambulatory Visit: Payer: Self-pay

## 2016-06-21 ENCOUNTER — Encounter: Payer: Self-pay | Admitting: Medical

## 2016-06-21 ENCOUNTER — Ambulatory Visit (INDEPENDENT_AMBULATORY_CARE_PROVIDER_SITE_OTHER): Payer: BLUE CROSS/BLUE SHIELD | Admitting: Medical

## 2016-06-21 VITALS — BP 122/86 | HR 74 | Wt 203.8 lb

## 2016-06-21 DIAGNOSIS — E782 Mixed hyperlipidemia: Secondary | ICD-10-CM | POA: Diagnosis not present

## 2016-06-21 DIAGNOSIS — I10 Essential (primary) hypertension: Secondary | ICD-10-CM

## 2016-06-21 DIAGNOSIS — M7741 Metatarsalgia, right foot: Secondary | ICD-10-CM | POA: Diagnosis not present

## 2016-06-21 DIAGNOSIS — M791 Myalgia, unspecified site: Secondary | ICD-10-CM | POA: Insufficient documentation

## 2016-06-21 DIAGNOSIS — L910 Hypertrophic scar: Secondary | ICD-10-CM | POA: Insufficient documentation

## 2016-06-21 DIAGNOSIS — R0681 Apnea, not elsewhere classified: Secondary | ICD-10-CM | POA: Insufficient documentation

## 2016-06-21 DIAGNOSIS — R0683 Snoring: Secondary | ICD-10-CM | POA: Insufficient documentation

## 2016-06-21 DIAGNOSIS — R400 Somnolence: Secondary | ICD-10-CM | POA: Insufficient documentation

## 2016-06-21 HISTORY — DX: Metatarsalgia, right foot: M77.41

## 2016-06-21 HISTORY — DX: Hypertrophic scar: L91.0

## 2016-06-21 MED ORDER — AMLODIPINE BESYLATE 2.5 MG PO TABS: 2.5000 mg | ORAL_TABLET | Freq: Every day | ORAL | 1 refills | Status: DC

## 2016-06-21 MED ORDER — ROSUVASTATIN CALCIUM 10 MG PO TABS: 20.0000 mg | ORAL_TABLET | Freq: Every day | ORAL | 1 refills | Status: DC

## 2016-06-21 NOTE — Progress Notes (Signed)
Subjective: Chief Complaint  Patient presents with  . rt foot pain    rt foot pain feel like his toes are broken or jammed    Here for right foot pain.  No recent injury, trauma, or fall.    Wonders about morton's neuroma.  Looked it up on Internet.  He notes several year hx/o pain in right foot, mostly 4th toe but sometimes distal lateral food.  Feels like something is stuck in the foot at times, other times a burning pain.    Feels numb in right toe at times.  Had foot fungus we saw him for in the past, but it took about a year for prior toe/foot fungus to resolve.   Last visit 11/2015 we had him temporarily stop crestor given aches in muscles.  Since then he didn't call back, but he has seen cardiology, Dr. Wynonia Lawman.   He recent increased his statin to Crestor 20mg  daily, and he is taking Niacin 300mg  daily OTC.  He takes this in the morning;  He has trouble staying asleep for recent months,  Having trouble getting his BPs under control.  Snores loud, wife has recorded how bad he sounds during sleep.  Feels sleepy in the day sometimes.  Has ares of skin on chest that is pink and raised up since having open heart surgery.   No other aggravating or relieving factors. No other complaint.   Past Medical History:  Diagnosis Date  . CAD (coronary artery disease), native coronary artery 06/01/2015   Cath 3/3 80% LAD, 99% diag, 50% circ, RCA 100% with Lto R collaterals  CABG 06/02/15 Dr. Cyndia Bent with LIMA to LAD, free RIMA to Diag and SVG to RCA    . Combined hyperlipidemia   . FH: premature coronary heart disease    Father had MI at age 44   . GERD (gastroesophageal reflux disease)   . History of substance abuse    Pt had abused amphetamines : quit in 2005  . Hypertension 2005  . Impaired fasting blood sugar 2016  . Onychomycosis    Current Outpatient Prescriptions on File Prior to Visit  Medication Sig Dispense Refill  . aspirin 325 MG EC tablet Take 81 mg by mouth daily.     Marland Kitchen buPROPion  (WELLBUTRIN XL) 300 MG 24 hr tablet Take 1 tablet (300 mg total) by mouth daily. 90 tablet 0  . lisinopril-hydrochlorothiazide (PRINZIDE,ZESTORETIC) 20-25 MG tablet Take 1 tablet by mouth daily.    . nicotine polacrilex (COMMIT) 4 MG lozenge Take 1 lozenge (4 mg total) by mouth as needed for smoking cessation. (Patient taking differently: Take 2 mg by mouth as needed for smoking cessation. ) 100 tablet 0   No current facility-administered medications on file prior to visit.    Review of Systems Constitutional: -fever, -chills, -sweats, -unexpected weight change, +fatigue ENT: -runny nose, -ear pain, -sore throat Cardiology:  -chest pain, -palpitations, -edema Respiratory: -cough, -shortness of breath, -wheezing Gastroenterology: -abdominal pain, -nausea, -vomiting, -diarrhea, -constipation  Hematology: -bleeding or bruising problems Musculoskeletal: +arthralgias, +myalgias, -joint swelling, -back pain Ophthalmology: -vision changes Urology: -dysuria, -difficulty urinating, -hematuria, -urinary frequency, -urgency Neurology: -headache, -weakness,+tingling, -numbness    Objective: BP 122/86   Pulse 74   Wt 203 lb 12.8 oz (92.4 kg)   SpO2 96%   BMI 30.99 kg/m   Gen: wd, wn, nad Right foot with mild tenderness over right foot between 3rd and 4th metatarsal area.  No obvious bony or cystic deformity or mass.  Toes,  ankle and foot ROM WNL, 1+ pedal pulses, cap refill a little delayed, sensation and strength of toes and foot seem normal.  No other foot deformity , mass or swelling.  Rest of legs and ankles WNL No edema Middle of chest vertical pink raised thickened skin suggestive of keloid scar Oral MMM, no lesions but airway is small Neck supple, nontender, no mass, no bruits   Assessment: Encounter Diagnoses  Name Primary?  . Metatarsalgia of right foot Yes  . Snores   . Daytime somnolence   . Witnessed apneic spells   . Essential hypertension   . Keloid scar of skin   .  Myalgia   . Combined hyperlipidemia     Plan: Metatarsalgia - can use prn ice, foot lotion, OTC splint, but refer to podiatry for further treatment options  Snoring, daytime somnolence, HTN - refer for sleep study  HTN - c/t current medications  hyperlipidemia - change statin and aspirin and coenzyme q10 to QHS, f/u here or with Dr. Wynonia Lawman in 6- 8 weeks  Myalgia - resolved from prior visit  Donald Oliver was seen today for rt foot pain.  Diagnoses and all orders for this visit:  Metatarsalgia of right foot -     Ambulatory referral to Podiatry  Snores -     Split night study; Future  Daytime somnolence  Witnessed apneic spells  Essential hypertension  Keloid scar of skin -     Ambulatory referral to Dermatology  Myalgia  Combined hyperlipidemia  Spent > 45 minutes face to face with patient in discussion of symptoms, evaluation, plan and recommendations.

## 2016-06-21 NOTE — Patient Instructions (Signed)
Consider looking for Metatarsalgia foot splint to take pressure off the area.  In the meantime, ice the foot for 20 minutes daily when worse pain You can use OTC heat balms like icy hot topically

## 2016-06-27 ENCOUNTER — Other Ambulatory Visit: Payer: Self-pay

## 2016-06-27 ENCOUNTER — Telehealth: Payer: Self-pay

## 2016-06-27 MED ORDER — PANTOPRAZOLE SODIUM 40 MG PO TBEC: 40.0000 mg | DELAYED_RELEASE_TABLET | Freq: Every day | ORAL | 1 refills | Status: DC

## 2016-06-27 NOTE — Telephone Encounter (Signed)
done

## 2016-06-27 NOTE — Telephone Encounter (Signed)
Pt needs pantoprazole called into [facility].

## 2016-07-01 ENCOUNTER — Other Ambulatory Visit: Payer: Self-pay | Admitting: Medical

## 2016-07-01 NOTE — Telephone Encounter (Signed)
He needs a follow-up appointment

## 2016-07-01 NOTE — Telephone Encounter (Signed)
Is this okay to refill? 

## 2016-07-08 ENCOUNTER — Ambulatory Visit (INDEPENDENT_AMBULATORY_CARE_PROVIDER_SITE_OTHER): Payer: BLUE CROSS/BLUE SHIELD

## 2016-07-08 ENCOUNTER — Ambulatory Visit (INDEPENDENT_AMBULATORY_CARE_PROVIDER_SITE_OTHER): Payer: BLUE CROSS/BLUE SHIELD | Admitting: Podiatry

## 2016-07-08 ENCOUNTER — Encounter: Payer: Self-pay | Admitting: Podiatry

## 2016-07-08 ENCOUNTER — Telehealth: Payer: Self-pay | Admitting: *Deleted

## 2016-07-08 VITALS — Resp 16 | Ht 69.0 in | Wt 200.0 lb

## 2016-07-08 DIAGNOSIS — D3610 Benign neoplasm of peripheral nerves and autonomic nervous system, unspecified: Secondary | ICD-10-CM

## 2016-07-08 DIAGNOSIS — M79671 Pain in right foot: Secondary | ICD-10-CM

## 2016-07-08 NOTE — Progress Notes (Signed)
Subjective:     Patient ID: Donald Oliver, male   DOB: [birth year], 46 y.o.   MRN: [id number]  HPI patient states he's had a lot of burning in the right third and fourth toes with radiating discomforts and he feels like there is a ball in between his toes that becomes painful states it's been present for at least 2 years   Review of Systems  All other systems reviewed and are negative.      Objective:   Physical Exam  Constitutional: He is oriented to person, place, and time.  Cardiovascular: Intact distal pulses.   Musculoskeletal: Normal range of motion.  Neurological: He is oriented to person, place, and time.  Skin: Skin is warm.  Nursing note and vitals reviewed.  neurovascular status intact muscle strength adequate range of motion within normal limits with patient found to have exquisite discomfort with radiating shooting pain between the third and fourth toe on the right foot that are painful when pressed. Patient states these tried wider shoes and other modalities without relief and it is becoming more of an issue for him and patient did have a positive Biagio Borg sign     Assessment:     Neuroma symptomatology third interspace right foot with shooting-like pain and positive Mulder sign    Plan:     H&P and condition reviewed at great length. Due to long-standing nature and his failure to respond wider shoes and other modalities I do think in the long run surgical intervention is indicated given a two-year history. Patient wants this done and I allowed him to read consent form reviewing alternative treatments complications and did discuss neuro lysis treatment which she is not interested in. I did explain is no guarantee this will solve his problem as it is a clinical diagnosis and that recovery will take 4-6 months for complete recovery. Patient wants surgery signed consent form is given all preoperative instructions and is encouraged to call with any questions

## 2016-07-08 NOTE — Patient Instructions (Addendum)
Pre-Operative Instructions  Congratulations, you have decided to take an important step to improving your quality of life.  You can be assured that the doctors of [facility] will be with you every step of the way.  1. Plan to be at the surgery center/hospital at least 1 (one) hour prior to your scheduled time unless otherwise directed by the surgical center/hospital staff.  You must have a responsible adult accompany you, remain during the surgery and drive you home.  Make sure you have directions to the surgical center/hospital and know how to get there on time. 2. For hospital based surgery you will need to obtain a history and physical form from your family physician within 1 month prior to the date of surgery- we will give you a form for you primary physician.  3. We make every effort to accommodate the date you request for surgery.  There are however, times where surgery dates or times have to be moved.  We will contact you as soon as possible if a change in schedule is required.   4. No Aspirin/Ibuprofen for one week before surgery.  If you are on aspirin, any non-steroidal anti-inflammatory medications (Mobic, Aleve, Ibuprofen) you should stop taking it 7 days prior to your surgery.  You make take Tylenol  For pain prior to surgery.  5. Medications- If you are taking daily heart and blood pressure medications, seizure, reflux, allergy, asthma, anxiety, pain or diabetes medications, make sure the surgery center/hospital is aware before the day of surgery so they may notify you which medications to take or avoid the day of surgery. 6. No food or drink after midnight the night before surgery unless directed otherwise by surgical center/hospital staff. 7. No alcoholic beverages 24 hours prior to surgery.  No smoking 24 hours prior to or 24 hours after surgery. 8. Wear loose pants or shorts- loose enough to fit over bandages, boots, and casts. 9. No slip on shoes, sneakers are best. 10. Bring  your boot with you to the surgery center/hospital.  Also bring crutches or a walker if your physician has prescribed it for you.  If you do not have this equipment, it will be provided for you after surgery. 11. If you have not been contracted by the surgery center/hospital by the day before your surgery, call to confirm the date and time of your surgery. 12. Leave-time from work may vary depending on the type of surgery you have.  Appropriate arrangements should be made prior to surgery with your employer. 13. Prescriptions will be provided immediately following surgery by your doctor.  Have these filled as soon as possible after surgery and take the medication as directed. 14. Remove nail polish on the operative foot. 15. Wash the night before surgery.  The night before surgery wash the foot and leg well with the antibacterial soap provided and water paying special attention to beneath the toenails and in between the toes.  Rinse thoroughly with water and dry well with a towel.  Perform this wash unless told not to do so by your physician.  Enclosed: 1 Ice pack (please put in freezer the night before surgery)   1 Hibiclens skin cleaner   Pre-op Instructions  If you have any questions regarding the instructions, do not hesitate to call our office.  [place]: [address] [place], [place] [address] [phone]  [place]: [address] [place], [place] [address] [phone]  [place]: [address]  [place], [place] [address] [phone]   Dr.   Ila Mcgill DPM, Dr. Celesta Gentile DPM, Dr. Lanelle Bal DPM, Dr. Landis Martins DPMMorton Neuralgia [place] Lathe neuralgia is a type of foot pain in the area closest to your toes. This area is sometimes called the ball of your foot. Morton neuralgia occurs when a branch of a nerve in your foot (digital nerve) becomes compressed. When this happens over a long period of time, the nerve can thicken (neuroma) and cause pain. This usually occurs between the  third and fourth toe. Morton neuralgia can come and go but may get worse over time. What are the causes? Your digital nerve can become compressed and stretched at a point where it passes under a thick band of tissue that connects your toes (intermetatarsal ligament). Morton neuralgia can be caused by mild repetitive damage in this area. This type of damage can result from:  Activities such as running or jumping.  Wearing shoes that are too tight. What increases the risk? You may be at risk for Morton neuralgia if you:  Are male.  Wear high heels.  Wear shoes that are narrow or tight.  Participate in activities that stretch your toes. These include:  Running.  Ballet.  Long-distance walking. What are the signs or symptoms? The first symptom of Morton neuralgia is pain that spreads from the ball of your foot to your toes. It may feel like you are walking on a marble. Pain usually gets worse with walking and goes away at night. Other symptoms may include numbness and cramping of your toes. How is this diagnosed? Your health care provider will do a physical exam. When doing the exam, your health care provider may:  Squeeze your foot just behind your toe.  Ask you to move your toes to check for pain. You may also have tests on your foot to confirm the diagnosis. These may include:  An X-ray.  An MRI. How is this treated? Treatment for Morton neuralgia may be as simple as changing the kind of shoes you wear. Other treatments may include:  Wearing a supportive pad (orthosis) under the front of your foot. This lifts your toe bones and takes pressure off the nerve.  Getting injections of numbing medicine and anti-inflammatory medicine (steroid) in the nerve.  Having surgery to remove part of the thickened nerve. Follow these instructions at home:  Take medicine only as directed by your health care provider.  Wear soft-soled shoes with a wide toe area.  Stop activities that  may be causing pain.  Elevate your foot when resting.  Massage your foot.  Apply ice to the injured area:  Put ice in a plastic bag.  Place a towel between your skin and the bag.  Leave the ice on for 20 minutes, 2-3 times a day.  Keep all follow-up visits as directed by your health care provider. This is important. Contact a health care provider if:  Home care instructions are not helping you get better.  Your symptoms change or get worse. This information is not intended to replace advice given to you by your health care provider. Make sure you discuss any questions you have with your health care provider. Document Released: 06/20/2000 Document Revised: 08/20/2015 Document Reviewed: 05/15/2013 Elsevier Interactive Patient Education  2017 [employer].

## 2016-07-08 NOTE — Telephone Encounter (Signed)
"  I saw Dr. Paulla Dolly this morning.  He wanted me to schedule surgery.  I had to check with my wife to see when would be a good time.  So now I am ready to schedule."  What date do you have in mind?  "They had mentioned to me May 1."  It is available.  I will get it scheduled.  Someone from the surgical center will call you with the arrival time a day or two prior to the surgery date.  You can register with the surgical center now.

## 2016-07-08 NOTE — Progress Notes (Signed)
   Subjective:    Patient ID: Donald Oliver, male    DOB: 1970-09-20, 46 y.o.   MRN: [id number]  HPI  Chief Complaint  Patient presents with  . Foot Pain    Right foot; dorsal-below 4th toe; joints of toes hurts; pt stated, "Has burning sensation on all toes"; x2 yrs     Review of Systems  All other systems reviewed and are negative.      Objective:   Physical Exam        Assessment & Plan:

## 2016-07-21 ENCOUNTER — Telehealth: Payer: Self-pay | Admitting: *Deleted

## 2016-07-21 NOTE — Telephone Encounter (Signed)
Pt states he is having surgery on Tuesday and has questions. I spoke with pt and he asked if he could remain on the aspirin the doctor put him on. I told him Dr. Paulla Dolly allowed pt to continue aspirin prescribed by doctor, and he would probably have a little more post op bleeding than pt not on aspirin. Pt asked if he had to have anesthesia, because it caused constipation in him and made in cognitively slow for days. I told him I would inform Dr. Paulla Dolly and the surgery scheduler and have her call him with Dr. Mellody Drown response.

## 2016-07-26 ENCOUNTER — Encounter: Payer: Self-pay | Admitting: Podiatry

## 2016-07-26 DIAGNOSIS — G5761 Lesion of plantar nerve, right lower limb: Secondary | ICD-10-CM | POA: Diagnosis not present

## 2016-08-01 ENCOUNTER — Ambulatory Visit (INDEPENDENT_AMBULATORY_CARE_PROVIDER_SITE_OTHER): Payer: BLUE CROSS/BLUE SHIELD | Admitting: Podiatry

## 2016-08-01 ENCOUNTER — Encounter: Payer: Self-pay | Admitting: Podiatry

## 2016-08-01 VITALS — Temp 98.5°F

## 2016-08-01 DIAGNOSIS — D3610 Benign neoplasm of peripheral nerves and autonomic nervous system, unspecified: Secondary | ICD-10-CM | POA: Diagnosis not present

## 2016-08-02 ENCOUNTER — Encounter: Payer: Self-pay | Admitting: Podiatry

## 2016-08-03 NOTE — Progress Notes (Signed)
Subjective:    Patient ID: Donald Oliver, male   DOB: 46 y.o.   MRN: [id number]   HPI patient states she's doing really well    ROS      Objective:  Physical Exam Neurovascular status intact negative Homans sign noted with wound edges well coapted right third interspace with no drainage or opening of the incision site    Assessment:   Doing well post neurectomy third interspace right      Plan:     Advised on continued elevation compression and reduced activity and will be seen back again in the next 4-6 weeks or earlier if any issues should occur

## 2016-08-04 NOTE — Progress Notes (Signed)
DOS [id number] Excision Soft Tissue mass (suspect neuroma) 3rd innerspace Rt foot

## 2016-08-15 ENCOUNTER — Ambulatory Visit (HOSPITAL_BASED_OUTPATIENT_CLINIC_OR_DEPARTMENT_OTHER): Payer: BLUE CROSS/BLUE SHIELD | Attending: Medical | Admitting: Internal Medicine

## 2016-08-15 DIAGNOSIS — G4733 Obstructive sleep apnea (adult) (pediatric): Secondary | ICD-10-CM | POA: Diagnosis not present

## 2016-08-15 DIAGNOSIS — R0683 Snoring: Secondary | ICD-10-CM | POA: Insufficient documentation

## 2016-08-21 DIAGNOSIS — R0683 Snoring: Secondary | ICD-10-CM

## 2016-08-21 NOTE — Procedures (Signed)
Patient Name: Donald Oliver, Donald Oliver Date: 08/15/2016 Gender: Male D.O.B: 01-14-1971 Age (years): 45 Referring Provider: Chana Bode Height (inches): 69 Interpreting Physician: Baird Lyons MD, ABSM Weight (lbs): 202 RPSGT: Madelon Lips BMI: 30 MRN: [id number] Neck Size: 16.00 CLINICAL INFORMATION Sleep Study Type: Split Night CPAP  Indication for sleep study: Snoring  Epworth Sleepiness Score: 14  SLEEP STUDY TECHNIQUE As per the AASM Manual for the Scoring of Sleep and Associated Events v2.3 (April 2016) with a hypopnea requiring 4% desaturations.  The channels recorded and monitored were frontal, central and occipital EEG, electrooculogram (EOG), submentalis EMG (chin), nasal and oral airflow, thoracic and abdominal wall motion, anterior tibialis EMG, snore microphone, electrocardiogram, and pulse oximetry. Continuous positive airway pressure (CPAP) was initiated when the patient met split night criteria and was titrated according to treat sleep-disordered breathing.  MEDICATIONS Medications self-administered by patient taken the night of the study : none reported  RESPIRATORY PARAMETERS Diagnostic  Total AHI (/hr): 22.6 RDI (/hr): 24.7 OA Index (/hr): 3 CA Index (/hr): 0.9 REM AHI (/hr): 58.9 NREM AHI (/hr): 13.1 Supine AHI (/hr): 46.3 Non-supine AHI (/hr): 16.39 Min O2 Sat (%): 83.00 Mean O2 (%): 93.58 Time below 88% (min): 2.9   Titration  Optimal Pressure (cm): 7 AHI at Optimal Pressure (/hr): 0.0 Min O2 at Optimal Pressure (%): 94.0 Supine % at Optimal (%): 0 Sleep % at Optimal (%): 7    SLEEP ARCHITECTURE The recording time for the entire night was 366.3 minutes.  During a baseline period of 211.0 minutes, the patient slept for 138.3 minutes in REM and nonREM, yielding a sleep efficiency of 65.6%. Sleep onset after lights out was 64.7 minutes with a REM latency of 42.0 minutes. The patient spent 10.70% of the night in stage N1 sleep, 65.80% in stage N2 sleep,  2.89% in stage N3 and 20.61% in REM.  During the titration period of 146.0 minutes, the patient slept for 72.5 minutes in REM and nonREM, yielding a sleep efficiency of 49.7%. Sleep onset after CPAP initiation was 26.4 minutes with a REM latency of 72.0 minutes. The patient spent 10.34% of the night in stage N1 sleep, 76.55% in stage N2 sleep, 0.00% in stage N3 and 13.10% in REM.  CARDIAC DATA The 2 lead EKG demonstrated sinus rhythm. The mean heart rate was 69.96 beats per minute. Other EKG findings include: None.  LEG MOVEMENT DATA The Periodic Limb Movement with Arousal Index was 1.3/ hr.  IMPRESSIONS - Moderate obstructive sleep apnea occurred during the diagnostic portion of the study(AHI = 22.6/hour). An optimal PAP pressure was selected for this patient ( 7 cm of water) - No significant central sleep apnea occurred during the diagnostic portion of the study (CAI = 0.9/hour). - The patient had minimal or no oxygen desaturation during the diagnostic portion of the study (Min O2 = 83.00%) - The patient snored with Loud snoring volume during the diagnostic portion of the study. - No cardiac abnormalities were noted during this study. - Clinically significant periodic limb movements did not occur during sleep.  DIAGNOSIS - Obstructive Sleep Apnea (327.23 [G47.33 ICD-10])  RECOMMENDATIONS - Trial of CPAP therapy on 7 cm H2O with a Medium size Resmed Full Face Mask AirFit F20 mask and heated humidification. - Avoid alcohol, sedatives and other CNS depressants that may worsen sleep apnea and disrupt normal sleep architecture. - Sleep hygiene should be reviewed to assess factors that may improve sleep quality. - Weight management and regular exercise should be initiated or continued.  [  Electronically signed] 08/21/2016 10:31 AM  Baird Lyons MD, ABSM Diplomate, American Board of Sleep Medicine   NPI: [id number]  [place], American Board of Sleep  Medicine  ELECTRONICALLY SIGNED ON:  08/21/2016, 10:28 AM [place] PH: [phone]   FX: [phone] [place]

## 2016-08-25 ENCOUNTER — Telehealth: Payer: Self-pay

## 2016-08-25 MED ORDER — BUPROPION HCL ER (XL) 300 MG PO TB24: 300.0000 mg | ORAL_TABLET | Freq: Every day | ORAL | 0 refills | Status: DC

## 2016-08-26 NOTE — Telephone Encounter (Signed)
error 

## 2016-08-29 ENCOUNTER — Ambulatory Visit (INDEPENDENT_AMBULATORY_CARE_PROVIDER_SITE_OTHER): Payer: BLUE CROSS/BLUE SHIELD | Admitting: Medical

## 2016-08-29 ENCOUNTER — Telehealth: Payer: Self-pay | Admitting: Medical

## 2016-08-29 ENCOUNTER — Encounter: Payer: Self-pay | Admitting: Medical

## 2016-08-29 VITALS — BP 118/80 | HR 97 | Wt 202.0 lb

## 2016-08-29 DIAGNOSIS — R0681 Apnea, not elsewhere classified: Secondary | ICD-10-CM | POA: Diagnosis not present

## 2016-08-29 DIAGNOSIS — R0683 Snoring: Secondary | ICD-10-CM | POA: Diagnosis not present

## 2016-08-29 DIAGNOSIS — I2510 Atherosclerotic heart disease of native coronary artery without angina pectoris: Secondary | ICD-10-CM

## 2016-08-29 DIAGNOSIS — Z87891 Personal history of nicotine dependence: Secondary | ICD-10-CM | POA: Diagnosis not present

## 2016-08-29 DIAGNOSIS — K1120 Sialoadenitis, unspecified: Secondary | ICD-10-CM | POA: Diagnosis not present

## 2016-08-29 DIAGNOSIS — G4733 Obstructive sleep apnea (adult) (pediatric): Secondary | ICD-10-CM

## 2016-08-29 DIAGNOSIS — Z951 Presence of aortocoronary bypass graft: Secondary | ICD-10-CM

## 2016-08-29 DIAGNOSIS — I10 Essential (primary) hypertension: Secondary | ICD-10-CM

## 2016-08-29 DIAGNOSIS — R400 Somnolence: Secondary | ICD-10-CM

## 2016-08-29 DIAGNOSIS — R0600 Dyspnea, unspecified: Secondary | ICD-10-CM | POA: Diagnosis not present

## 2016-08-29 DIAGNOSIS — Z8249 Family history of ischemic heart disease and other diseases of the circulatory system: Secondary | ICD-10-CM | POA: Diagnosis not present

## 2016-08-29 HISTORY — DX: Personal history of nicotine dependence: Z87.891

## 2016-08-29 NOTE — Telephone Encounter (Signed)
Refer to home health for CPAP supplies, new diagnosis of sleep apnea

## 2016-08-29 NOTE — Progress Notes (Signed)
Subjective: Chief Complaint  Patient presents with  . disuss sleep study    discuss sleep study    Here to discuss recent sleep study results.  We discussed getting sleep study in March due to loud snoring ,witnessed apnea, daytime somnolence, fatigue.    He notes that his 47yo daughter is in the hospital at [facility] due to infection of blister of heel she got from rowing.  He notes recently has had intermittent swelling of right jaw in front of ear that goes away the next morning.    He hasn't smoked in 14 months!  No other aggravating or relieving factors. No other complaint.  Past Medical History:  Diagnosis Date  . CAD (coronary artery disease), native coronary artery 06/01/2015   Cath 3/3 80% LAD, 99% diag, 50% circ, RCA 100% with Lto R collaterals  CABG 06/02/15 Dr. Cyndia Bent with LIMA to LAD, free RIMA to Diag and SVG to RCA    . Combined hyperlipidemia   . FH: premature coronary heart disease    Father had MI at age 48   . GERD (gastroesophageal reflux disease)   . History of substance abuse    Pt had abused amphetamines : quit in 2005  . Hypertension 2005  . Impaired fasting blood sugar 2016  . Onychomycosis    Current Outpatient Prescriptions on File Prior to Visit  Medication Sig Dispense Refill  . amLODipine (NORVASC) 2.5 MG tablet Take 1 tablet (2.5 mg total) by mouth daily. 90 tablet 1  . aspirin 325 MG EC tablet Take 81 mg by mouth daily.     Marland Kitchen buPROPion (WELLBUTRIN XL) 300 MG 24 hr tablet Take 1 tablet (300 mg total) by mouth daily. 30 tablet 0  . HYDROcodone-acetaminophen (NORCO) 10-325 MG tablet Take 1 tablet by mouth every 6 (six) hours as needed.    Marland Kitchen lisinopril-hydrochlorothiazide (PRINZIDE,ZESTORETIC) 20-25 MG tablet Take 1 tablet by mouth daily.    . niacin 100 MG tablet Take 100 mg by mouth at bedtime.    . nicotine polacrilex (COMMIT) 4 MG lozenge Take 1 lozenge (4 mg total) by mouth as needed for smoking cessation. (Patient taking differently: Take 2 mg by mouth  as needed for smoking cessation. ) 100 tablet 0  . pantoprazole (PROTONIX) 40 MG tablet Take 1 tablet (40 mg total) by mouth daily. 90 tablet 1  . rosuvastatin (CRESTOR) 20 MG tablet Take 20 mg by mouth daily.     No current facility-administered medications on file prior to visit.    ROS as in subjective  Objective: BP 118/80   Pulse 97   Wt 202 lb (91.6 kg)   SpO2 97%   BMI 29.83 kg/m   Wt Readings from Last 3 Encounters:  08/29/16 202 lb (91.6 kg)  08/15/16 202 lb (91.6 kg)  07/08/16 200 lb (90.7 kg)   Gen: wd, wn, nad HENT unremarkable Oral : no lesions, MMM No facial swelling Ext: no edema Neck : supple, no lymphadenopathy or mass   Assessment: Encounter Diagnoses  Name Primary?  . OSA (obstructive sleep apnea) Yes  . Sialadenitis   . Essential hypertension   . Coronary artery disease involving native coronary artery of native heart without angina pectoris   . Witnessed apneic spells   . Snores   . S/P CABG x 3   . Family history of premature CAD   . Dyspnea, unspecified type   . Daytime somnolence   . Former smoker     Plan: Reviewed his  recent sleep study showing moderate sleep apnea without significant limb movement or desaturation.   Discussed recommendations for weight loss, healthy diet, routine exercise, can elevated head of bed, and discussed sleep hygiene.   We will refer for CPAP.   Plan f/u in 4- 6wk on CPAP  HTN - c/t same medication  sialadenitis - his recent symptoms suggest sialadenitis.  discussed suck on lemon drops or sour candy if this records.   F/u prn. No sign of lymphadenopathy or mass.  Glad to hear he remains free from tobacco   Donald Oliver was seen today for disuss sleep study.  Diagnoses and all orders for this visit:  OSA (obstructive sleep apnea)  Sialadenitis  Essential hypertension  Coronary artery disease involving native coronary artery of native heart without angina pectoris  Witnessed apneic spells  Snores  S/P  CABG x 3  Family history of premature CAD  Dyspnea, unspecified type  Daytime somnolence  Former smoker

## 2016-08-30 NOTE — Telephone Encounter (Signed)
Faxed over referral for CPAP supplies to [facility] @ [phone]

## 2016-09-12 ENCOUNTER — Telehealth: Payer: Self-pay | Admitting: Medical

## 2016-09-12 NOTE — Telephone Encounter (Signed)
pls check into this

## 2016-09-12 NOTE — Telephone Encounter (Signed)
I just refaxed this on last Friday

## 2016-09-12 NOTE — Telephone Encounter (Signed)
  He has not heard anything about CPAP machine  Please call

## 2016-09-15 ENCOUNTER — Telehealth: Payer: Self-pay | Admitting: Family Medicine

## 2016-09-15 NOTE — Telephone Encounter (Signed)
Pt called and states he is having issues getting his CPAP equipment Called Linzie Collin at [facility], he said he will take over this and call pt and set him up for tomorrow as he has openings. Called pt back [phone] advised him of same and gave him Andy's number.

## 2016-09-19 NOTE — Telephone Encounter (Signed)
okay

## 2016-09-25 ENCOUNTER — Other Ambulatory Visit: Payer: Self-pay | Admitting: Medical

## 2016-09-26 NOTE — Telephone Encounter (Signed)
Can he have a refill on this. 

## 2016-10-31 ENCOUNTER — Ambulatory Visit (INDEPENDENT_AMBULATORY_CARE_PROVIDER_SITE_OTHER): Payer: BLUE CROSS/BLUE SHIELD | Admitting: Medical

## 2016-10-31 ENCOUNTER — Other Ambulatory Visit: Payer: Self-pay | Admitting: Medical

## 2016-10-31 ENCOUNTER — Encounter: Payer: Self-pay | Admitting: Medical

## 2016-10-31 VITALS — BP 116/72 | HR 100 | Wt 205.8 lb

## 2016-10-31 DIAGNOSIS — E782 Mixed hyperlipidemia: Secondary | ICD-10-CM | POA: Diagnosis not present

## 2016-10-31 DIAGNOSIS — R7301 Impaired fasting glucose: Secondary | ICD-10-CM | POA: Diagnosis not present

## 2016-10-31 DIAGNOSIS — G4733 Obstructive sleep apnea (adult) (pediatric): Secondary | ICD-10-CM | POA: Diagnosis not present

## 2016-10-31 DIAGNOSIS — I10 Essential (primary) hypertension: Secondary | ICD-10-CM

## 2016-10-31 DIAGNOSIS — E669 Obesity, unspecified: Secondary | ICD-10-CM

## 2016-10-31 DIAGNOSIS — Z9989 Dependence on other enabling machines and devices: Secondary | ICD-10-CM | POA: Diagnosis not present

## 2016-10-31 HISTORY — DX: Obstructive sleep apnea (adult) (pediatric): G47.33

## 2016-10-31 HISTORY — DX: Obstructive sleep apnea (adult) (pediatric): Z99.89

## 2016-10-31 LAB — COMPREHENSIVE METABOLIC PANEL
ALT: 209 U/L — AB (ref 9–46)
AST: 301 U/L — AB (ref 10–40)
Albumin: 4.7 g/dL (ref 3.6–5.1)
Alkaline Phosphatase: 89 U/L (ref 40–115)
BUN: 18 mg/dL (ref 7–25)
CO2: 24 mmol/L (ref 20–32)
CREATININE: 0.89 mg/dL (ref 0.60–1.35)
Calcium: 9.9 mg/dL (ref 8.6–10.3)
Chloride: 103 mmol/L (ref 98–110)
Glucose, Bld: 88 mg/dL (ref 65–99)
Potassium: 4.1 mmol/L (ref 3.5–5.3)
Sodium: 138 mmol/L (ref 135–146)
TOTAL PROTEIN: 7.5 g/dL (ref 6.1–8.1)
Total Bilirubin: 1.7 mg/dL — ABNORMAL HIGH (ref 0.2–1.2)

## 2016-10-31 LAB — CBC
HCT: 46.6 % (ref 38.5–50.0)
Hemoglobin: 15.9 g/dL (ref 13.2–17.1)
MCH: 29.6 pg (ref 27.0–33.0)
MCHC: 34.1 g/dL (ref 32.0–36.0)
MCV: 86.8 fL (ref 80.0–100.0)
MPV: 9.7 fL (ref 7.5–12.5)
PLATELETS: 322 10*3/uL (ref 140–400)
RBC: 5.37 MIL/uL (ref 4.20–5.80)
RDW: 14.1 % (ref 11.0–15.0)
WBC: 8.4 10*3/uL (ref 4.0–10.5)

## 2016-10-31 LAB — LIPID PANEL
Cholesterol: 197 mg/dL (ref <200)
HDL: 35 mg/dL — AB (ref >40)
LDL Cholesterol: 88 mg/dL (ref <100)
TRIGLYCERIDES: 372 mg/dL — AB (ref <150)
Total CHOL/HDL Ratio: 5.6 Ratio — ABNORMAL HIGH (ref <5.0)
VLDL: 74 mg/dL — AB (ref <30)

## 2016-10-31 MED ORDER — NALTREXONE-BUPROPION HCL ER 8-90 MG PO TB12: 2.0000 | ORAL_TABLET | Freq: Two times a day (BID) | ORAL | 1 refills | Status: DC

## 2016-10-31 NOTE — Patient Instructions (Signed)
Begin Contrave weight loss medication.  Start one tablet once daily for a week, then change to 1 tablet twice daily.  Stop your current Wellbutrin as you begin the Contrave as the Contrave contains Wellbutrin  Work on cutting out 500 calories per day in the day or aim for 1700 calories per day intake

## 2016-10-31 NOTE — Progress Notes (Signed)
Subjective: Chief Complaint  Patient presents with  . follow up from cpap    pt is using cpap , no other concerns    Here for f/u on CPAP.  Last visit we referred to CPAP given abnormal sleep study.    Been on CPAP about a month now.  Took several weeks to get the CPAP.  But doing well on CPAP.  Wife notes he is not snoring now and not waking her up.  He feels increase in energy.  Home supplier was [place].      Getting some indigestion of late.   Been getting some gas in abdomen.  Still taking Aspirin 325mg  daily.   Past Medical History:  Diagnosis Date  . CAD (coronary artery disease), native coronary artery 06/01/2015   Cath 3/3 80% LAD, 99% diag, 50% circ, RCA 100% with Lto R collaterals  CABG 06/02/15 Dr. Cyndia Bent with LIMA to LAD, free RIMA to Diag and SVG to RCA    . Combined hyperlipidemia   . FH: premature coronary heart disease    Father had MI at age 84   . GERD (gastroesophageal reflux disease)   . History of substance abuse    Pt had abused amphetamines : quit in 2005  . Hypertension 2005  . Impaired fasting blood sugar 2016  . Onychomycosis    Current Outpatient Prescriptions on File Prior to Visit  Medication Sig Dispense Refill  . amLODipine (NORVASC) 2.5 MG tablet Take 1 tablet (2.5 mg total) by mouth daily. 90 tablet 1  . aspirin 325 MG EC tablet Take 81 mg by mouth daily.     Marland Kitchen lisinopril-hydrochlorothiazide (PRINZIDE,ZESTORETIC) 20-25 MG tablet Take 1 tablet by mouth daily.    . niacin 100 MG tablet Take 100 mg by mouth at bedtime.    . nicotine polacrilex (COMMIT) 4 MG lozenge Take 1 lozenge (4 mg total) by mouth as needed for smoking cessation. (Patient taking differently: Take 2 mg by mouth as needed for smoking cessation. ) 100 tablet 0  . pantoprazole (PROTONIX) 40 MG tablet Take 1 tablet (40 mg total) by mouth daily. 90 tablet 1  . rosuvastatin (CRESTOR) 20 MG tablet Take 20 mg by mouth daily.     No current facility-administered medications on file prior to  visit.    ROS as in subjective   Objective: BP 116/72   Pulse 100   Wt 205 lb 12.8 oz (93.4 kg)   SpO2 98%   BMI 30.39 kg/m   Wt Readings from Last 3 Encounters:  10/31/16 205 lb 12.8 oz (93.4 kg)  08/29/16 202 lb (91.6 kg)  08/15/16 202 lb (91.6 kg)   General appearance: alert, no distress, WD/WN,  Chest: vertical chest surgical scar from CABG Neck: supple, no lymphadenopathy, no thyromegaly, no masses Heart: RRR, normal S1, S2, no murmurs Lungs: CTA bilaterally, no wheezes, rhonchi, or rales Abdomen: +bs, soft, non tender, non distended, no masses, no hepatomegaly, no splenomegaly Pulses: 2+ symmetric, upper and lower extremities, normal cap refill Ext: no edema     Assessment: Encounter Diagnoses  Name Primary?  . Essential hypertension Yes  . Impaired fasting blood sugar   . Combined hyperlipidemia   . OSA on CPAP   . Class 1 obesity with serious comorbidity in adult, unspecified BMI, unspecified obesity type      Plan: HTN - c/t same medication Hyperlipidemia - c/t same medication Labs today C/t CPAP, glad to hear improvements Obesity - work on diet and exercise changes,  begin trial of Contrave. Use Aspirin 1/2 tablet daily of the large supply of 325mg  tablets he has left.   Patient Instructions  Begin Contrave weight loss medication.  Start one tablet once daily for a week, then change to 1 tablet twice daily.  Stop your current Wellbutrin as you begin the Contrave as the Contrave contains Wellbutrin  Work on cutting out 500 calories per day in the day or aim for 1700 calories per day intake    Donald Oliver was seen today for follow up from cpap.  Diagnoses and all orders for this visit:  Essential hypertension -     Comprehensive metabolic panel -     Lipid panel -     CBC -     Hemoglobin A1c  Impaired fasting blood sugar -     Comprehensive metabolic panel -     Lipid panel -     CBC -     Hemoglobin A1c  Combined hyperlipidemia -      Comprehensive metabolic panel -     Lipid panel -     CBC -     Hemoglobin A1c  OSA on CPAP -     Comprehensive metabolic panel -     Lipid panel -     CBC -     Hemoglobin A1c  Class 1 obesity with serious comorbidity in adult, unspecified BMI, unspecified obesity type  Other orders -     Naltrexone-Bupropion HCl ER 8-90 MG TB12; Take 2 tablets by mouth 2 (two) times daily with a meal.

## 2016-11-01 LAB — HEMOGLOBIN A1C
Hgb A1c MFr Bld: 5.6 % (ref <5.7)
Mean Plasma Glucose: 114 mg/dL

## 2016-11-02 ENCOUNTER — Encounter: Payer: Self-pay | Admitting: Medical

## 2016-11-02 ENCOUNTER — Other Ambulatory Visit: Payer: Self-pay | Admitting: Medical

## 2016-11-02 DIAGNOSIS — R945 Abnormal results of liver function studies: Principal | ICD-10-CM

## 2016-11-02 DIAGNOSIS — R7989 Other specified abnormal findings of blood chemistry: Secondary | ICD-10-CM

## 2016-11-02 LAB — CK: Total CK: 133 U/L (ref 44–196)

## 2016-11-03 LAB — HEPATITIS PANEL, ACUTE
HCV Ab: NONREACTIVE
HEP A IGM: NONREACTIVE
HEP B C IGM: NONREACTIVE
HEP B S AG: NONREACTIVE

## 2016-11-09 ENCOUNTER — Other Ambulatory Visit: Payer: BLUE CROSS/BLUE SHIELD

## 2016-11-10 ENCOUNTER — Ambulatory Visit
Admission: RE | Admit: 2016-11-10 | Discharge: 2016-11-10 | Disposition: A | Discharge status: Home / Self Care | Payer: BLUE CROSS/BLUE SHIELD | Source: Ambulatory Visit | Attending: Medical | Admitting: Medical

## 2016-11-10 DIAGNOSIS — R945 Abnormal results of liver function studies: Principal | ICD-10-CM

## 2016-11-10 DIAGNOSIS — R7989 Other specified abnormal findings of blood chemistry: Secondary | ICD-10-CM

## 2016-11-14 ENCOUNTER — Telehealth: Payer: Self-pay | Admitting: Medical

## 2016-11-14 NOTE — Telephone Encounter (Signed)
P.A. CONTRAVE  

## 2016-11-19 NOTE — Telephone Encounter (Signed)
PA Approved

## 2016-11-21 NOTE — Telephone Encounter (Signed)
Pt informed, faxed pharmacy  °

## 2016-11-24 ENCOUNTER — Encounter: Payer: Self-pay | Admitting: Medical

## 2016-12-07 ENCOUNTER — Other Ambulatory Visit: Payer: Self-pay | Admitting: Medical

## 2016-12-08 ENCOUNTER — Other Ambulatory Visit: Payer: Self-pay | Admitting: Medical

## 2016-12-08 DIAGNOSIS — I2510 Atherosclerotic heart disease of native coronary artery without angina pectoris: Secondary | ICD-10-CM | POA: Diagnosis not present

## 2016-12-08 DIAGNOSIS — E784 Other hyperlipidemia: Secondary | ICD-10-CM | POA: Diagnosis not present

## 2016-12-08 DIAGNOSIS — I10 Essential (primary) hypertension: Secondary | ICD-10-CM | POA: Diagnosis not present

## 2016-12-09 ENCOUNTER — Other Ambulatory Visit: Payer: Self-pay

## 2016-12-12 DIAGNOSIS — L910 Hypertrophic scar: Secondary | ICD-10-CM | POA: Diagnosis not present

## 2016-12-22 DIAGNOSIS — E784 Other hyperlipidemia: Secondary | ICD-10-CM | POA: Diagnosis not present

## 2016-12-22 DIAGNOSIS — G4733 Obstructive sleep apnea (adult) (pediatric): Secondary | ICD-10-CM | POA: Diagnosis not present

## 2016-12-22 DIAGNOSIS — I2510 Atherosclerotic heart disease of native coronary artery without angina pectoris: Secondary | ICD-10-CM | POA: Diagnosis not present

## 2017-01-21 DIAGNOSIS — G4733 Obstructive sleep apnea (adult) (pediatric): Secondary | ICD-10-CM | POA: Diagnosis not present

## 2017-01-24 DIAGNOSIS — Z23 Encounter for immunization: Secondary | ICD-10-CM | POA: Diagnosis not present

## 2017-01-24 DIAGNOSIS — G4733 Obstructive sleep apnea (adult) (pediatric): Secondary | ICD-10-CM | POA: Diagnosis not present

## 2017-01-24 DIAGNOSIS — E7800 Pure hypercholesterolemia, unspecified: Secondary | ICD-10-CM | POA: Diagnosis not present

## 2017-01-24 DIAGNOSIS — I10 Essential (primary) hypertension: Secondary | ICD-10-CM | POA: Diagnosis not present

## 2017-01-24 DIAGNOSIS — Z6830 Body mass index (BMI) 30.0-30.9, adult: Secondary | ICD-10-CM | POA: Diagnosis not present

## 2017-01-24 DIAGNOSIS — R5381 Other malaise: Secondary | ICD-10-CM | POA: Diagnosis not present

## 2017-01-24 DIAGNOSIS — Z1389 Encounter for screening for other disorder: Secondary | ICD-10-CM | POA: Diagnosis not present

## 2017-01-25 DIAGNOSIS — Z79899 Other long term (current) drug therapy: Secondary | ICD-10-CM | POA: Diagnosis not present

## 2017-01-25 DIAGNOSIS — E7800 Pure hypercholesterolemia, unspecified: Secondary | ICD-10-CM | POA: Diagnosis not present

## 2017-01-25 DIAGNOSIS — R5383 Other fatigue: Secondary | ICD-10-CM | POA: Diagnosis not present

## 2017-01-25 DIAGNOSIS — I10 Essential (primary) hypertension: Secondary | ICD-10-CM | POA: Diagnosis not present

## 2017-01-27 DIAGNOSIS — E291 Testicular hypofunction: Secondary | ICD-10-CM | POA: Diagnosis not present

## 2017-02-08 ENCOUNTER — Telehealth: Payer: Self-pay | Admitting: Family Medicine

## 2017-02-08 ENCOUNTER — Other Ambulatory Visit: Payer: Self-pay

## 2017-02-08 MED ORDER — NALTREXONE-BUPROPION HCL ER 8-90 MG PO TB12: 2.0000 | ORAL_TABLET | Freq: Two times a day (BID) | ORAL | 1 refills | Status: DC

## 2017-02-08 NOTE — Telephone Encounter (Signed)
Call it out or send

## 2017-02-08 NOTE — Telephone Encounter (Signed)
[facility] called and stated they can't send this request electronically.

## 2017-02-08 NOTE — Telephone Encounter (Signed)
[facility] request Bupropion HCL XL tabs 300 mg  90 day supply

## 2017-02-09 ENCOUNTER — Other Ambulatory Visit: Payer: Self-pay | Admitting: Internal Medicine

## 2017-02-09 DIAGNOSIS — E291 Testicular hypofunction: Secondary | ICD-10-CM

## 2017-02-09 NOTE — Telephone Encounter (Signed)
done

## 2017-02-24 ENCOUNTER — Ambulatory Visit
Admission: RE | Admit: 2017-02-24 | Discharge: 2017-02-24 | Disposition: A | Discharge status: Home / Self Care | Payer: BLUE CROSS/BLUE SHIELD | Source: Ambulatory Visit | Attending: Internal Medicine | Admitting: Internal Medicine

## 2017-02-24 DIAGNOSIS — E291 Testicular hypofunction: Secondary | ICD-10-CM

## 2017-02-24 MED ORDER — GADOBENATE DIMEGLUMINE 529 MG/ML IV SOLN
10.0000 mL | Freq: Once | INTRAVENOUS | Status: AC | PRN
  Administered 2017-02-24: 10 mL via INTRAVENOUS

## 2017-03-20 ENCOUNTER — Other Ambulatory Visit: Payer: Self-pay | Admitting: Medical

## 2017-03-23 DIAGNOSIS — G4733 Obstructive sleep apnea (adult) (pediatric): Secondary | ICD-10-CM | POA: Diagnosis not present

## 2017-04-19 ENCOUNTER — Telehealth: Payer: Self-pay

## 2017-04-19 NOTE — Telephone Encounter (Signed)
Pt was called to schedule an appointment. Pt says that he really needs to see a MD. Pt was made aware that both MDs are not taking new pt at this time . Thanks [employer]

## 2017-04-19 NOTE — Telephone Encounter (Signed)
I have enjoyed seeing him as a patient.  If he feels he needs to see another provider or if he feels my care is not appropriate, then he should do what he feels he needs to do.   If another doctor of his advised him to see a MD, I would ask that he ask that doctor to give me a call and discuss their concerns with my care and decision making.   Thanks [employer]

## 2017-04-19 NOTE — Telephone Encounter (Signed)
Pt says another doctor ( think it was his cardiologist) the he has a lot of medical issues and needs to see a MD. Bonne Dolores to make him an appt.  And pt says he will have to find another doctor. Thanks [employer]

## 2017-04-19 NOTE — Telephone Encounter (Signed)
error 

## 2017-04-23 DIAGNOSIS — G4733 Obstructive sleep apnea (adult) (pediatric): Secondary | ICD-10-CM | POA: Diagnosis not present

## 2017-04-24 NOTE — Telephone Encounter (Signed)
Spoke to pt and pt was saying he will consider still keeping you as his pcp... Thanks [employer]

## 2017-05-11 DIAGNOSIS — F331 Major depressive disorder, recurrent, moderate: Secondary | ICD-10-CM | POA: Diagnosis not present

## 2017-05-11 DIAGNOSIS — Z6830 Body mass index (BMI) 30.0-30.9, adult: Secondary | ICD-10-CM | POA: Diagnosis not present

## 2017-05-11 DIAGNOSIS — G479 Sleep disorder, unspecified: Secondary | ICD-10-CM | POA: Diagnosis not present

## 2017-05-18 DIAGNOSIS — Z630 Problems in relationship with spouse or partner: Secondary | ICD-10-CM | POA: Diagnosis not present

## 2017-05-18 DIAGNOSIS — F331 Major depressive disorder, recurrent, moderate: Secondary | ICD-10-CM | POA: Diagnosis not present

## 2017-05-23 DIAGNOSIS — Z630 Problems in relationship with spouse or partner: Secondary | ICD-10-CM | POA: Diagnosis not present

## 2017-05-23 DIAGNOSIS — F331 Major depressive disorder, recurrent, moderate: Secondary | ICD-10-CM | POA: Diagnosis not present

## 2017-05-24 DIAGNOSIS — G4733 Obstructive sleep apnea (adult) (pediatric): Secondary | ICD-10-CM | POA: Diagnosis not present

## 2017-06-07 DIAGNOSIS — F331 Major depressive disorder, recurrent, moderate: Secondary | ICD-10-CM | POA: Diagnosis not present

## 2017-06-15 DIAGNOSIS — F331 Major depressive disorder, recurrent, moderate: Secondary | ICD-10-CM | POA: Diagnosis not present

## 2017-06-19 ENCOUNTER — Ambulatory Visit: Payer: BLUE CROSS/BLUE SHIELD | Admitting: Medical

## 2017-06-19 DIAGNOSIS — F331 Major depressive disorder, recurrent, moderate: Secondary | ICD-10-CM | POA: Diagnosis not present

## 2017-06-19 DIAGNOSIS — F418 Other specified anxiety disorders: Secondary | ICD-10-CM | POA: Diagnosis not present

## 2017-06-21 DIAGNOSIS — G4733 Obstructive sleep apnea (adult) (pediatric): Secondary | ICD-10-CM | POA: Diagnosis not present

## 2017-06-22 DIAGNOSIS — F331 Major depressive disorder, recurrent, moderate: Secondary | ICD-10-CM | POA: Diagnosis not present

## 2017-06-26 DIAGNOSIS — F331 Major depressive disorder, recurrent, moderate: Secondary | ICD-10-CM | POA: Diagnosis not present

## 2017-06-26 DIAGNOSIS — F418 Other specified anxiety disorders: Secondary | ICD-10-CM | POA: Diagnosis not present

## 2017-07-06 DIAGNOSIS — F331 Major depressive disorder, recurrent, moderate: Secondary | ICD-10-CM | POA: Diagnosis not present

## 2017-07-06 DIAGNOSIS — L910 Hypertrophic scar: Secondary | ICD-10-CM | POA: Diagnosis not present

## 2017-07-06 DIAGNOSIS — F418 Other specified anxiety disorders: Secondary | ICD-10-CM | POA: Diagnosis not present

## 2017-07-17 DIAGNOSIS — F331 Major depressive disorder, recurrent, moderate: Secondary | ICD-10-CM | POA: Diagnosis not present

## 2017-07-17 DIAGNOSIS — F418 Other specified anxiety disorders: Secondary | ICD-10-CM | POA: Diagnosis not present

## 2017-07-24 DIAGNOSIS — E668 Other obesity: Secondary | ICD-10-CM | POA: Diagnosis not present

## 2017-07-24 DIAGNOSIS — H538 Other visual disturbances: Secondary | ICD-10-CM | POA: Diagnosis not present

## 2017-07-24 DIAGNOSIS — R7302 Impaired glucose tolerance (oral): Secondary | ICD-10-CM | POA: Diagnosis not present

## 2017-07-24 DIAGNOSIS — E291 Testicular hypofunction: Secondary | ICD-10-CM | POA: Diagnosis not present

## 2017-07-24 DIAGNOSIS — I10 Essential (primary) hypertension: Secondary | ICD-10-CM | POA: Diagnosis not present

## 2017-08-07 DIAGNOSIS — G5791 Unspecified mononeuropathy of right lower limb: Secondary | ICD-10-CM | POA: Diagnosis not present

## 2017-08-07 DIAGNOSIS — M19071 Primary osteoarthritis, right ankle and foot: Secondary | ICD-10-CM | POA: Diagnosis not present

## 2017-08-07 DIAGNOSIS — L910 Hypertrophic scar: Secondary | ICD-10-CM | POA: Diagnosis not present

## 2017-08-07 DIAGNOSIS — G5761 Lesion of plantar nerve, right lower limb: Secondary | ICD-10-CM | POA: Diagnosis not present

## 2017-08-07 DIAGNOSIS — F331 Major depressive disorder, recurrent, moderate: Secondary | ICD-10-CM | POA: Diagnosis not present

## 2017-08-07 DIAGNOSIS — F418 Other specified anxiety disorders: Secondary | ICD-10-CM | POA: Diagnosis not present

## 2017-08-08 DIAGNOSIS — M25552 Pain in left hip: Secondary | ICD-10-CM | POA: Diagnosis not present

## 2017-08-08 DIAGNOSIS — M9905 Segmental and somatic dysfunction of pelvic region: Secondary | ICD-10-CM | POA: Diagnosis not present

## 2017-08-08 DIAGNOSIS — M545 Low back pain: Secondary | ICD-10-CM | POA: Diagnosis not present

## 2017-08-08 DIAGNOSIS — M9903 Segmental and somatic dysfunction of lumbar region: Secondary | ICD-10-CM | POA: Diagnosis not present

## 2017-08-25 DIAGNOSIS — F418 Other specified anxiety disorders: Secondary | ICD-10-CM | POA: Diagnosis not present

## 2017-08-25 DIAGNOSIS — F331 Major depressive disorder, recurrent, moderate: Secondary | ICD-10-CM | POA: Diagnosis not present

## 2017-09-08 DIAGNOSIS — F418 Other specified anxiety disorders: Secondary | ICD-10-CM | POA: Diagnosis not present

## 2017-09-08 DIAGNOSIS — F331 Major depressive disorder, recurrent, moderate: Secondary | ICD-10-CM | POA: Diagnosis not present

## 2017-09-12 DIAGNOSIS — H04123 Dry eye syndrome of bilateral lacrimal glands: Secondary | ICD-10-CM | POA: Diagnosis not present

## 2017-09-22 DIAGNOSIS — E291 Testicular hypofunction: Secondary | ICD-10-CM | POA: Diagnosis not present

## 2017-10-03 DIAGNOSIS — G4733 Obstructive sleep apnea (adult) (pediatric): Secondary | ICD-10-CM | POA: Diagnosis not present

## 2017-10-10 DIAGNOSIS — E785 Hyperlipidemia, unspecified: Secondary | ICD-10-CM | POA: Diagnosis not present

## 2017-10-10 DIAGNOSIS — I2510 Atherosclerotic heart disease of native coronary artery without angina pectoris: Secondary | ICD-10-CM | POA: Diagnosis not present

## 2017-10-10 DIAGNOSIS — F331 Major depressive disorder, recurrent, moderate: Secondary | ICD-10-CM | POA: Diagnosis not present

## 2017-10-10 DIAGNOSIS — L910 Hypertrophic scar: Secondary | ICD-10-CM | POA: Diagnosis not present

## 2017-10-10 DIAGNOSIS — E668 Other obesity: Secondary | ICD-10-CM | POA: Diagnosis not present

## 2017-10-10 DIAGNOSIS — L821 Other seborrheic keratosis: Secondary | ICD-10-CM | POA: Diagnosis not present

## 2017-10-10 DIAGNOSIS — F418 Other specified anxiety disorders: Secondary | ICD-10-CM | POA: Diagnosis not present

## 2017-10-10 DIAGNOSIS — I10 Essential (primary) hypertension: Secondary | ICD-10-CM | POA: Diagnosis not present

## 2018-02-15 DIAGNOSIS — E291 Testicular hypofunction: Secondary | ICD-10-CM | POA: Diagnosis not present

## 2018-02-15 DIAGNOSIS — Z630 Problems in relationship with spouse or partner: Secondary | ICD-10-CM | POA: Diagnosis not present

## 2018-02-15 DIAGNOSIS — R7302 Impaired glucose tolerance (oral): Secondary | ICD-10-CM | POA: Diagnosis not present

## 2018-02-27 ENCOUNTER — Telehealth: Payer: Self-pay | Admitting: Medical

## 2018-02-27 NOTE — Telephone Encounter (Signed)
  Please call  Re CPAP machine supplies He is working with [facility] and they are needing information

## 2018-02-28 NOTE — Telephone Encounter (Signed)
FYI - he transferred to another PCP, and he or his other doctor wanted him to have a different PCP.

## 2018-02-28 NOTE — Telephone Encounter (Signed)
Patient called wanting to get ov notes that state he has sleep apnea for CPAP supplies.  Patient states that he goes to another doctor and he was giving him a hard time.   Patient states that he needs supplies and is working with [facility] and they need recent notes.   I printed off sleep study from 2018 and mailed it to him.   I told he would have to be seen here for documentation or referral for CPAP supplies.   Patient states that he was going to call his insurance co to check to see if he can make an appointment with you.

## 2018-03-23 DIAGNOSIS — G4733 Obstructive sleep apnea (adult) (pediatric): Secondary | ICD-10-CM | POA: Diagnosis not present

## 2018-05-24 ENCOUNTER — Encounter: Payer: Self-pay | Admitting: Medical

## 2018-05-24 ENCOUNTER — Ambulatory Visit (INDEPENDENT_AMBULATORY_CARE_PROVIDER_SITE_OTHER): Payer: Self-pay | Admitting: Medical

## 2018-05-24 VITALS — BP 120/80 | HR 92 | Temp 98.0°F | Resp 16 | Ht 69.0 in | Wt 191.6 lb

## 2018-05-24 DIAGNOSIS — R945 Abnormal results of liver function studies: Secondary | ICD-10-CM

## 2018-05-24 DIAGNOSIS — I2510 Atherosclerotic heart disease of native coronary artery without angina pectoris: Secondary | ICD-10-CM

## 2018-05-24 DIAGNOSIS — Z951 Presence of aortocoronary bypass graft: Secondary | ICD-10-CM

## 2018-05-24 DIAGNOSIS — Z9989 Dependence on other enabling machines and devices: Secondary | ICD-10-CM

## 2018-05-24 DIAGNOSIS — I10 Essential (primary) hypertension: Secondary | ICD-10-CM

## 2018-05-24 DIAGNOSIS — R5383 Other fatigue: Secondary | ICD-10-CM

## 2018-05-24 DIAGNOSIS — G4733 Obstructive sleep apnea (adult) (pediatric): Secondary | ICD-10-CM

## 2018-05-24 DIAGNOSIS — R7989 Other specified abnormal findings of blood chemistry: Secondary | ICD-10-CM | POA: Insufficient documentation

## 2018-05-24 DIAGNOSIS — K760 Fatty (change of) liver, not elsewhere classified: Secondary | ICD-10-CM

## 2018-05-24 DIAGNOSIS — E782 Mixed hyperlipidemia: Secondary | ICD-10-CM

## 2018-05-24 HISTORY — DX: Other specified abnormal findings of blood chemistry: R79.89

## 2018-05-24 HISTORY — DX: Other fatigue: R53.83

## 2018-05-24 HISTORY — DX: Fatty (change of) liver, not elsewhere classified: K76.0

## 2018-05-24 MED ORDER — FLUOXETINE HCL 20 MG PO TABS: 20.0000 mg | ORAL_TABLET | Freq: Every day | ORAL | 1 refills | Status: DC

## 2018-05-24 NOTE — Patient Instructions (Signed)
You are currently taking Wellbutrin 300mg  + 150mg  daily Wean down as follows: Stop the 300mg  tablet now, but continue 150mg  tablet for 2 weeks. After 2 weeks call back so we can decide to adjust or not.  In the meantime, begin Fluoxetine 20mg , 1 tablet daily  We will request records from prior doctor and labs so we can decide on testosterone therapy

## 2018-05-24 NOTE — Progress Notes (Signed)
Subjective:     Patient ID: Donald Oliver, male   DOB: 07-24-1970, 48 y.o.   MRN: [id number]  HPI Chief Complaint  Patient presents with  . med check    med check non fasting   Here for med check.  Last visit > 1 year ago.  A lot has changed since the last time I saw him.  Of note when he last saw me, he expressed that he had rather see an medical doctor MD and said that 1 of his other doctors advised him to the same.  Thus I am surprised to see him back  The doctor he established with in the meantime, he says he did not feel comfortable with, and did not feel they were doing a good job.  He reports that he also found in the meantime that his wife was cheating on him so they split up.  He has went through some depression this past year, got sick, had gained weight, was not having any energy, no muscle tone, testicles had shrunk.  He had his testosterone checked and it was  down to 73.   Was put on Androgel.   Was doing ok on this.   Now switched jobs, and Androgel wasn't covered.  He was using Testim for period time but that did not work well at all.  In the meantime he lost his job and is currently unemployed and does not have insurance  He does report some current fatigue, notes his testosterone is low, would like to go back on AndroGel.  He has been taking Wellbutrin 450 mg daily with does not feel right on this medicine would like to try something else.  Feels down at times, knows he needs to be on something to help with mood.  Currently not seeing mental health professional.  Denies suicidal or homicidal ideation.  Reports being compliant with his blood pressure medication.  Went to Dr. Wynonia Oliver this past August.  He is currently taking Crestor.  He may return here in September 2018 to discuss liver issues and never saw a gastroenterologist.  He reports that his liver tests were okay when he was seen in the other doctor in the past year  Denies using alcohol currently.  Exercise was  fine, diet was okay, could be better  Past Medical History:  Diagnosis Date  . CAD (coronary artery disease), native coronary artery 06/01/2015   Cath 3/3 80% LAD, 99% diag, 50% circ, RCA 100% with Lto R collaterals  CABG 06/02/15 Dr. Cyndia Bent with LIMA to LAD, free RIMA to Diag and SVG to RCA    . Combined hyperlipidemia   . FH: premature coronary heart disease    Father had MI at age 38   . GERD (gastroesophageal reflux disease)   . History of substance abuse ([place])    Pt had abused amphetamines : quit in 2005  . Hypertension 2005  . Impaired fasting blood sugar 2016  . Onychomycosis    Current Outpatient Medications on File Prior to Visit  Medication Sig Dispense Refill  . amLODipine (NORVASC) 2.5 MG tablet TAKE 1 TABLET DAILY 90 tablet 0  . aspirin 81 MG tablet Take 81 mg by mouth daily.    Marland Kitchen lisinopril-hydrochlorothiazide (PRINZIDE,ZESTORETIC) 20-25 MG tablet Take 1 tablet by mouth daily.    . pantoprazole (PROTONIX) 40 MG tablet TAKE 1 TABLET DAILY 90 tablet 0  . rosuvastatin (CRESTOR) 20 MG tablet Take 20 mg by mouth daily.    . niacin  100 MG tablet Take 100 mg by mouth at bedtime.    . nicotine polacrilex (COMMIT) 4 MG lozenge Take 1 lozenge (4 mg total) by mouth as needed for smoking cessation. (Patient not taking: Reported on 05/24/2018) 100 tablet 0   No current facility-administered medications on file prior to visit.      Review of Systems ROS as in subjective      Objective:   Physical Exam  BP 120/80   Pulse 92   Temp 98 F (36.7 C) (Oral)   Resp 16   Ht 5\' 9"  (1.753 m)   Wt 191 lb 9.6 oz (86.9 kg)   SpO2 97%   BMI 28.29 kg/m   General appearance: alert, no distress, WD/WN,  Neck: supple, no lymphadenopathy, no thyromegaly, no masses Heart: RRR, normal S1, S2, no murmurs Lungs: CTA bilaterally, no wheezes, rhonchi, or rales Abdomen: +bs, soft, non tender, non distended, no masses, no hepatomegaly, no splenomegaly Pulses: 2+ symmetric, upper and lower  extremities, normal cap refill Ext: no edema Psych: pleasant, good eye contact, answers questions appropriately     Assessment:     Encounter Diagnoses  Name Primary?  . Essential hypertension Yes  . Coronary artery disease involving native coronary artery of native heart without angina pectoris   . Combined hyperlipidemia   . OSA on CPAP   . S/P CABG x 3   . Low testosterone in male   . Fatigue, unspecified type   . Elevated LFTs   . Fatty liver disease, nonalcoholic        Plan:     Hypertension-continue same medication, and he voices that he does not need refills at this time  Elevated liver test- he declines labs today, so we will request prior records from his other recent doctor.  I advised that he may still need to see gastroenterology based on the findings we had back in 2018 which was fatty liver on ultrasound, elevated liver test.  I advise he not use cholesterol medicine at this time until we have an answer on this.  Depressed mood-I gave him instructions to wean down to 150 mg of Wellbutrin as he starts fluoxetine.  Discussed risk and benefits of duloxetine, proper use.  Call or return in the meantime with concerns or questions  Sleep apnea-continue CPAP  Hyperlipidemia-advised he hold off on cholesterol medicine until I have labs to verify liver tests are normal  Low testosterone-we will request prior records before initiating any medication.  He reports AndroGel generic is $50 with a coupon card and we also discussed possibly using customized cream through [facility] if need be   Donald Oliver was seen today for med check.  Diagnoses and all orders for this visit:  Essential hypertension  Coronary artery disease involving native coronary artery of native heart without angina pectoris  Combined hyperlipidemia  OSA on CPAP  S/P CABG x 3  Low testosterone in male  Fatigue, unspecified type  Elevated LFTs  Fatty liver disease, nonalcoholic  Other  orders -     FLUoxetine (PROZAC) 20 MG tablet; Take 1 tablet (20 mg total) by mouth daily.  Await records

## 2018-06-01 ENCOUNTER — Other Ambulatory Visit: Payer: Self-pay | Admitting: Medical

## 2018-06-01 ENCOUNTER — Telehealth: Payer: Self-pay | Admitting: Medical

## 2018-06-01 ENCOUNTER — Encounter: Payer: Self-pay | Admitting: Cardiology

## 2018-06-01 ENCOUNTER — Ambulatory Visit (INDEPENDENT_AMBULATORY_CARE_PROVIDER_SITE_OTHER): Payer: Self-pay | Admitting: Cardiology

## 2018-06-01 VITALS — BP 142/76 | HR 98 | Ht 69.0 in | Wt 193.0 lb

## 2018-06-01 DIAGNOSIS — E782 Mixed hyperlipidemia: Secondary | ICD-10-CM

## 2018-06-01 DIAGNOSIS — I2510 Atherosclerotic heart disease of native coronary artery without angina pectoris: Secondary | ICD-10-CM

## 2018-06-01 DIAGNOSIS — I10 Essential (primary) hypertension: Secondary | ICD-10-CM

## 2018-06-01 DIAGNOSIS — Z87891 Personal history of nicotine dependence: Secondary | ICD-10-CM

## 2018-06-01 MED ORDER — METOPROLOL SUCCINATE ER 25 MG PO TB24: 25.0000 mg | ORAL_TABLET | Freq: Every day | ORAL | 1 refills | Status: DC

## 2018-06-01 MED ORDER — NITROGLYCERIN 0.4 MG SL SUBL: 0.4000 mg | SUBLINGUAL_TABLET | SUBLINGUAL | 3 refills | Status: DC | PRN

## 2018-06-01 MED ORDER — FLUOXETINE HCL 20 MG PO TABS: 20.0000 mg | ORAL_TABLET | Freq: Every day | ORAL | 1 refills | Status: DC

## 2018-06-01 NOTE — Addendum Note (Signed)
Addended by: Tarri Glenn on: 06/01/2018 03:35 PM   Modules accepted: Orders

## 2018-06-01 NOTE — Progress Notes (Signed)
Cardiology Office Note:    Date:  06/01/2018   ID:  Donald Oliver, DOB September 08, 1970, MRN [id number]  PCP:  Carlena Hurl, PA-C  Cardiologist:  Jenean Lindau, MD   Referring MD: Carlena Hurl, PA-C    ASSESSMENT:    1. Coronary artery disease involving native coronary artery of native heart without angina pectoris   2. Combined hyperlipidemia   3. Essential hypertension   4. Former smoker    PLAN:    In order of problems listed above:  1. Secondary prevention stressed with the patient.  Importance of compliance with diet and medication stressed and he vocalized understanding.  His blood pressure is stable.  Diet was discussed for dyslipidemia.  Importance of regular exercise stressed. 2. He is fasting and will have blood work today including lipids 3. Sublingual nitroglycerin prescription was sent, its protocol and 911 protocol explained and the patient vocalized understanding questions were answered to the patient's satisfaction 4. In view of his evaluation and for driving purposes I will do exercise stress echo.  If this is negative then he should be cleared for the same.  This is from a cardiac standpoint. 5. Patient will be seen in follow-up appointment in 6 months or earlier if the patient has any concerns    Medication Adjustments/Labs and Tests Ordered: Current medicines are reviewed at length with the patient today.  Concerns regarding medicines are outlined above.  No orders of the defined types were placed in this encounter.  No orders of the defined types were placed in this encounter.    No chief complaint on file.    History of Present Illness:    Donald Oliver is a 48 y.o. male.  He is a patient of Dr. Wynonia Lawman and he is here to be established.  He also wants clearance to drive.  He has had bypass surgery in the past.  He denies any problems at this time.  He has known coronary artery disease, essential hypertension and dyslipidemia.  At the time of my  evaluation, the patient is alert awake oriented and in no distress.  He is an active gentleman.  Past Medical History:  Diagnosis Date  . CAD (coronary artery disease), native coronary artery 06/01/2015   Cath 3/3 80% LAD, 99% diag, 50% circ, RCA 100% with Lto R collaterals  CABG 06/02/15 Dr. Cyndia Bent with LIMA to LAD, free RIMA to Diag and SVG to RCA    . Combined hyperlipidemia   . FH: premature coronary heart disease    Father had MI at age 18   . GERD (gastroesophageal reflux disease)   . History of substance abuse ([place])    Pt had abused amphetamines : quit in 2005  . Hypertension 2005  . Impaired fasting blood sugar 2016  . Onychomycosis     Past Surgical History:  Procedure Laterality Date  . CARDIAC CATHETERIZATION N/A 05/29/2015   Procedure: Left Heart Cath and Coronary Angiography;  Surgeon: Jettie Booze, MD;  Location: [facility];  Service: Cardiovascular;  Laterality: N/A;  . CARDIAC CATHETERIZATION  05/29/2015   Procedure: Coronary Balloon Angioplasty;  Surgeon: Jettie Booze, MD;  Location: [facility];  Service: Cardiovascular;;  . CORONARY ARTERY BYPASS GRAFT N/A 06/02/2015   Procedure: CORONARY ARTERY BYPASS GRAFTING (CABG) x  3 utilizing bilateral internal mammary artery and endoscopically harvested right sapheneous vein.;  Surgeon: [name], MD;  Location: [facility];  Service: Open Heart Surgery;  Laterality: N/A;  . RHINOPLASTY  [id number]   w/repair of nasal fractuare; done at [facility]   . TEE WITHOUT CARDIOVERSION N/A 06/02/2015   Procedure: TRANSESOPHAGEAL ECHOCARDIOGRAM (TEE);  Surgeon: [name], MD;  Location: [facility];  Service: Open Heart Surgery;  Laterality: N/A;    Current Medications: Current Meds  Medication Sig  . amLODipine (NORVASC) 2.5 MG tablet TAKE 1 TABLET DAILY  . aspirin 81 MG tablet Take 81 mg by mouth daily.  Marland Kitchen FLUoxetine (PROZAC) 20 MG tablet Take 1 tablet (20 mg total) by mouth daily.  Marland Kitchen lisinopril-hydrochlorothiazide  (PRINZIDE,ZESTORETIC) 20-25 MG tablet Take 1 tablet by mouth daily.  . pantoprazole (PROTONIX) 40 MG tablet TAKE 1 TABLET DAILY  . rosuvastatin (CRESTOR) 20 MG tablet Take 20 mg by mouth daily.     Allergies:   Amoxicillin   Social History   Socioeconomic History  . Marital status: Married    Spouse name: Not on file  . Number of children: 3  . Years of education: 35   . Highest education level: Not on file  Occupational History  . Occupation: Sports coach    Comment: unemployed   Social Needs  . Financial resource strain: Not on file  . Food insecurity:    Worry: Not on file    Inability: Not on file  . Transportation needs:    Medical: Not on file    Non-medical: Not on file  Tobacco Use  . Smoking status: Former Smoker    Packs/day: 1.00    Years: 30.00    Pack years: 30.00  . Smokeless tobacco: Never Used  . Tobacco comment: Uses nicotene losanges  Substance and Sexual Activity  . Alcohol use: Yes    Alcohol/week: 3.0 standard drinks    Types: 3 Shots of liquor per week  . Drug use: Yes    Frequency: 1.0 times per week    Types: Marijuana    Comment: 05/29/2015 "none in the last 10 years"  . Sexual activity: Yes  Lifestyle  . Physical activity:    Days per week: Not on file    Minutes per session: Not on file  . Stress: Not on file  Relationships  . Social connections:    Talks on phone: Not on file    Gets together: Not on file    Attends religious service: Not on file    Active member of club or organization: Not on file    Attends meetings of clubs or organizations: Not on file    Relationship status: Not on file  Other Topics Concern  . Not on file  Social History Narrative   Married has 3 girls ages 12yo, 49yo, 49yo, works at [employer], walks several miles daily at work, hx/o substance abuse in the past     Family History: The patient's family history includes Diabetes in his father; HIV in his sister; Heart disease (age of  onset: 38) in his father; Heart failure in his father.  ROS:   Please see the history of present illness.    All other systems reviewed and are negative.  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  Recent Lipid Panel    Component Value Date/Time   CHOL 197 10/31/2016 1500   TRIG 372 (H) 10/31/2016 1500   HDL 35 (  L) 10/31/2016 1500   CHOLHDL 5.6 (H) 10/31/2016 1500   VLDL 74 (H) 10/31/2016 1500   LDLCALC 88 10/31/2016 1500    Physical Exam:    VS:  BP (!) 142/76 (BP Location: Right Arm, Patient Position: Sitting, Cuff Size: Normal)   Pulse 98   Ht 5\' 9"  (1.753 m)   Wt 193 lb (87.5 kg)   SpO2 99%   BMI 28.50 kg/m     Wt Readings from Last 3 Encounters:  06/01/18 193 lb (87.5 kg)  05/24/18 191 lb 9.6 oz (86.9 kg)  10/31/16 205 lb 12.8 oz (93.4 kg)     GEN: Patient is in no acute distress HEENT: Normal NECK: No JVD; No carotid bruits LYMPHATICS: No lymphadenopathy CARDIAC: Hear sounds regular, 2/6 systolic murmur at the apex. RESPIRATORY:  Clear to auscultation without rales, wheezing or rhonchi  ABDOMEN: Soft, non-tender, non-distended MUSCULOSKELETAL:  No edema; No deformity  SKIN: Warm and dry NEUROLOGIC:  Alert and oriented x 3 PSYCHIATRIC:  Normal affect   Signed, Jenean Lindau, MD  06/01/2018 3:11 PM    [place]

## 2018-06-01 NOTE — Patient Instructions (Signed)
Medication Instructions:  Your physician has recommended you make the following change in your medication:   START: Metoprolol 25 mg daily and nitroglycerin 0.4 mg every 5 min PRN   If you need a refill on your cardiac medications before your next appointment, please call your pharmacy.   Lab work: Your physician recommends that you return for lab work in: BMP,CBC,TSH,LFT and Lipids  If you have labs (blood work) drawn today and your tests are completely normal, you will receive your results only by: Marland Kitchen MyChart Message (if you have MyChart) OR . A paper copy in the mail If you have any lab test that is abnormal or we need to change your treatment, we will call you to review the results.  Testing/Procedures: Your physician has requested that you have a stress echocardiogram. For further information please visit [email or web address]. Please follow instruction sheet as given.   Follow-Up: At [facility], you and your health needs are our priority.  As part of our continuing mission to provide you with exceptional heart care, we have created designated Provider Care Teams.  These Care Teams include your primary Cardiologist (physician) and Advanced Practice Providers (APPs -  Physician Assistants and Nurse Practitioners) who all work together to provide you with the care you need, when you need it. You will need a follow up appointment in 6 months.  Please call our office 2 months in advance to schedule this appointment.  You may see No primary care provider on file. or another member of our [employer] in [place]: Jenne Campus, MD . Shirlee More, MD  Any Other Special Instructions Will Be Listed Below (If Applicable).

## 2018-06-01 NOTE — Telephone Encounter (Signed)
Requested records received from [facility]. Sending back for review.

## 2018-06-02 LAB — BASIC METABOLIC PANEL
BUN / CREAT RATIO: 9 (ref 9–20)
BUN: 8 mg/dL (ref 6–24)
CO2: 25 mmol/L (ref 20–29)
CREATININE: 0.94 mg/dL (ref 0.76–1.27)
Calcium: 9.7 mg/dL (ref 8.7–10.2)
Chloride: 99 mmol/L (ref 96–106)
GFR calc Af Amer: 110 mL/min/{1.73_m2} (ref >59)
GFR, EST NON AFRICAN AMERICAN: 95 mL/min/{1.73_m2} (ref >59)
GLUCOSE: 81 mg/dL (ref 65–99)
Potassium: 3.9 mmol/L (ref 3.5–5.2)
SODIUM: 142 mmol/L (ref 134–144)

## 2018-06-02 LAB — LIPID PANEL
Chol/HDL Ratio: 3.6 ratio (ref 0.0–5.0)
Cholesterol, Total: 156 mg/dL (ref 100–199)
HDL: 43 mg/dL (ref >39)
LDL Calculated: 45 mg/dL (ref 0–99)
Triglycerides: 340 mg/dL — ABNORMAL HIGH (ref 0–149)
VLDL Cholesterol Cal: 68 mg/dL — ABNORMAL HIGH (ref 5–40)

## 2018-06-02 LAB — CBC
Hematocrit: 51.8 % — ABNORMAL HIGH (ref 37.5–51.0)
Hemoglobin: 17.7 g/dL (ref 13.0–17.7)
MCH: 28.4 pg (ref 26.6–33.0)
MCHC: 34.2 g/dL (ref 31.5–35.7)
MCV: 83 fL (ref 79–97)
PLATELETS: 301 10*3/uL (ref 150–450)
RBC: 6.24 x10E6/uL — AB (ref 4.14–5.80)
RDW: 16.5 % — ABNORMAL HIGH (ref 11.6–15.4)
WBC: 9.9 10*3/uL (ref 3.4–10.8)

## 2018-06-02 LAB — HEPATIC FUNCTION PANEL
ALT: 59 IU/L — ABNORMAL HIGH (ref 0–44)
AST: 33 IU/L (ref 0–40)
Albumin: 4.8 g/dL (ref 4.0–5.0)
Alkaline Phosphatase: 77 IU/L (ref 39–117)
Bilirubin Total: 0.9 mg/dL (ref 0.0–1.2)
Bilirubin, Direct: 0.22 mg/dL (ref 0.00–0.40)
Total Protein: 7.4 g/dL (ref 6.0–8.5)

## 2018-06-02 LAB — TSH: TSH: 1.65 u[IU]/mL (ref 0.450–4.500)

## 2018-06-04 ENCOUNTER — Telehealth (HOSPITAL_COMMUNITY): Payer: Self-pay | Admitting: *Deleted

## 2018-06-04 NOTE — Telephone Encounter (Signed)
Patient given detailed instructions per Stress Test Requisition Sheet for test on 06/06/18 at 2:00.Patient Notified to arrive 30 minutes early, and that it is imperative to arrive on time for appointment to keep from having the test rescheduled.  Patient verbalized understanding. Donald Oliver

## 2018-06-05 ENCOUNTER — Encounter: Payer: Self-pay | Admitting: Medical

## 2018-06-05 ENCOUNTER — Other Ambulatory Visit: Payer: Self-pay

## 2018-06-05 DIAGNOSIS — R400 Somnolence: Secondary | ICD-10-CM

## 2018-06-05 MED ORDER — FLUOXETINE HCL 20 MG PO CAPS: 20.0000 mg | ORAL_CAPSULE | Freq: Every day | ORAL | 1 refills | Status: DC

## 2018-06-06 ENCOUNTER — Other Ambulatory Visit (HOSPITAL_COMMUNITY): Payer: Self-pay

## 2018-06-07 ENCOUNTER — Telehealth: Payer: Self-pay

## 2018-06-07 MED ORDER — FISH OIL 1000 MG PO CAPS: 2000.0000 mg | ORAL_CAPSULE | Freq: Two times a day (BID) | ORAL | 3 refills | Status: DC

## 2018-06-07 NOTE — Telephone Encounter (Signed)
Patient states he has read his mychart message. He feels that current blood work is good because he has a hx of elevated triglycerides. Patient agrees to start taking fish oil 2g twice a day. Copy of results sent to Dr. Keturah Barre.Tysinger per Dr.RRR request.

## 2018-06-11 ENCOUNTER — Telehealth (HOSPITAL_COMMUNITY): Payer: Self-pay | Admitting: *Deleted

## 2018-06-11 NOTE — Telephone Encounter (Signed)
Left message on voicemail in reference to upcoming appointment scheduled for 06/13/18 Phone number given for a call back so details instructions can be given. Kirstie Peri

## 2018-06-12 ENCOUNTER — Telehealth (HOSPITAL_COMMUNITY): Payer: Self-pay | Admitting: *Deleted

## 2018-06-12 NOTE — Telephone Encounter (Signed)
Patient given detailed instructions per Stress Test Requisition Sheet for test on 06/13/18 at 1500.Patient Notified to arrive 30 minutes early, and that it is imperative to arrive on time for appointment to keep from having the test rescheduled.  Patient verbalized understanding. Ohm Dentler, Ranae Palms

## 2018-06-13 ENCOUNTER — Ambulatory Visit (HOSPITAL_COMMUNITY): Payer: Self-pay

## 2018-06-13 ENCOUNTER — Ambulatory Visit (HOSPITAL_COMMUNITY): Payer: Self-pay | Attending: Internal Medicine

## 2018-06-13 ENCOUNTER — Other Ambulatory Visit: Payer: Self-pay

## 2018-06-13 DIAGNOSIS — I2510 Atherosclerotic heart disease of native coronary artery without angina pectoris: Secondary | ICD-10-CM

## 2018-06-13 DIAGNOSIS — I10 Essential (primary) hypertension: Secondary | ICD-10-CM

## 2018-06-15 ENCOUNTER — Other Ambulatory Visit: Payer: Self-pay | Admitting: Medical

## 2018-06-15 MED ORDER — TESTOSTERONE 1.62 % TD GEL: 2.0000 | Freq: Every day | TRANSDERMAL | 1 refills | Status: DC

## 2018-06-19 ENCOUNTER — Telehealth: Payer: Self-pay

## 2018-06-19 NOTE — Telephone Encounter (Signed)
-----   Message from Jenean Lindau, MD sent at 06/15/2018 11:57 AM EDT ----- The results of the study is unremarkable. Please inform patient. I will discuss in detail at next appointment. Cc  primary care/referring physician Jenean Lindau, MD 06/15/2018 11:57 AM

## 2018-06-19 NOTE — Telephone Encounter (Signed)
Results relayed to patient and he states that he needs paperwork filled out for [employer]. RN waiting for paperwork to be sent via fax.

## 2018-07-02 IMAGING — MR MR HEAD WO/W CM
16 of 19 series · 34 of 48 positions shown · IV contrast (10ml Multihance)
Comparison: Prior CT from 08/11/2003.

CLINICAL DATA: Initial evaluation for hypogonadism, confusion,
vision loss. Numbness and weakness in extremities.

EXAM:
MRI HEAD WITHOUT AND WITH CONTRAST
TECHNIQUE: Multiplanar, multiecho pulse sequences of the brain and surrounding
structures were obtained without and with intravenous contrast. A
pituitary protocol was utilized.
CONTRAST:  10mL MULTIHANCE GADOBENATE DIMEGLUMINE 529 MG/ML IV SOLN

[Series 2: t1_se_sag · sagittal · 5.0mm · 0.47mm/px · 1 of 19 slices shown]
[im 1/19]
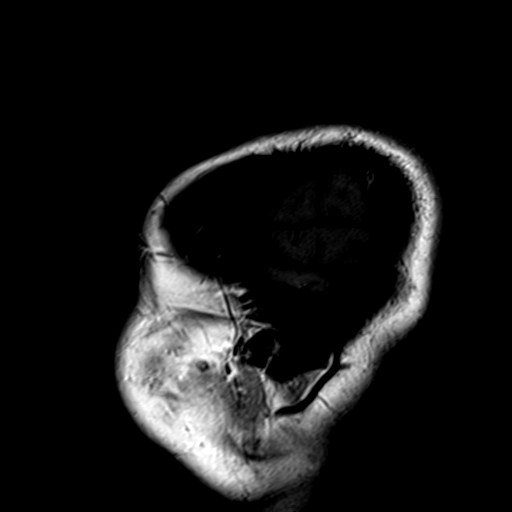

[Series 3: ep2d_diff_(id)_trace · axial · 3.0mm · 1.80mm/px · z∈[+11,+165]mm · 8 of 108 slices shown]
[im 1/108]
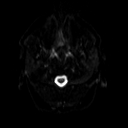
[im 22/108]
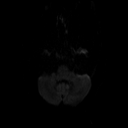
[im 33/108]
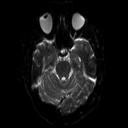
[im 43/108]
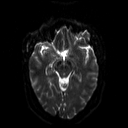
[im 65/108]
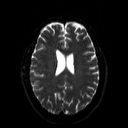
[im 75/108]
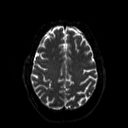
[im 86/108]
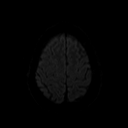
[im 108/108]
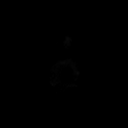

[Series 4: ep2d_diff_(id)_trace_adc · axial · 3.0mm · 1.80mm/px · z∈[+11,+165]mm · 5 of 54 slices shown]
[im 1/54]
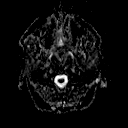
[im 14/54]
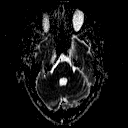
[im 27/54]
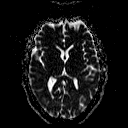
[im 40/54]
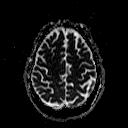
[im 54/54]
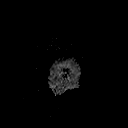

[Series 5: T2 · axial · 5.0mm · 0.45mm/px · z∈[+6,+152]mm · 2 of 24 slices shown]
[im 1/24]
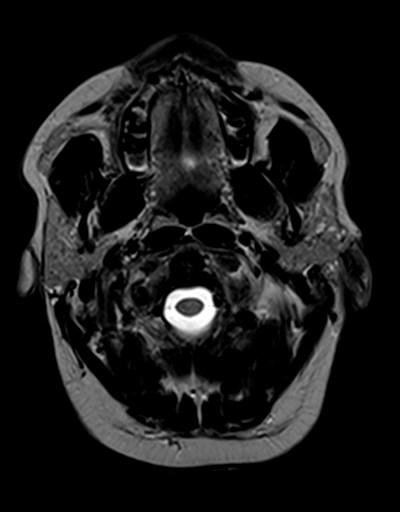
[im 24/24]
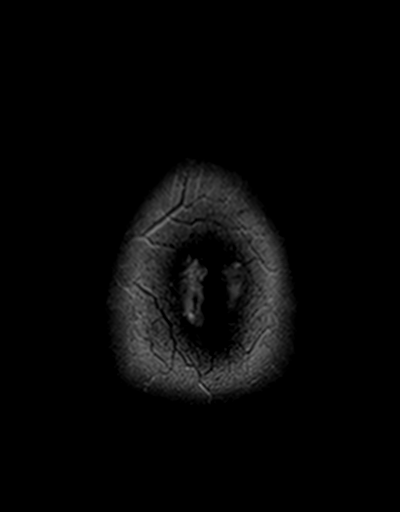

[Series 6: FLAIR · axial · 5.0mm · 0.45mm/px · z∈[+5,+151]mm · 3 of 24 slices shown]
[im 1/24]
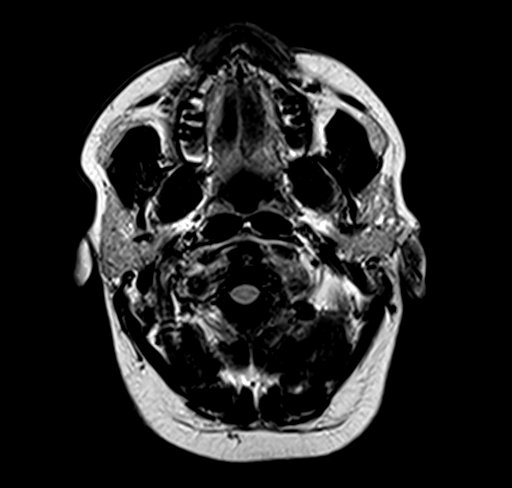
[im 12/24]
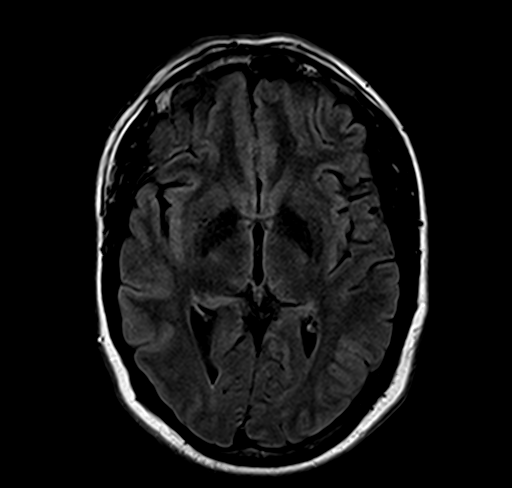
[im 24/24]
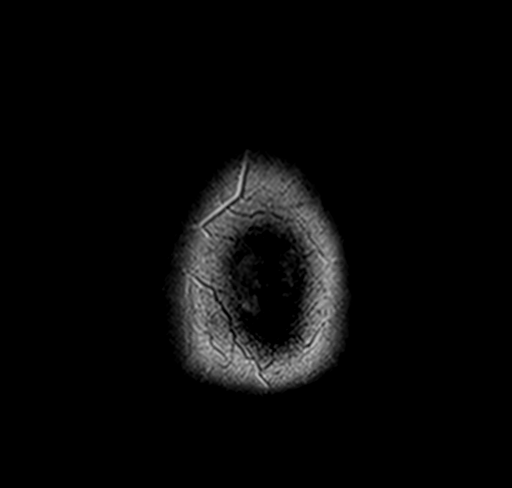

[Series 7: T1 · sagittal · 3.0mm · 0.35mm/px · 1 of 11 slices shown (1 of 2)]
[im 1/11]
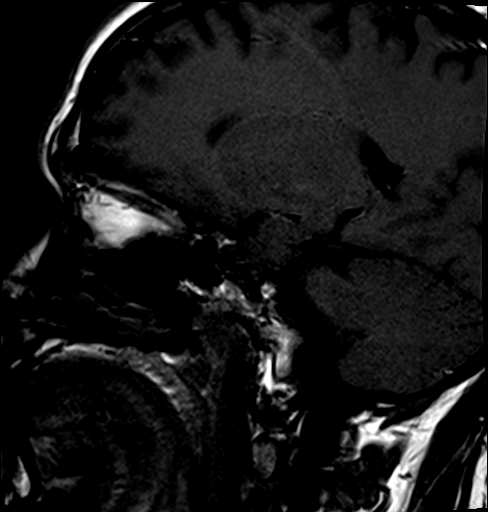

[Series 8: GRE · axial · 5.0mm · 0.45mm/px · z∈[+6,+152]mm · 3 of 24 slices shown]
[im 1/24]
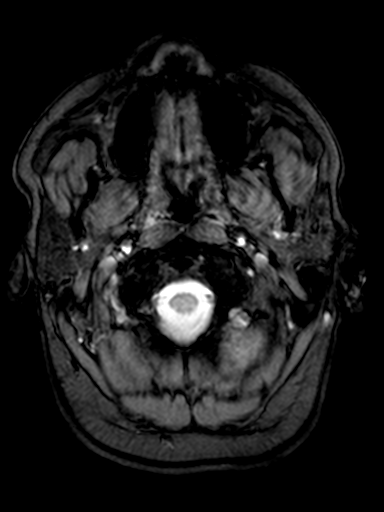
[im 12/24]
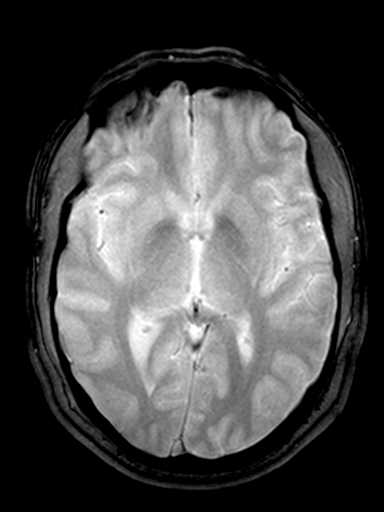
[im 24/24]
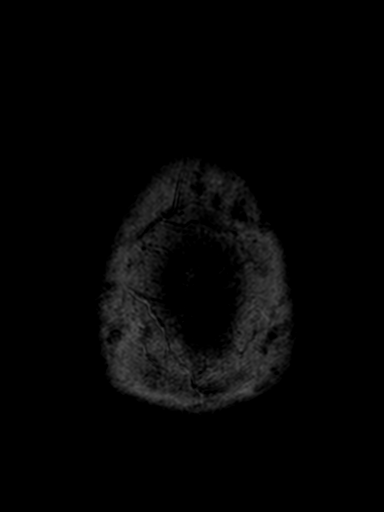

[Series 9: T1 · coronal · 3.0mm · 0.35mm/px · 1 of 10 slices shown (2 of 2)]
[im 1/10]
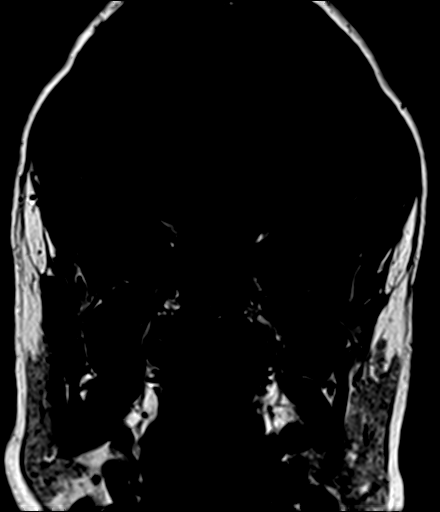

[Series 10: pre cor · coronal · non-contrast · 3.0mm · 0.35mm/px · 1 of 6 slices shown]
[im 1/6]
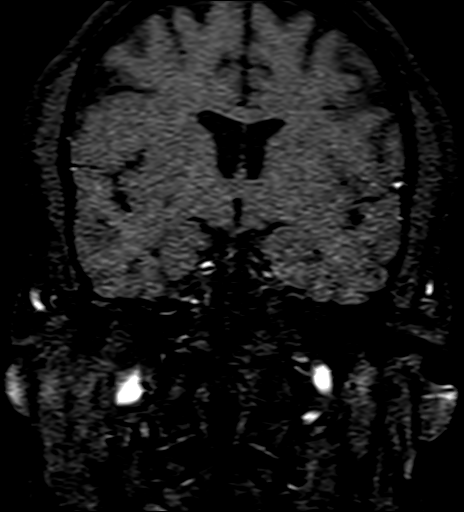

[Series 11: post cor dynamic · coronal · 3.0mm · 0.35mm/px · 1 of 6 slices shown (1 of 4)]
[im 1/6]
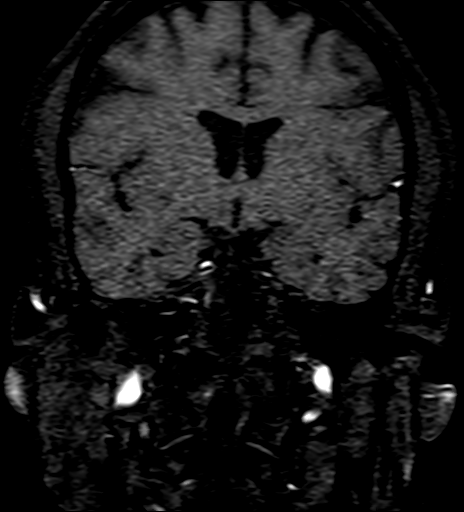

[Series 12: post cor dynamic · coronal · 3.0mm · 0.35mm/px · 1 of 6 slices shown (2 of 4)]
[im 1/6]
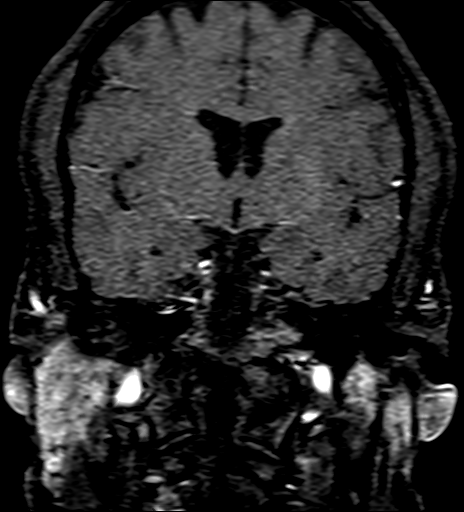

[Series 13: post cor dynamic · coronal · 3.0mm · 0.35mm/px · 1 of 6 slices shown (3 of 4)]
[im 1/6]
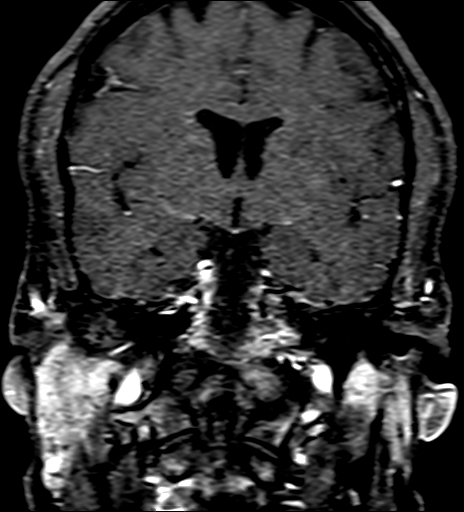

[Series 14: post cor dynamic · coronal · 3.0mm · 0.35mm/px · 1 of 6 slices shown (4 of 4)]
[im 1/6]
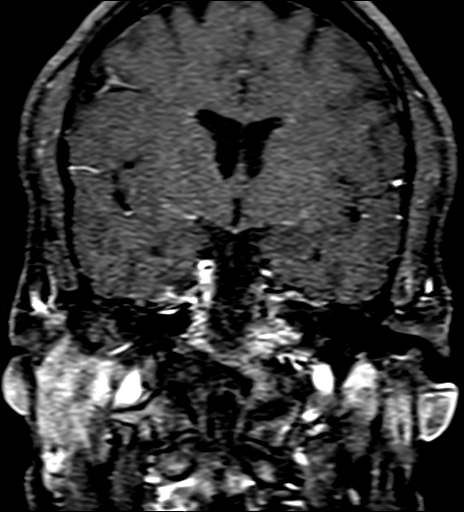

[Series 17: T1 post-contrast · coronal · 3.0mm · 0.35mm/px · 1 of 10 slices shown (1 of 3)]
[im 1/10]
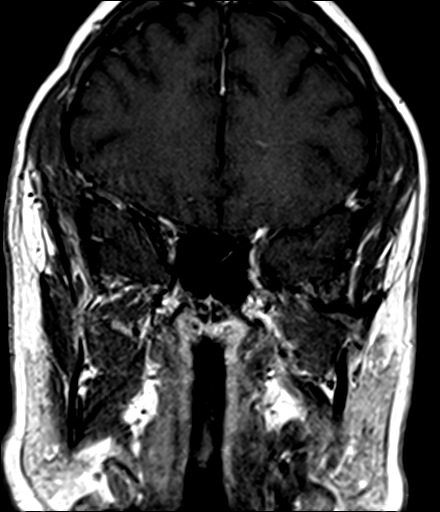

[Series 18: T1 post-contrast · sagittal · 3.0mm · 0.35mm/px · 1 of 11 slices shown (2 of 3)]
[im 1/11]
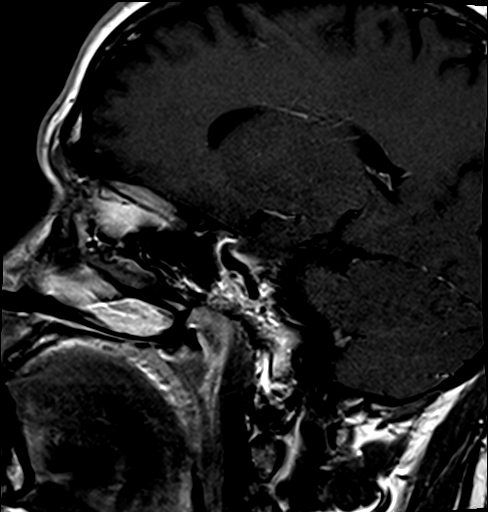

[Series 20: T1 post-contrast · coronal · 5.0mm · 0.69mm/px · 3 of 32 slices shown (3 of 3)]
[im 1/32]
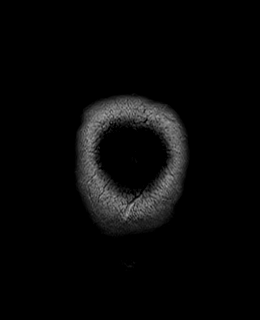
[im 16/32]
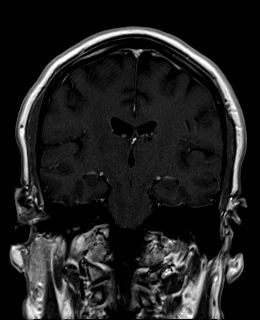
[im 32/32]
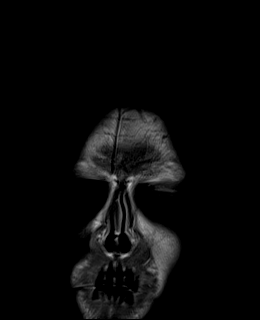

[34 of 48 positions shown; findings below may reference images not displayed]

FINDINGS: Brain: Cerebral volume within normal limits. No focal parenchymal
signal abnormality identified. No significant cerebral white matter
changes for age. No abnormal foci of restricted diffusion to suggest
acute or subacute ischemia. Gray-white matter differentiation well
maintained. No encephalomalacia to suggest chronic infarction. No
foci of susceptibility artifact to suggest acute or chronic
intracranial hemorrhage.

No mass lesion, midline shift or mass effect. Ventricles normal size
without hydrocephalus. Major dural sinuses are grossly patent. No
extra-axial fluid collection. No abnormal enhancement within the
brain itself.

Thin section dynamic imaging was performed through the pituitary
gland and sella. Pituitary gland itself of normal size and
morphology. No focal lesion or areas of relative hypoenhancement to
suggest microadenoma. Pituitary stalk normally positioned at the
midline. No other sellar or suprasellar mass. Optic chiasm normal.
No abnormality seen within the adjacent cavernous sinus.

Vascular: Normal intracranial flow voids are maintained.

Skull and upper cervical spine: Craniocervical junction within
normal limits. Visualized upper cervical spine normal. Bone marrow
signal intensity within normal limits. No scalp soft tissue
abnormality.

Sinuses/Orbits: Globes and orbital soft tissues within normal
limits. Paranasal sinuses are clear. No mastoid effusion. Inner ear
structures normal.

Other: None.
IMPRESSION: Normal pituitary protocol MRI of the brain. No pituitary mass or
other abnormality identified.

## 2018-08-27 ENCOUNTER — Other Ambulatory Visit: Payer: Self-pay | Admitting: Medical

## 2018-08-28 NOTE — Telephone Encounter (Signed)
Is this ok to refill?  

## 2018-08-29 NOTE — Telephone Encounter (Signed)
He is due now for recheck and labs.  Get in for recheck or can make it a physical.

## 2018-08-29 NOTE — Telephone Encounter (Signed)
Is this ok to refill?  

## 2018-08-29 NOTE — Telephone Encounter (Signed)
See my previous message before you replied to me

## 2018-08-30 NOTE — Telephone Encounter (Signed)
Is this ok to refill?  

## 2018-08-31 NOTE — Telephone Encounter (Signed)
Patient states that he has no insurance now and just had all kind of lab work done through his Cardiologist.   He cant afford CPE at this time.   He wanted to know if that would be good enough?  He states he does not need all those labs redone at this time.   Please advise

## 2018-08-31 NOTE — Telephone Encounter (Signed)
I have no recent testosterone labs.   If he has had labs within last 2-3 months, I need them to review, and I need an updated testosterone lab to demonstrated efficacy.    Please let me know about recent labs so I can review them

## 2018-08-31 NOTE — Telephone Encounter (Signed)
Patient made an appointment for Wednesday to have his testosterone checked and a minimal visit with you.   He states that his cardiologist is in [facility] and you should be able to see the labs.

## 2018-09-05 ENCOUNTER — Encounter: Payer: Self-pay | Admitting: Medical

## 2018-09-05 ENCOUNTER — Other Ambulatory Visit: Payer: Self-pay

## 2018-09-05 ENCOUNTER — Ambulatory Visit (INDEPENDENT_AMBULATORY_CARE_PROVIDER_SITE_OTHER): Payer: Self-pay | Admitting: Medical

## 2018-09-05 VITALS — BP 110/80 | HR 74 | Temp 98.4°F | Resp 16 | Ht 69.0 in | Wt 202.4 lb

## 2018-09-05 DIAGNOSIS — E782 Mixed hyperlipidemia: Secondary | ICD-10-CM

## 2018-09-05 DIAGNOSIS — Z9989 Dependence on other enabling machines and devices: Secondary | ICD-10-CM

## 2018-09-05 DIAGNOSIS — R7989 Other specified abnormal findings of blood chemistry: Secondary | ICD-10-CM

## 2018-09-05 DIAGNOSIS — K760 Fatty (change of) liver, not elsewhere classified: Secondary | ICD-10-CM

## 2018-09-05 DIAGNOSIS — L309 Dermatitis, unspecified: Secondary | ICD-10-CM | POA: Insufficient documentation

## 2018-09-05 DIAGNOSIS — N400 Enlarged prostate without lower urinary tract symptoms: Secondary | ICD-10-CM

## 2018-09-05 DIAGNOSIS — I10 Essential (primary) hypertension: Secondary | ICD-10-CM

## 2018-09-05 DIAGNOSIS — N529 Male erectile dysfunction, unspecified: Secondary | ICD-10-CM

## 2018-09-05 DIAGNOSIS — G4733 Obstructive sleep apnea (adult) (pediatric): Secondary | ICD-10-CM

## 2018-09-05 DIAGNOSIS — Z951 Presence of aortocoronary bypass graft: Secondary | ICD-10-CM

## 2018-09-05 DIAGNOSIS — Z125 Encounter for screening for malignant neoplasm of prostate: Secondary | ICD-10-CM

## 2018-09-05 HISTORY — DX: Mixed hyperlipidemia: E78.2

## 2018-09-05 HISTORY — DX: Benign prostatic hyperplasia without lower urinary tract symptoms: N40.0

## 2018-09-05 HISTORY — DX: Dermatitis, unspecified: L30.9

## 2018-09-05 MED ORDER — TADALAFIL 5 MG PO TABS: 5.0000 mg | ORAL_TABLET | Freq: Every day | ORAL | 2 refills | Status: DC | PRN

## 2018-09-05 MED ORDER — TRIAMCINOLONE ACETONIDE 0.1 % EX CREA: 1.0000 "application " | TOPICAL_CREAM | Freq: Two times a day (BID) | CUTANEOUS | 1 refills | Status: DC

## 2018-09-05 NOTE — Progress Notes (Signed)
Subjective:     Patient ID: Donald Oliver, male   DOB: 1970/06/05, 48 y.o.   MRN: [id number]  HPI Chief Complaint  Patient presents with  . follow up    follow up testosterone   Here for medication check and follow-up from February 2020 visit.  He continues on testosterone therapy, AndroGel 1 pump per shoulder and upper arm every day.  No complaints.  Here for lab today for this.  He has no insurance and wants to limit testing reviewed today  He has eczema, has several rough areas that he is using Cetaphil for but not improving.  Areas include knees, thighs, a few other scattered areas  Erectile dysfunction-wants a refill on Cialis 5 mg which he has been using daily until he ran out.  Side effects of the medication.  Has not used nitroglycerin in over a year.  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  No current issues.  When he saw cardiology recently, over-the-counter fish oil was added  He is continuing on Wellbutrin.  Last visit he wanted to try something different but after starting the fluoxetine felt uneasy after 1 week and did not like the way it made him feel so he stopped this.  He is currently taking Crestor, compliant without complaint  Denies using alcohol currently.  He had been exercising until the gym shut down with the coronavirus that home measures  Past Medical History:  Diagnosis Date  . CAD (coronary artery disease), native coronary artery 06/01/2015   Cath 3/3 80% LAD, 99% diag, 50% circ, RCA 100% with Lto R collaterals  CABG 06/02/15 Dr. Cyndia Bent with LIMA to LAD, free RIMA to Diag and SVG to RCA    . Combined hyperlipidemia   . FH: premature coronary heart disease    Father had MI at age 74   . GERD (gastroesophageal reflux disease)   . History of substance abuse ([place])    Pt had abused amphetamines : quit in 2005  . Hypertension 2005  . Impaired fasting blood sugar 2016  . Onychomycosis    Current Outpatient Medications on File Prior to  Visit  Medication Sig Dispense Refill  . aspirin 81 MG tablet Take 81 mg by mouth daily.    Marland Kitchen buPROPion (WELLBUTRIN XL) 300 MG 24 hr tablet Take 450 mg by mouth daily.    Marland Kitchen lisinopril-hydrochlorothiazide (PRINZIDE,ZESTORETIC) 20-25 MG tablet Take 1 tablet by mouth daily.    . metoprolol succinate (TOPROL XL) 25 MG 24 hr tablet Take 1 tablet (25 mg total) by mouth daily. 90 tablet 1  . Omega-3 Fatty Acids (FISH OIL) 600 MG CAPS Take 2 tablets by mouth.    . pantoprazole (PROTONIX) 40 MG tablet TAKE 1 TABLET DAILY 90 tablet 0  . rosuvastatin (CRESTOR) 20 MG tablet Take 20 mg by mouth daily.    . Testosterone 1.62 % GEL Place 2 Pump onto the skin daily. 75 g 1  . nitroGLYCERIN (NITROSTAT) 0.4 MG SL tablet Place 1 tablet (0.4 mg total) under the tongue every 5 (five) minutes as needed for chest pain. 90 tablet 3   No current facility-administered medications on file prior to visit.      Review of Systems ROS as in subjective      Objective:   Physical Exam  BP 110/80   Pulse 74   Temp 98.4 F (36.9 C) (Oral)   Resp 16   Ht 5\' 9"  (1.753 m)   Wt 202  lb 6.4 oz (91.8 kg)   SpO2 95%   BMI 29.89 kg/m   Wt Readings from Last 3 Encounters:  09/05/18 202 lb 6.4 oz (91.8 kg)  06/01/18 193 lb (87.5 kg)  05/24/18 191 lb 9.6 oz (86.9 kg)   BP Readings from Last 3 Encounters:  09/05/18 110/80  06/01/18 (!) 142/76  05/24/18 120/80    General appearance: alert, no distress, WD/WN,  Neck: supple, no lymphadenopathy, no thyromegaly, no masses Skin: bilat anterior inferioe knees with some rough skin, similar pink/red rough patches of skin, 1.5 cm diameter or less on left distal medial thigh anteriorly, right latearl upper thigh distal 1/3, and few patches on forearms simialry Heart: RRR, normal S1, S2, no murmurs Lungs: CTA bilaterally, no wheezes, rhonchi, or rales Abdomen: +bs, soft, non tender, non distended, no masses, no hepatomegaly, no splenomegaly Pulses: 2+ symmetric, upper and  lower extremities, normal cap refill Ext: no edema Psych: pleasant, good eye contact, answers questions appropriately     Assessment:     Encounter Diagnoses  Name Primary?  . Essential hypertension Yes  . OSA on CPAP   . Fatty liver disease, nonalcoholic   . S/P CABG x 3   . Low testosterone in male   . Screening for prostate cancer   . Mixed dyslipidemia   . Eczema, unspecified type   . Erectile dysfunction, unspecified erectile dysfunction type   . Benign prostatic hyperplasia, unspecified whether lower urinary tract symptoms present        Plan:     Hypertension-continue same medication, lisinopril HCT 20/25 mg daily  Hx/o CABG, CAD, Mixed dyslipidemia- continue rosuvastatin 20 mg daily, continue over-the-counter fish oil 1200 g a day currently, counseled on diet and exercise.  Continue aspirin daily  Erectile dysfunction- can restart Cialis daily use for ED and BPH.  Discussed risk benefits proper use of medicine and specifically discussed not using while simultaneously with nitroglycerin due to potentially life-threatening risk.  He states that he has not used nitroglycerin within the last year  OSA-continue CPAP  Discussed fatty liver disease, preventative measures, weight loss, healthy diet and exercise.  Counseled at length on this today  Low testosterone-labs today, prostate cancer screening surveillance, continue current therapy  Atopic dermatitis- can use triamcinolone for flareup, otherwise use daily moisturizing lotion  Kayhan was seen today for follow up.  Diagnoses and all orders for this visit:  Essential hypertension  OSA on CPAP  Fatty liver disease, nonalcoholic  S/P CABG x 3  Low testosterone in male -     Testosterone  Screening for prostate cancer -     PSA  Mixed dyslipidemia  Eczema, unspecified type  Erectile dysfunction, unspecified erectile dysfunction type  Benign prostatic hyperplasia, unspecified whether lower urinary tract  symptoms present  Other orders -     tadalafil (CIALIS) 5 MG tablet; Take 1 tablet (5 mg total) by mouth daily as needed for erectile dysfunction. -     triamcinolone cream (KENALOG) 0.1 %; Apply 1 application topically 2 (two) times daily.  f/u pending labs

## 2018-09-06 LAB — TESTOSTERONE: Testosterone: 294 ng/dL (ref 264–916)

## 2018-09-06 LAB — PSA: Prostate Specific Ag, Serum: 0.9 ng/mL (ref 0.0–4.0)

## 2018-09-07 ENCOUNTER — Telehealth: Payer: Self-pay | Admitting: Medical

## 2018-09-07 ENCOUNTER — Other Ambulatory Visit: Payer: Self-pay | Admitting: Medical

## 2018-09-07 MED ORDER — TESTOSTERONE 1.62 % TD GEL: 2.0000 | Freq: Every day | TRANSDERMAL | 2 refills | Status: DC

## 2018-09-07 NOTE — Telephone Encounter (Signed)
Pt called for refills of Testosterone. Please send to [facility]. Pt can be reached at [phone].

## 2018-10-19 ENCOUNTER — Ambulatory Visit: Payer: BLUE CROSS/BLUE SHIELD | Admitting: Cardiology

## 2018-12-02 ENCOUNTER — Other Ambulatory Visit: Payer: Self-pay | Admitting: Medical

## 2018-12-02 NOTE — Progress Notes (Signed)
Cardiology Office Note:    Date:  12/04/2018   ID:  Donald Oliver, DOB December 08, 1970, MRN [id number]  PCP:  Carlena Hurl, PA-C  Cardiologist:  Shirlee More, MD    Referring MD: Carlena Hurl, PA-C    ASSESSMENT:    1. Coronary artery disease involving native coronary artery of native heart without angina pectoris   2. Combined hyperlipidemia   3. Essential hypertension   4. Former smoker   5. Mixed dyslipidemia    PLAN:    In order of problems listed above:  1. Stable continue medical therapy aspirin beta-blocker antihypertensive statin and add highly purified EPA 2. And highly purified EPA to statin triglycerides 340 on high intensity statin 3. Labile home blood pressures consistently less than 130/80 I encouraged him regarding weight loss and told him we may be able to stop 1 of his antihypertensives if he achieves ideal weight 4. Continues to not smoke   Next appointment: Months   Medication Adjustments/Labs and Tests Ordered: Current medicines are reviewed at length with the patient today.  Concerns regarding medicines are outlined above.  Orders Placed This Encounter  Procedures  . Lipid Profile  . Comprehensive Metabolic Panel (CMET)  . Lipoprotein A (LPA)  . EKG 12-Lead   Meds ordered this encounter  Medications  . Icosapent Ethyl (VASCEPA) 1 g CAPS    Sig: Take 2 capsules (2 g total) by mouth 2 (two) times daily.    Dispense:  120 capsule    Refill:  3  . amLODipine (NORVASC) 10 MG tablet    Sig: Take 1 tablet (10 mg total) by mouth daily.    Dispense:  90 tablet    Refill:  1    No chief complaint on file.   History of Present Illness:    Donald Oliver is a 48 y.o. male with a hx of coronary artery disease with coronary artery bypass surgery 06/01/2015 left thoracic artery was anastomosed to left anterior descending right internal thoracic artery to diagonal and saphenous vein graft to the right coronary artery.  Other problems include  hyperlipidemia glucose intolerance and hypertension.  He was last seen 06/15/2018. Compliance with diet, lifestyle and medications: Yes  Recovered from bypass surgery but unfortunate his life is terribly disruptive his marriage broke up he had depression still takes an antidepressant is concerned with weight gain.  No edema shortness of breath chest pain palpitation or syncope.  Despite high intensity statin his triglycerides were elevated 340 and I will place him on vascepa along with a statin check his labs in 1 month.  His EKG today sinus rhythm right bundle branch block.  We will also check a lipoprotein a with his next lipid profile Stress echo performed 06/13/2018 showed an exercise tolerance of 13 METS.  There is no anginal discomfort the EKG did not show ischemic changes.  The blood pressure at baseline was hypertensive 146/95 and the exercise response was hypertensive 226/58.  The ejection fraction was normal 55 to 60%.  There is no evidence of EKG or echo ischemia during the test. Past Medical History:  Diagnosis Date  . CAD (coronary artery disease), native coronary artery 06/01/2015   Cath 3/3 80% LAD, 99% diag, 50% circ, RCA 100% with Lto R collaterals  CABG 06/02/15 Dr. Cyndia Bent with LIMA to LAD, free RIMA to Diag and SVG to RCA    . Combined hyperlipidemia   . FH: premature coronary heart disease    Father had MI  at age 61   . GERD (gastroesophageal reflux disease)   . History of substance abuse ([place])    Pt had abused amphetamines : quit in 2005  . Hypertension 2005  . Impaired fasting blood sugar 2016  . Onychomycosis     Past Surgical History:  Procedure Laterality Date  . CARDIAC CATHETERIZATION N/A 05/29/2015   Procedure: Left Heart Cath and Coronary Angiography;  Surgeon: Jettie Booze, MD;  Location: [facility];  Service: Cardiovascular;  Laterality: N/A;  . CARDIAC CATHETERIZATION  05/29/2015   Procedure: Coronary Balloon Angioplasty;  Surgeon: Jettie Booze,  MD;  Location: [facility];  Service: Cardiovascular;;  . CORONARY ARTERY BYPASS GRAFT N/A 06/02/2015   Procedure: CORONARY ARTERY BYPASS GRAFTING (CABG) x  3 utilizing bilateral internal mammary artery and endoscopically harvested right sapheneous vein.;  Surgeon: [name], MD;  Location: [facility];  Service: Open Heart Surgery;  Laterality: N/A;  . RHINOPLASTY  20065   w/repair of nasal fractuare; done at [facility]   . TEE WITHOUT CARDIOVERSION N/A 06/02/2015   Procedure: TRANSESOPHAGEAL ECHOCARDIOGRAM (TEE);  Surgeon: [name], MD;  Location: [facility];  Service: Open Heart Surgery;  Laterality: N/A;    Current Medications: Current Meds  Medication Sig  . amLODipine (NORVASC) 10 MG tablet Take 1 tablet (10 mg total) by mouth daily.  Marland Kitchen aspirin 81 MG tablet Take 81 mg by mouth daily.  Marland Kitchen buPROPion (WELLBUTRIN XL) 300 MG 24 hr tablet Take 300 mg by mouth daily.   Marland Kitchen lisinopril-hydrochlorothiazide (PRINZIDE,ZESTORETIC) 20-25 MG tablet Take 1 tablet by mouth daily.  . metoprolol succinate (TOPROL XL) 25 MG 24 hr tablet Take 1 tablet (25 mg total) by mouth daily.  . nitroGLYCERIN (NITROSTAT) 0.4 MG SL tablet Place 1 tablet (0.4 mg total) under the tongue every 5 (five) minutes as needed for chest pain.  . pantoprazole (PROTONIX) 40 MG tablet TAKE 1 TABLET DAILY  . rosuvastatin (CRESTOR) 20 MG tablet Take 20 mg by mouth daily.  . tadalafil (CIALIS) 5 MG tablet Take 1 tablet (5 mg total) by mouth daily as needed for erectile dysfunction.  . Testosterone 1.62 % GEL Place 2 Pump onto the skin daily.  Marland Kitchen triamcinolone cream (KENALOG) 0.1 % Apply 1 application topically 2 (two) times daily.  . [DISCONTINUED] amLODipine (NORVASC) 10 MG tablet Take 10 mg by mouth daily.  . [DISCONTINUED] Omega-3 Fatty Acids (FISH OIL) 600 MG CAPS Take 2 tablets by mouth.     Allergies:   Amoxicillin and Fluoxetine   Social History   Socioeconomic History  . Marital status: Married    Spouse name: Not on  file  . Number of children: 3  . Years of education: 73   . Highest education level: Not on file  Occupational History  . Occupation: Sports coach    Comment: unemployed   Social Needs  . Financial resource strain: Not on file  . Food insecurity    Worry: Not on file    Inability: Not on file  . Transportation needs    Medical: Not on file    Non-medical: Not on file  Tobacco Use  . Smoking status: Former Smoker    Packs/day: 1.00    Years: 30.00    Pack years: 30.00    Types: Cigarettes  . Smokeless tobacco: Never Used  Substance and Sexual Activity  . Alcohol use: Yes    Alcohol/week: 1.0 standard drinks    Types: 1 Standard  drinks or equivalent per week    Comment: per week occ  . Drug use: Yes    Frequency: 1.0 times per week    Types: Marijuana    Comment: 05/29/2015 "none in the last 10 years"  . Sexual activity: Yes  Lifestyle  . Physical activity    Days per week: Not on file    Minutes per session: Not on file  . Stress: Not on file  Relationships  . Social Herbalist on phone: Not on file    Gets together: Not on file    Attends religious service: Not on file    Active member of club or organization: Not on file    Attends meetings of clubs or organizations: Not on file    Relationship status: Not on file  Other Topics Concern  . Not on file  Social History Narrative   Married has 3 girls ages 12yo, 49yo, 49yo, works at [employer], walks several miles daily at work, hx/o substance abuse in the past     Family History: The patient's family history includes Diabetes in his father; HIV in his sister; Heart disease (age of onset: 79) in his father; Heart failure in his father. ROS:   Please see the history of present illness.    All other systems reviewed and are negative.  EKGs/Labs/Other Studies Reviewed:    The following studies were reviewed today:  EKG:  EKG ordered today and personally reviewed.  The ekg ordered today  demonstrates sinus rhythm right axis deviation otherwise normal  Recent Labs: 06/01/2018: ALT 59; BUN 8; Creatinine, Ser 0.94; Hemoglobin 17.7; Platelets 301; Potassium 3.9; Sodium 142; TSH 1.650  Recent Lipid Panel    Component Value Date/Time   CHOL 156 06/01/2018 1547   TRIG 340 (H) 06/01/2018 1547   HDL 43 06/01/2018 1547   CHOLHDL 3.6 06/01/2018 1547   CHOLHDL 5.6 (H) 10/31/2016 1500   VLDL 74 (H) 10/31/2016 1500   LDLCALC 45 06/01/2018 1547    Physical Exam:    VS:  BP 140/88 (BP Location: Right Arm, Patient Position: Sitting, Cuff Size: Normal)   Pulse 80   Temp 98.1 F (36.7 C)   Ht 5\' 9"  (1.753 m)   Wt 206 lb 1.9 oz (93.5 kg)   SpO2 98%   BMI 30.44 kg/m     Wt Readings from Last 3 Encounters:  12/04/18 206 lb 1.9 oz (93.5 kg)  09/05/18 202 lb 6.4 oz (91.8 kg)  06/01/18 193 lb (87.5 kg)     GEN:  Well nourished, well developed in no acute distress HEENT: Normal NECK: No JVD; No carotid bruits LYMPHATICS: No lymphadenopathy CARDIAC: No keloid is present RRR, no murmurs, rubs, gallops RESPIRATORY:  Clear to auscultation without rales, wheezing or rhonchi  ABDOMEN: Soft, non-tender, non-distended MUSCULOSKELETAL:  No edema; No deformity  SKIN: Warm and dry NEUROLOGIC:  Alert and oriented x 3 PSYCHIATRIC:  Normal affect    Signed, Shirlee More, MD  12/04/2018 4:54 PM    [facility]

## 2018-12-04 ENCOUNTER — Ambulatory Visit (INDEPENDENT_AMBULATORY_CARE_PROVIDER_SITE_OTHER): Payer: Self-pay | Admitting: Cardiology

## 2018-12-04 ENCOUNTER — Encounter: Payer: Self-pay | Admitting: Cardiology

## 2018-12-04 ENCOUNTER — Other Ambulatory Visit: Payer: Self-pay

## 2018-12-04 VITALS — BP 140/88 | HR 80 | Temp 98.1°F | Ht 69.0 in | Wt 206.1 lb

## 2018-12-04 DIAGNOSIS — E782 Mixed hyperlipidemia: Secondary | ICD-10-CM

## 2018-12-04 DIAGNOSIS — I10 Essential (primary) hypertension: Secondary | ICD-10-CM

## 2018-12-04 DIAGNOSIS — Z87891 Personal history of nicotine dependence: Secondary | ICD-10-CM

## 2018-12-04 DIAGNOSIS — I2510 Atherosclerotic heart disease of native coronary artery without angina pectoris: Secondary | ICD-10-CM

## 2018-12-04 MED ORDER — AMLODIPINE BESYLATE 10 MG PO TABS: 10.0000 mg | ORAL_TABLET | Freq: Every day | ORAL | 1 refills | Status: DC

## 2018-12-04 MED ORDER — VASCEPA 1 G PO CAPS: 2.0000 | ORAL_CAPSULE | Freq: Two times a day (BID) | ORAL | 3 refills | Status: DC

## 2018-12-04 NOTE — Patient Instructions (Addendum)
Medication Instructions:  Your physician has recommended you make the following change in your medication:   STOP over the counter fish oil  START vascepa 1 gram: Take 2 capsules (2 grams) twice daily: samples provided  If you need a refill on your cardiac medications before your next appointment, please call your pharmacy.   Lab work: Your physician recommends that you return for lab work in 6 weeks: CMP, lipid panel, lipoprotein A (LPa). Please return to our office for lab work, no appointment needed. Please fast beforehand.   If you have labs (blood work) drawn today and your tests are completely normal, you will receive your results only by: Marland Kitchen MyChart Message (if you have MyChart) OR . A paper copy in the mail If you have any lab test that is abnormal or we need to change your treatment, we will call you to review the results.  Testing/Procedures: You had an EKG today.   Follow-Up: At [facility], you and your health needs are our priority.  As part of our continuing mission to provide you with exceptional heart care, we have created designated Provider Care Teams.  These Care Teams include your primary Cardiologist (physician) and Advanced Practice Providers (APPs -  Physician Assistants and Nurse Practitioners) who all work together to provide you with the care you need, when you need it. You will need a follow up appointment in 6 months.  Please call our office 2 months in advance to schedule this appointment.      Icosapent ethyl capsules What is this medicine? ICOSAPENT ETHYL (eye KOE sa pent eth il) contains essential fats. It is used to treat high triglyceride levels. Diet and lifestyle changes are often used with this drug. This medicine may be used for other purposes; ask your health care provider or pharmacist if you have questions. COMMON BRAND NAME(S): VASCEPA What should I tell my health care provider before I take this medicine? They need to know if you have any  of these conditions:  bleeding disorders  liver disease  an unusual or allergic reaction to icosapent ethyl, fish, shellfish, other medicines, foods, dyes, or preservatives  history of irregular heartbeat  pregnant or trying to get pregnant  breast-feeding How should I use this medicine? Take this medicine by mouth with a glass of water. Follow the directions on the prescription label. Take this medicine with food. Do not cut, crush, dissolve, or chew this medicine. Take your medicine at regular intervals. Do not take it more often than directed. Do not stop taking except on your doctor's advice. Talk to your pediatrician regarding the use of this medicine in children. Special care may be needed. Overdosage: If you think you have taken too much of this medicine contact a poison control center or emergency room at once. NOTE: This medicine is only for you. Do not share this medicine with others. What if I miss a dose? If you miss a dose, take it as soon as you can. If it is almost time for your next dose, take only that dose. Do not take double or extra doses. What may interact with this medicine? This medicine may interact with the following medications:  aspirin and aspirin-like medicines  beta-blockers like metoprolol and propranolol  certain medicines that treat or prevent blood clots like warfarin, enoxaparin, dalteparin, apixaban, dabigatran, and rivaroxaban  diuretics  male hormones, like estrogens and birth control pills This list may not describe all possible interactions. Give your health care provider a list  of all the medicines, herbs, non-prescription drugs, or dietary supplements you use. Also tell them if you smoke, drink alcohol, or use illegal drugs. Some items may interact with your medicine. What should I watch for while using this medicine? You may need blood work done while you are taking this medicine. Follow a good diet and exercise plan. Taking this  medicine does not replace a healthy lifestyle. Some foods that have omega-3 fatty acids naturally are fatty fish like albacore tuna, halibut, herring, mackerel, lake trout, salmon, and sardines. If you are scheduled for any medical or dental procedure, tell your healthcare provider that you are taking this medicine. You may need to stop taking this medicine before the procedure. What side effects may I notice from receiving this medicine? Side effects that you should report to your doctor or health care professional as soon as possible:  allergic reactions like skin rash, itching or hives, swelling of the face, lips, or tongue  breathing problems  unusual bleeding or bruising  fast, irregular heartbeat Side effects that usually do not require medical attention (report to your doctor or health care professional if they continue or are bothersome):  muscle or joint pain  sore throat  swelling of the ankles, feet, hands  constipation  Gout This list may not describe all possible side effects. Call your doctor for medical advice about side effects. You may report side effects to FDA at 1-800-FDA-1088. Where should I keep my medicine? Keep out of the reach of children. Store at room temperature between 15 and 30 degrees C (59 and 86 degrees F). Throw away any unused medicine after the expiration date. NOTE: This sheet is a summary. It may not cover all possible information. If you have questions about this medicine, talk to your doctor, pharmacist, or health care provider.  2020 Elsevier/Gold Standard (2018-03-13 18:35:46)

## 2018-12-05 ENCOUNTER — Other Ambulatory Visit: Payer: Self-pay | Admitting: Cardiology

## 2018-12-05 ENCOUNTER — Other Ambulatory Visit: Payer: Self-pay | Admitting: Medical

## 2018-12-07 ENCOUNTER — Other Ambulatory Visit: Payer: Self-pay | Admitting: Medical

## 2018-12-07 NOTE — Telephone Encounter (Signed)
Pt called to asked for refills as well

## 2018-12-11 ENCOUNTER — Telehealth: Payer: Self-pay | Admitting: Medical

## 2018-12-11 NOTE — Telephone Encounter (Signed)
Patient had visit in June. He does not have insurance and said if it does not feel necessary he would like to decline. Please advise.

## 2018-12-11 NOTE — Telephone Encounter (Signed)
However, patient is now requesting a refill on Wellbutrin.

## 2018-12-11 NOTE — Telephone Encounter (Signed)
Pt called for refills of Testosterone and Wellbutrin. Please send to [facility] on [address]. Pt can be reached at [phone].

## 2018-12-11 NOTE — Telephone Encounter (Signed)
Due for f/u, med check.  Please schedule

## 2018-12-12 ENCOUNTER — Other Ambulatory Visit: Payer: Self-pay | Admitting: Medical

## 2018-12-12 MED ORDER — BUPROPION HCL ER (XL) 300 MG PO TB24: 300.0000 mg | ORAL_TABLET | Freq: Every day | ORAL | 0 refills | Status: DC

## 2018-12-12 NOTE — Telephone Encounter (Signed)
Called and left message for pt

## 2018-12-12 NOTE — Telephone Encounter (Signed)
I sent testosterone yesterday, just refilled Wellbutrin now.    Get him in for med check visit soon.

## 2019-02-07 ENCOUNTER — Telehealth: Payer: Self-pay | Admitting: Cardiology

## 2019-02-07 NOTE — Telephone Encounter (Signed)
°*  STAT* If patient is at the pharmacy, call can be transferred to refill team.   1. Which medications need to be refilled? (please list name of each medication and dose if known) Metoprolol 25mg , Tadalafil 5mg , Amlodipine 10mg , Rosuvastatin 20mg , and Lisinopril 20-25mg   2. Which pharmacy/location (including street and city if local pharmacy) is medication to be sent to?[facility] on [address]  3. Do they need a 30 day or 90 day supply? [place]

## 2019-02-11 MED ORDER — LISINOPRIL-HYDROCHLOROTHIAZIDE 20-25 MG PO TABS: 1.0000 | ORAL_TABLET | Freq: Every day | ORAL | 1 refills | Status: DC

## 2019-02-11 MED ORDER — ROSUVASTATIN CALCIUM 20 MG PO TABS: 20.0000 mg | ORAL_TABLET | Freq: Every day | ORAL | 1 refills | Status: DC

## 2019-02-11 MED ORDER — AMLODIPINE BESYLATE 10 MG PO TABS: 10.0000 mg | ORAL_TABLET | Freq: Every day | ORAL | 1 refills | Status: DC

## 2019-02-11 MED ORDER — METOPROLOL SUCCINATE ER 25 MG PO TB24: 25.0000 mg | ORAL_TABLET | Freq: Every day | ORAL | 1 refills | Status: DC

## 2019-02-11 NOTE — Telephone Encounter (Signed)
Refills sent as requested to [facility] in [place] except for Cialis. Patient called and informed that further refills of Cialis will have to come from his PCP as that is the office who prescribed it. Patient verbalized understanding. No further questions.

## 2019-02-13 ENCOUNTER — Other Ambulatory Visit: Payer: Self-pay

## 2019-02-13 ENCOUNTER — Ambulatory Visit (INDEPENDENT_AMBULATORY_CARE_PROVIDER_SITE_OTHER): Payer: Self-pay | Admitting: Medical

## 2019-02-13 ENCOUNTER — Encounter: Payer: Self-pay | Admitting: Medical

## 2019-02-13 VITALS — BP 132/80 | HR 72 | Temp 97.7°F | Ht 69.0 in | Wt 203.4 lb

## 2019-02-13 DIAGNOSIS — R7989 Other specified abnormal findings of blood chemistry: Secondary | ICD-10-CM

## 2019-02-13 DIAGNOSIS — N529 Male erectile dysfunction, unspecified: Secondary | ICD-10-CM

## 2019-02-13 DIAGNOSIS — I10 Essential (primary) hypertension: Secondary | ICD-10-CM

## 2019-02-13 DIAGNOSIS — K219 Gastro-esophageal reflux disease without esophagitis: Secondary | ICD-10-CM

## 2019-02-13 DIAGNOSIS — Z87891 Personal history of nicotine dependence: Secondary | ICD-10-CM

## 2019-02-13 DIAGNOSIS — R718 Other abnormality of red blood cells: Secondary | ICD-10-CM

## 2019-02-13 DIAGNOSIS — E782 Mixed hyperlipidemia: Secondary | ICD-10-CM

## 2019-02-13 DIAGNOSIS — Z951 Presence of aortocoronary bypass graft: Secondary | ICD-10-CM

## 2019-02-13 DIAGNOSIS — K760 Fatty (change of) liver, not elsewhere classified: Secondary | ICD-10-CM

## 2019-02-13 DIAGNOSIS — I2510 Atherosclerotic heart disease of native coronary artery without angina pectoris: Secondary | ICD-10-CM

## 2019-02-13 DIAGNOSIS — R7301 Impaired fasting glucose: Secondary | ICD-10-CM

## 2019-02-13 MED ORDER — TESTOSTERONE 20.25 MG/ACT (1.62%) TD GEL: TRANSDERMAL | 2 refills | Status: DC

## 2019-02-13 MED ORDER — TADALAFIL 5 MG PO TABS: ORAL_TABLET | ORAL | 2 refills | Status: DC

## 2019-02-13 MED ORDER — PANTOPRAZOLE SODIUM 40 MG PO TBEC: 40.0000 mg | DELAYED_RELEASE_TABLET | Freq: Every day | ORAL | 3 refills | Status: DC

## 2019-02-13 NOTE — Patient Instructions (Signed)
Exercise: I recommend exercising most days of the week using a type of exercise that you would enjoy and stick to such as walking, running, swimming, hiking, biking, aerobics, etc. This needs to be at least 30-40 minutes at a time, at least 5 days/week with moderate intensity.    Low Carb Diet recommendations:  I recommend you drink water throughout the day.   70 ounces or 2 liters would be a good amount.  If you have been accustomed to drinking juice or soda, try water with lemon or water with lime, or try using no calorie flavor dropper.  I recommend the following as an example meal plan that includes 3 meals per day.   You can skip some meals periodically for intermittent fasting.  Breakfast (choose one): . Omelette with egg, can include a little bit of cheese, your choice of mushrooms, peppers, onions, salsa . Smoothie with handful of spinach or kale, 1 cup of milk or water, 1 cup of berries, 1 packet of artificial sweetener such as stevia . Yogurt with fruit   Mid morning snack: . 1 fruit serving and 1 protein such as 8 almonds or 8 nuts, or vegetable such as carrots and humus or other similar vegetable   Lunch: . Salad with 3-4 ounces of lean grilled or baked meat such as fish, chicken or turkey   Mid afternoon snack: . 1 fruit serving and 1 protein such as 8 almonds or 8 nuts, or vegetable such as carrots and humus or other similar vegetable   Dinner: . Large serving of vegetables and 3-4 ounces of lean grilled or baked meat such as fish, chicken or turkey . Or vegetarian dish without meat    Avoid  . Chips, cookies, cake, donuts, soda, sweet tea, juices, candy, fast food . For now , avoid, or significantly limit grains  

## 2019-02-13 NOTE — Progress Notes (Signed)
Subjective: Chief Complaint  Patient presents with  . Medication Management   Here for med check.  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  When he first got diagnosed he felt extremely fatigued, low levels in the 70s with testosterone.  So he feels like his current therapy is working okay.  Uses AndroGel 2 pumps daily.  He would rather not have lab work today as he does not have insurance currently.  He is supposed to have insurance in January at the first of the year.  He saw cardiology recently, they advised he add prescription fish oil, but it was very expensive.  So he is not taking it currently as he does not have insurance.  He notes his diet could be better.  He notes that he does eat a fair amount of junk food.  History of fatty liver disease and elevated liver test-he is exercising at [employer], not drinking any alcohol.  No concern for hepatitis exposure.  Erectile dysfunction-doing fine on Cialis.  He is not using nitroglycerin, has not used this ever  GERD-doing fine on current therapy Protonix but needs a refill  Past Medical History:  Diagnosis Date  . CAD (coronary artery disease), native coronary artery 06/01/2015   Cath 3/3 80% LAD, 99% diag, 50% circ, RCA 100% with Lto R collaterals  CABG 06/02/15 Dr. Cyndia Bent with LIMA to LAD, free RIMA to Diag and SVG to RCA    . Combined hyperlipidemia   . FH: premature coronary heart disease    Father had MI at age 23   . GERD (gastroesophageal reflux disease)   . History of substance abuse ([place])    Pt had abused amphetamines : quit in 2005  . Hypertension 2005  . Impaired fasting blood sugar 2016  . Onychomycosis    Current Outpatient Medications on File Prior to Visit  Medication Sig Dispense Refill  . amLODipine (NORVASC) 10 MG tablet Take 1 tablet (10 mg total) by mouth daily. 90 tablet 1  . aspirin 81 MG tablet Take 81 mg by mouth daily.    Marland Kitchen buPROPion (WELLBUTRIN XL) 300 MG 24 hr tablet Take 1 tablet (300  mg total) by mouth daily. 90 tablet 0  . Icosapent Ethyl (VASCEPA) 1 g CAPS Take 2 capsules (2 g total) by mouth 2 (two) times daily. 120 capsule 3  . lisinopril-hydrochlorothiazide (ZESTORETIC) 20-25 MG tablet Take 1 tablet by mouth daily. 90 tablet 1  . metoprolol succinate (TOPROL-XL) 25 MG 24 hr tablet Take 1 tablet (25 mg total) by mouth daily. 90 tablet 1  . rosuvastatin (CRESTOR) 20 MG tablet Take 1 tablet (20 mg total) by mouth daily. 90 tablet 1  . triamcinolone cream (KENALOG) 0.1 % Apply 1 application topically 2 (two) times daily. 30 g 1  . nitroGLYCERIN (NITROSTAT) 0.4 MG SL tablet Place 1 tablet (0.4 mg total) under the tongue every 5 (five) minutes as needed for chest pain. (Patient not taking: Reported on 49/18/2020) 90 tablet 3   No current facility-administered medications on file prior to visit.    ROS as in subjective    Objective: BP 132/80   Pulse 72   Temp 97.7 F (36.5 C)   Ht 5\' 9"  (1.753 m)   Wt 203 lb 6.4 oz (92.3 kg)   SpO2 97%   BMI 30.04 kg/m   Gen: wd, wn, nad Lungs clear Heart rrr, normal s1, s2, no murmurs Ext: no edema Pulses 2+ Abdomen: Soft, nontender no organomegaly  Assessment: Encounter Diagnoses  Name Primary?  . Essential hypertension Yes  . Fatty liver disease, nonalcoholic   . Impaired fasting blood sugar   . Combined hyperlipidemia   . Erectile dysfunction, unspecified erectile dysfunction type   . Gastroesophageal reflux disease, unspecified whether esophagitis present   . Elevated LFTs   . Mixed dyslipidemia   . Low testosterone in male   . Former smoker   . Erythrocyte abnormality   . Coronary artery disease involving native coronary artery of native heart without angina pectoris   . S/P CABG x 3     Plan: Hypertension, history of CABG, CAD-I reviewed his recent cardiology notes from September.  He continues on current medications.  He did not start the prescription purified fish oil due to cost and he has no  insurance currently.  He will revisit this in January when his insurance goes into effect  Mixed dyslipidemia-continue plan to start prescription fish oil in January, continue statin and aspirin..  Counseled on the need to do better with diet, continue regular exercise.  GERD-doing fine on Protonix, medication refilled  Low testosterone, hypogonadism-been on therapy approximately 2 years.  Due for labs but he declines today given no insurance and wants to hold off till January.  Continue AndroGel 2 pumps daily.  Prostate blood test reviewed from summertime 2020 which was normal.  Fatty liver disease, history of elevated LFTs-negative hepatitis infection screening labs August 2018, failure disease on ultrasound August 2018, CK normal 2018.  Advise he be stricter with diet and weight loss efforts to help manage his fatty liver disease.  Counseled on low-fat diet  His recent red cell blood test in June were elevated-likely due to testosterone therapy.  Monitor this with repeat labs after the first of the year 2021   ED-doing fine on Cialis.  He was advised not to ever use this in conjunction with nitroglycerin, certainly not simultaneously within a 24 hour period given potential risk including death    Donald Oliver was seen today for medication management.  Diagnoses and all orders for this visit:  Essential hypertension  Fatty liver disease, nonalcoholic  Impaired fasting blood sugar  Combined hyperlipidemia  Erectile dysfunction, unspecified erectile dysfunction type  Gastroesophageal reflux disease, unspecified whether esophagitis present  Elevated LFTs  Mixed dyslipidemia  Low testosterone in male  Former smoker  Erythrocyte abnormality  Coronary artery disease involving native coronary artery of native heart without angina pectoris  S/P CABG x 3  Other orders -     tadalafil (CIALIS) 5 MG tablet; TAKE ONE TABLET BY MOUTH DAILY AS NEEDED FOR ERECTILE DYSFUNCTION -      pantoprazole (PROTONIX) 40 MG tablet; Take 1 tablet (40 mg total) by mouth daily. -     Testosterone 20.25 MG/ACT (1.62%) GEL; PLACE 2 PUMPS ONTO THE SKIN DAILY

## 2019-03-06 ENCOUNTER — Other Ambulatory Visit: Payer: Self-pay | Admitting: Medical

## 2019-03-08 ENCOUNTER — Other Ambulatory Visit: Payer: Self-pay | Admitting: Medical

## 2019-03-18 ENCOUNTER — Other Ambulatory Visit: Payer: Medicaid Other

## 2019-03-20 ENCOUNTER — Other Ambulatory Visit: Payer: Medicaid Other

## 2019-03-21 ENCOUNTER — Ambulatory Visit: Payer: Medicaid Other | Attending: Internal Medicine

## 2019-03-21 DIAGNOSIS — Z20822 Contact with and (suspected) exposure to covid-19: Secondary | ICD-10-CM

## 2019-03-22 LAB — NOVEL CORONAVIRUS, NAA: SARS-CoV-2, NAA: NOT DETECTED

## 2019-03-25 ENCOUNTER — Ambulatory Visit: Payer: Medicaid Other | Attending: Internal Medicine

## 2019-03-25 DIAGNOSIS — Z20822 Contact with and (suspected) exposure to covid-19: Secondary | ICD-10-CM

## 2019-03-27 ENCOUNTER — Ambulatory Visit: Payer: Medicaid Other | Attending: Internal Medicine

## 2019-03-27 DIAGNOSIS — R238 Other skin changes: Secondary | ICD-10-CM

## 2019-03-27 DIAGNOSIS — U071 COVID-19: Secondary | ICD-10-CM

## 2019-03-27 LAB — NOVEL CORONAVIRUS, NAA: SARS-CoV-2, NAA: NOT DETECTED

## 2019-03-28 LAB — NOVEL CORONAVIRUS, NAA: SARS-CoV-2, NAA: NOT DETECTED

## 2019-04-02 ENCOUNTER — Ambulatory Visit: Payer: Medicaid Other | Attending: Internal Medicine

## 2019-04-02 DIAGNOSIS — Z20822 Contact with and (suspected) exposure to covid-19: Secondary | ICD-10-CM

## 2019-04-04 LAB — NOVEL CORONAVIRUS, NAA: SARS-CoV-2, NAA: NOT DETECTED

## 2019-04-09 ENCOUNTER — Telehealth: Payer: Self-pay

## 2019-04-09 NOTE — Progress Notes (Signed)
Cardiology Office Note:    Date:  04/11/2019   ID:  Donald Oliver, DOB 1970-05-13, MRN [id number]  PCP:  Carlena Hurl, PA-C  Cardiologist:  Shirlee More, MD    Referring MD: Carlena Hurl, PA-C    ASSESSMENT:    1. Coronary artery disease involving native coronary artery of native heart without angina pectoris   2. Combined hyperlipidemia   3. Essential hypertension    PLAN:    In order of problems listed above:  1. Stable following CABG compliant with medications including aspirin antihypertensives beta-blocker and high intensity statin having no angina New [place] Heart Association class I we will continue current treatment stressed the need for lifestyle modification with exercise interrupted by Covid at this time does not need an ischemia evaluation 2. Stable lipids are ideal continue with statin liver function lipids today 3. Well-controlled continue treatment including ACE inhibitor thiazide diuretic beta-blocker calcium channel blocker check renal function potassium today   Next appointment: 1 year   Medication Adjustments/Labs and Tests Ordered: Current medicines are reviewed at length with the patient today.  Concerns regarding medicines are outlined above.  No orders of the defined types were placed in this encounter.  No orders of the defined types were placed in this encounter.   Chief Complaint  Patient presents with  . Follow-up  . Coronary Artery Disease    History of Present Illness:    Donald Oliver is a 49 y.o. male with a hx of coronary artery disease with coronary artery bypass surgery 06/01/2015 left thoracic artery was anastomosed to left anterior descending right internal thoracic artery to diagonal and saphenous vein graft to the right coronary artery.  Other problems include hyperlipidemia glucose intolerance and hypertension.  He was last seen 12/04/2018. 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  No chest pain shortness of breath palpitation or syncope and no recent hospitalization.  Compliant with the statin has had no muscle pain or weakness compliant with aspirin and no GI pain or bleeding Past Medical History:  Diagnosis Date  . CAD (coronary artery disease), native coronary artery 06/01/2015   Cath 3/3 80% LAD, 99% diag, 50% circ, RCA 100% with Lto R collaterals  CABG 06/02/15 Dr. Cyndia Bent with LIMA to LAD, free RIMA to Diag and SVG to RCA    . Combined hyperlipidemia   . FH: premature coronary heart disease    Father had MI at age 4   . GERD (gastroesophageal reflux disease)   . History of substance abuse ([place])    Pt had abused amphetamines : quit in 2005  . Hypertension 2005  . Impaired fasting blood sugar 2016  . Onychomycosis     Past Surgical History:  Procedure Laterality Date  . CARDIAC CATHETERIZATION N/A 05/29/2015   Procedure: Left Heart Cath and Coronary Angiography;  Surgeon: Jettie Booze, MD;  Location: [facility];  Service: Cardiovascular;  Laterality: N/A;  . CARDIAC CATHETERIZATION  05/29/2015   Procedure: Coronary Balloon Angioplasty;  Surgeon: Jettie Booze, MD;  Location: [facility];  Service: Cardiovascular;;  . CORONARY ARTERY BYPASS GRAFT N/A 06/02/2015   Procedure: CORONARY ARTERY BYPASS GRAFTING (CABG) x  3 utilizing bilateral internal mammary artery and endoscopically harvested right sapheneous vein.;  Surgeon: [name], MD;  Location: [facility];  Service: Open Heart Surgery;  Laterality: N/A;  . RHINOPLASTY  [id number]   w/repair of nasal  fractuare; done at [facility]   . TEE WITHOUT CARDIOVERSION N/A 06/02/2015   Procedure: TRANSESOPHAGEAL ECHOCARDIOGRAM (TEE);  Surgeon: [name], MD;  Location: [facility];  Service: Open Heart Surgery;  Laterality: N/A;    Current Medications: Current Meds  Medication Sig  . amLODipine (NORVASC) 10 MG tablet Take 1 tablet (10 mg total) by mouth daily.  Marland Kitchen aspirin 81 MG tablet  Take 81 mg by mouth daily.  Marland Kitchen buPROPion (WELLBUTRIN XL) 300 MG 24 hr tablet TAKE ONE TABLET BY MOUTH DAILY  . lisinopril-hydrochlorothiazide (ZESTORETIC) 20-25 MG tablet Take 1 tablet by mouth daily.  . metoprolol succinate (TOPROL-XL) 25 MG 24 hr tablet Take 1 tablet (25 mg total) by mouth daily.  . nitroGLYCERIN (NITROSTAT) 0.4 MG SL tablet Place 1 tablet (0.4 mg total) under the tongue every 5 (five) minutes as needed for chest pain.  . pantoprazole (PROTONIX) 40 MG tablet Take 1 tablet (40 mg total) by mouth daily.  . rosuvastatin (CRESTOR) 20 MG tablet Take 1 tablet (20 mg total) by mouth daily.  . tadalafil (CIALIS) 5 MG tablet TAKE ONE TABLET BY MOUTH DAILY AS NEEDED FOR ERECTILE DYSFUNCTION  . Testosterone 20.25 MG/ACT (1.62%) GEL PLACE 2 PUMPS ONTO THE SKIN DAILY  . triamcinolone cream (KENALOG) 0.1 % Apply 1 application topically 2 (two) times daily.     Allergies:   Amoxicillin and Fluoxetine   Social History   Socioeconomic History  . Marital status: Married    Spouse name: Not on file  . Number of children: 3  . Years of education: 28   . Highest education level: Not on file  Occupational History  . Occupation: Sports coach    Comment: unemployed   Tobacco Use  . Smoking status: Former Smoker    Packs/day: 1.00    Years: 30.00    Pack years: 30.00    Types: Cigarettes  . Smokeless tobacco: Never Used  Substance and Sexual Activity  . Alcohol use: Yes    Alcohol/week: 1.0 standard drinks    Types: 1 Standard drinks or equivalent per week    Comment: per week occ  . Drug use: Yes    Frequency: 1.0 times per week    Types: Marijuana    Comment: 05/29/2015 "none in the last 10 years"  . Sexual activity: Yes  Other Topics Concern  . Not on file  Social History Narrative   Married has 3 girls ages 12yo, 50yo, 50yo, works at [employer], walks several miles daily at work, hx/o substance abuse in the past   Social Determinants of Systems developer Strain:   . Difficulty of Paying Living Expenses: Not on file  Food Insecurity:   . Worried About Charity fundraiser in the Last Year: Not on file  . Ran Out of Food in the Last Year: Not on file  Transportation Needs:   . Lack of Transportation (Medical): Not on file  . Lack of Transportation (Non-Medical): Not on file  Physical Activity:   . Days of Exercise per Week: Not on file  . Minutes of Exercise per Session: Not on file  Stress:   . Feeling of Stress : Not on file  Social Connections:   . Frequency of Communication with Friends and Family: Not on file  . Frequency of Social Gatherings with Friends and Family: Not on file  . Attends Religious Services: Not on file  . Active Member of Clubs or Organizations: Not on file  .  Attends Archivist Meetings: Not on file  . Marital Status: Not on file     Family History: The patient's family history includes Diabetes in his father; HIV in his sister; Heart disease (age of onset: 31) in his father; Heart failure in his father. ROS:   Please see the history of present illness.    All other systems reviewed and are negative.  EKGs/Labs/Other Studies Reviewed:    The following studies were reviewed today:  EKG:  EKG 12/04/2018 personally reviewed and demonstrates sinus rhythm and is normal  Recent Labs: 06/01/2018: ALT 59; BUN 8; Creatinine, Ser 0.94; Hemoglobin 17.7; Platelets 301; Potassium 3.9; Sodium 142; TSH 1.650  Recent Lipid Panel    Component Value Date/Time   CHOL 156 06/01/2018 1547   TRIG 340 (H) 06/01/2018 1547   HDL 43 06/01/2018 1547   CHOLHDL 3.6 06/01/2018 1547   CHOLHDL 5.6 (H) 10/31/2016 1500   VLDL 74 (H) 10/31/2016 1500   LDLCALC 45 06/01/2018 1547    Physical Exam:    VS:  BP 120/82   Pulse 74   Ht 5\' 9"  (1.753 m)   Wt 205 lb (93 kg)   SpO2 96%   BMI 30.27 kg/m     Wt Readings from Last 3 Encounters:  04/11/19 205 lb (93 kg)  02/13/19 203 lb 6.4 oz (92.3 kg)  12/04/18 206  lb 1.9 oz (93.5 kg)     GEN:  Well nourished, well developed in no acute distress HEENT: Normal NECK: No JVD; No carotid bruits LYMPHATICS: No lymphadenopathy CARDIAC: No xanthoma or xanthelasma RRR, no murmurs, rubs, gallops RESPIRATORY:  Clear to auscultation without rales, wheezing or rhonchi  ABDOMEN: Soft, non-tender, non-distended MUSCULOSKELETAL:  No edema; No deformity  SKIN: Warm and dry NEUROLOGIC:  Alert and oriented x 3 PSYCHIATRIC:  Normal affect    Signed, Shirlee More, MD  04/11/2019 10:53 AM    [place]

## 2019-04-09 NOTE — Telephone Encounter (Signed)

## 2019-04-11 ENCOUNTER — Encounter: Payer: Self-pay | Admitting: Cardiology

## 2019-04-11 ENCOUNTER — Ambulatory Visit (INDEPENDENT_AMBULATORY_CARE_PROVIDER_SITE_OTHER): Payer: Self-pay | Admitting: Cardiology

## 2019-04-11 ENCOUNTER — Other Ambulatory Visit: Payer: Self-pay

## 2019-04-11 VITALS — BP 120/82 | HR 74 | Ht 69.0 in | Wt 205.0 lb

## 2019-04-11 DIAGNOSIS — I2510 Atherosclerotic heart disease of native coronary artery without angina pectoris: Secondary | ICD-10-CM

## 2019-04-11 DIAGNOSIS — E782 Mixed hyperlipidemia: Secondary | ICD-10-CM

## 2019-04-11 DIAGNOSIS — I10 Essential (primary) hypertension: Secondary | ICD-10-CM

## 2019-04-11 NOTE — Patient Instructions (Addendum)
Medication Instructions:  None  *If you need a refill on your cardiac medications before your next appointment, please call your pharmacy*  Lab Work: CMP  Lipids  If you have labs (blood work) drawn today and your tests are completely normal, you will receive your results only by: Marland Kitchen MyChart Message (if you have MyChart) OR . A paper copy in the mail If you have any lab test that is abnormal or we need to change your treatment, we will call you to review the results.  Testing/Procedures: None  Follow-Up: At [facility], you and your health needs are our priority.  As part of our continuing mission to provide you with exceptional heart care, we have created designated Provider Care Teams.  These Care Teams include your primary Cardiologist (physician) and Advanced Practice Providers (APPs -  Physician Assistants and Nurse Practitioners) who all work together to provide you with the care you need, when you need it.  Your next appointment:   1 year(s)  The format for your next appointment:   In Person  Provider:   Shirlee More, MD  Other Instructions See below.    Purchase on line at [employer] or at [employer] on [address]

## 2019-04-12 ENCOUNTER — Ambulatory Visit: Payer: Self-pay | Admitting: Cardiology

## 2019-04-12 LAB — LIPID PANEL
Chol/HDL Ratio: 3.2 ratio (ref 0.0–5.0)
Cholesterol, Total: 157 mg/dL (ref 100–199)
HDL: 49 mg/dL (ref >39)
LDL Chol Calc (NIH): 81 mg/dL (ref 0–99)
Triglycerides: 158 mg/dL — ABNORMAL HIGH (ref 0–149)
VLDL Cholesterol Cal: 27 mg/dL (ref 5–40)

## 2019-04-12 LAB — COMPREHENSIVE METABOLIC PANEL
ALT: 49 IU/L — ABNORMAL HIGH (ref 0–44)
AST: 31 IU/L (ref 0–40)
Albumin/Globulin Ratio: 1.9 (ref 1.2–2.2)
Albumin: 4.6 g/dL (ref 4.0–5.0)
Alkaline Phosphatase: 72 IU/L (ref 39–117)
BUN/Creatinine Ratio: 15 (ref 9–20)
BUN: 16 mg/dL (ref 6–24)
Bilirubin Total: 0.9 mg/dL (ref 0.0–1.2)
CO2: 21 mmol/L (ref 20–29)
Calcium: 9.1 mg/dL (ref 8.7–10.2)
Chloride: 106 mmol/L (ref 96–106)
Creatinine, Ser: 1.07 mg/dL (ref 0.76–1.27)
GFR calc Af Amer: 94 mL/min/{1.73_m2} (ref >59)
GFR calc non Af Amer: 81 mL/min/{1.73_m2} (ref >59)
Globulin, Total: 2.4 g/dL (ref 1.5–4.5)
Glucose: 102 mg/dL — ABNORMAL HIGH (ref 65–99)
Potassium: 3.8 mmol/L (ref 3.5–5.2)
Sodium: 142 mmol/L (ref 134–144)
Total Protein: 7 g/dL (ref 6.0–8.5)

## 2019-05-08 ENCOUNTER — Ambulatory Visit: Payer: Medicaid Other | Attending: Internal Medicine

## 2019-05-08 DIAGNOSIS — Z20822 Contact with and (suspected) exposure to covid-19: Secondary | ICD-10-CM

## 2019-05-09 ENCOUNTER — Telehealth: Payer: Medicaid Other | Admitting: Physician Assistant

## 2019-05-09 DIAGNOSIS — R300 Dysuria: Secondary | ICD-10-CM

## 2019-05-09 LAB — NOVEL CORONAVIRUS, NAA: SARS-CoV-2, NAA: NOT DETECTED

## 2019-05-09 NOTE — Progress Notes (Signed)
Based on what you shared with me, I feel your condition warrants further evaluation and I recommend that you be seen for a face to face office visit.  Male bladder infections are not very common.  We worry about prostate or kidney conditions.  The standard of care is to examine the abdomen and kidneys, and to do a urine and blood test to make sure that something more serious is not going on.  We recommend that you see a provider today.  If your doctor's office is closed [place] has the following Urgent Cares:    NOTE: If you entered your credit card information for this eVisit, you will not be charged. You may see a "hold" on your card for the $35 but that hold will drop off and you will not have a charge processed.   If you are having a true medical emergency please call 911.     For an urgent face to face visit, [place] has four urgent care centers for your convenience:    NEW:  [facility]gent [place] [address] [place], [place] [address] .  Monday - Friday 10 am - 6 pm    . [facility]    [phone]                  Get Driving Directions  [id number] [address] [place], [place] [address] . 10 am to 8 pm Monday-Friday . 12 pm to 8 pm Saturday-Sunday   . [facility] at [place]                  Get Driving Directions  [id number] [place], [place], [place] [address] . 8 am to 8 pm Monday-Friday . 9 am to 6 pm Saturday . 11 am to 6 pm Sunday   . [facility] at [place]                  Get Driving Directions   [address] [place], [place] [address] . 8 am to 8 pm Monday-Friday . 8 am to 4 pm Saturday-Sunday    . [facility] at [place]                    Get Driving Directions  [id number]  [address], [place], [place] [address]  . Monday-Friday, 12 PM to 6 PM    Your e-visit  answers were reviewed by a board certified advanced clinical practitioner to complete your personal care plan.  Thank you for using e-Visits.  Approximately 5 minutes was spent documenting and reviewing patient's chart.

## 2019-05-23 ENCOUNTER — Ambulatory Visit (INDEPENDENT_AMBULATORY_CARE_PROVIDER_SITE_OTHER): Payer: Self-pay | Admitting: Medical

## 2019-05-23 ENCOUNTER — Encounter: Payer: Self-pay | Admitting: Medical

## 2019-05-23 ENCOUNTER — Telehealth: Payer: Self-pay

## 2019-05-23 ENCOUNTER — Other Ambulatory Visit: Payer: Self-pay

## 2019-05-23 VITALS — BP 122/70 | HR 75 | Temp 98.4°F | Ht 69.0 in | Wt 210.8 lb

## 2019-05-23 DIAGNOSIS — Q5323 Bilateral high scrotal testes: Secondary | ICD-10-CM

## 2019-05-23 DIAGNOSIS — R300 Dysuria: Secondary | ICD-10-CM

## 2019-05-23 HISTORY — DX: Bilateral high scrotal testes: Q53.23

## 2019-05-23 LAB — POCT URINALYSIS DIP (PROADVANTAGE DEVICE)
Glucose, UA: NEGATIVE mg/dL
Ketones, POC UA: NEGATIVE mg/dL
Nitrite, UA: NEGATIVE
Specific Gravity, Urine: 1.03
Urobilinogen, Ur: NEGATIVE
pH, UA: 6 (ref 5.0–8.0)

## 2019-05-23 MED ORDER — TESTOSTERONE 20.25 MG/ACT (1.62%) TD GEL: TRANSDERMAL | 0 refills | Status: DC

## 2019-05-23 MED ORDER — NITROFURANTOIN MONOHYD MACRO 100 MG PO CAPS: 100.0000 mg | ORAL_CAPSULE | Freq: Two times a day (BID) | ORAL | 0 refills | Status: DC

## 2019-05-23 MED ORDER — SULFAMETHOXAZOLE-TRIMETHOPRIM 800-160 MG PO TABS: 1.0000 | ORAL_TABLET | Freq: Two times a day (BID) | ORAL | 0 refills | Status: DC

## 2019-05-23 NOTE — Telephone Encounter (Signed)
done

## 2019-05-23 NOTE — Progress Notes (Signed)
Subjective:  Donald Oliver is a 49 y.o. male who presents for Chief Complaint  Patient presents with  . Urinary Retention    burnig with urination with the urge to go      Within 3-4 weeks ago started having stinging sensation when he urinates.  Feels the urge to go afterwards.  When he has the urge, he has to go right away.  At times has felt feverish.  No blood urine.  Urine may looks a little cloudy.  No particular urine odor.  Urinating a normal amount per day, but has urgency and sensation of frequency.  No testicle pain or swelling, no related lower abdomen or lower back pain.  No perineal discomfort.  No genital sores or rash.   No penile discharge.  Divorced.   Last sexual activity months ago.   No concern for STD.  No prior similar.  He thinks he has prostatitis 15 years ago.  No other aggravating or relieving factors.  No other c/o.  Past Medical History:  Diagnosis Date  . CAD (coronary artery disease), native coronary artery 06/01/2015   Cath 3/3 80% LAD, 99% diag, 50% circ, RCA 100% with Lto R collaterals  CABG 06/02/15 Dr. Cyndia Bent with LIMA to LAD, free RIMA to Diag and SVG to RCA    . Combined hyperlipidemia   . FH: premature coronary heart disease    Father had MI at age 94   . GERD (gastroesophageal reflux disease)   . History of substance abuse ([place])    Pt had abused amphetamines : quit in 2005  . Hypertension 2005  . Impaired fasting blood sugar 2016  . Onychomycosis    Current Outpatient Medications on File Prior to Visit  Medication Sig Dispense Refill  . amLODipine (NORVASC) 10 MG tablet Take 1 tablet (10 mg total) by mouth daily. 90 tablet 1  . aspirin 81 MG tablet Take 81 mg by mouth daily.    Marland Kitchen buPROPion (WELLBUTRIN XL) 300 MG 24 hr tablet TAKE ONE TABLET BY MOUTH DAILY 90 tablet 0  . lisinopril-hydrochlorothiazide (ZESTORETIC) 20-25 MG tablet Take 1 tablet by mouth daily. 90 tablet 1  . metoprolol succinate (TOPROL-XL) 25 MG 24 hr tablet Take 1 tablet (25 mg total)  by mouth daily. 90 tablet 1  . nitroGLYCERIN (NITROSTAT) 0.4 MG SL tablet Place 1 tablet (0.4 mg total) under the tongue every 5 (five) minutes as needed for chest pain. 90 tablet 3  . pantoprazole (PROTONIX) 40 MG tablet Take 1 tablet (40 mg total) by mouth daily. 90 tablet 3  . rosuvastatin (CRESTOR) 20 MG tablet Take 1 tablet (20 mg total) by mouth daily. 90 tablet 1  . tadalafil (CIALIS) 5 MG tablet TAKE ONE TABLET BY MOUTH DAILY AS NEEDED FOR ERECTILE DYSFUNCTION 30 tablet 2  . triamcinolone cream (KENALOG) 0.1 % Apply 1 application topically 2 (two) times daily. 30 g 1   No current facility-administered medications on file prior to visit.     The following portions of the patient's history were reviewed and updated as appropriate: allergies, current medications, past family history, past medical history, past social history, past surgical history and problem list.  ROS Otherwise as in subjective above  Objective: BP 122/70   Pulse 75   Temp 98.4 F (36.9 C)   Ht 5\' 9"  (1.753 m)   Wt 210 lb 12.8 oz (95.6 kg)   SpO2 96%   BMI 31.13 kg/m   General appearance: alert, no distress, well  developed, well nourished Abdomen nontender, no mass, no organomegaly GU: testes not descended , high riding but he notes this has been chronic for years, penis circumcised, no tendnerss, no swelling, no lesions, no rash, no lymphadenopathy    Assessment: Encounter Diagnoses  Name Primary?  . Dysuria Yes  . Burning with urination   . Bilateral high scrotal testes      Plan: Discussed symptoms, differential, and urinalysis suggest urinary tract infection.  We will send for culture.  Begin Macrobid.  Discussed water intake.  Discussed possible worsening symptoms that would prompt a recheck.  In general needs to use more water intake, less caffeine.    Pharmacy called back warning of interaction between Bactrim and lisinopril HCT due to hypokalemia thus we changed to [facility] was  seen today for urinary retention.  Diagnoses and all orders for this visit:  Dysuria -     Urine Culture -     POCT Urinalysis DIP (Proadvantage Device)  Burning with urination -     Urine Culture -     POCT Urinalysis DIP (Proadvantage Device)  Bilateral high scrotal testes  Other orders -     Discontinue: Testosterone 20.25 MG/ACT (1.62%) GEL; PLACE 2 PUMPS ONTO THE SKIN DAILY -     Discontinue: sulfamethoxazole-trimethoprim (BACTRIM DS) 800-160 MG tablet; Take 1 tablet by mouth 2 (two) times daily. -     Testosterone 20.25 MG/ACT (1.62%) GEL; PLACE 2 PUMPS ONTO THE SKIN DAILY -     Discontinue: sulfamethoxazole-trimethoprim (BACTRIM DS) 800-160 MG tablet; Take 1 tablet by mouth 2 (two) times daily. -     nitrofurantoin, macrocrystal-monohydrate, (MACROBID) 100 MG capsule; Take 1 capsule (100 mg total) by mouth 2 (two) times daily.    Follow up: pending culture

## 2019-05-23 NOTE — Telephone Encounter (Signed)
Pt. Just called back stating if you could send his prescription's into the [facility] on [address] instead of the [facility] on [address] [place].

## 2019-05-24 ENCOUNTER — Telehealth: Payer: Self-pay

## 2019-05-24 NOTE — Telephone Encounter (Signed)
Spoke to pt concerning med that were prescribed yesterday. Pt need to know the name if the abx due to pharmacy having two abx on file . [place]

## 2019-05-25 LAB — URINE CULTURE

## 2019-06-09 ENCOUNTER — Other Ambulatory Visit: Payer: Self-pay | Admitting: Medical

## 2019-06-13 ENCOUNTER — Encounter: Payer: Medicaid Other | Admitting: Medical

## 2019-07-19 ENCOUNTER — Encounter: Payer: Self-pay | Admitting: Medical

## 2019-07-19 ENCOUNTER — Other Ambulatory Visit: Payer: Self-pay

## 2019-07-19 ENCOUNTER — Ambulatory Visit (INDEPENDENT_AMBULATORY_CARE_PROVIDER_SITE_OTHER): Payer: Self-pay | Admitting: Medical

## 2019-07-19 VITALS — BP 128/80 | HR 77 | Temp 98.0°F | Ht 69.0 in | Wt 214.2 lb

## 2019-07-19 DIAGNOSIS — R7989 Other specified abnormal findings of blood chemistry: Secondary | ICD-10-CM

## 2019-07-19 DIAGNOSIS — G4733 Obstructive sleep apnea (adult) (pediatric): Secondary | ICD-10-CM

## 2019-07-19 DIAGNOSIS — Z8249 Family history of ischemic heart disease and other diseases of the circulatory system: Secondary | ICD-10-CM

## 2019-07-19 DIAGNOSIS — D179 Benign lipomatous neoplasm, unspecified: Secondary | ICD-10-CM | POA: Insufficient documentation

## 2019-07-19 DIAGNOSIS — N529 Male erectile dysfunction, unspecified: Secondary | ICD-10-CM

## 2019-07-19 DIAGNOSIS — Z951 Presence of aortocoronary bypass graft: Secondary | ICD-10-CM

## 2019-07-19 DIAGNOSIS — R5383 Other fatigue: Secondary | ICD-10-CM

## 2019-07-19 DIAGNOSIS — Z125 Encounter for screening for malignant neoplasm of prostate: Secondary | ICD-10-CM

## 2019-07-19 DIAGNOSIS — R4589 Other symptoms and signs involving emotional state: Secondary | ICD-10-CM | POA: Insufficient documentation

## 2019-07-19 DIAGNOSIS — N400 Enlarged prostate without lower urinary tract symptoms: Secondary | ICD-10-CM

## 2019-07-19 DIAGNOSIS — Z0000 Encounter for general adult medical examination without abnormal findings: Secondary | ICD-10-CM

## 2019-07-19 DIAGNOSIS — Z9989 Dependence on other enabling machines and devices: Secondary | ICD-10-CM

## 2019-07-19 DIAGNOSIS — L910 Hypertrophic scar: Secondary | ICD-10-CM

## 2019-07-19 DIAGNOSIS — K760 Fatty (change of) liver, not elsewhere classified: Secondary | ICD-10-CM

## 2019-07-19 DIAGNOSIS — L309 Dermatitis, unspecified: Secondary | ICD-10-CM

## 2019-07-19 DIAGNOSIS — I2510 Atherosclerotic heart disease of native coronary artery without angina pectoris: Secondary | ICD-10-CM

## 2019-07-19 DIAGNOSIS — I10 Essential (primary) hypertension: Secondary | ICD-10-CM

## 2019-07-19 DIAGNOSIS — E782 Mixed hyperlipidemia: Secondary | ICD-10-CM

## 2019-07-19 DIAGNOSIS — L608 Other nail disorders: Secondary | ICD-10-CM

## 2019-07-19 DIAGNOSIS — Z87891 Personal history of nicotine dependence: Secondary | ICD-10-CM

## 2019-07-19 DIAGNOSIS — R7301 Impaired fasting glucose: Secondary | ICD-10-CM

## 2019-07-19 DIAGNOSIS — K219 Gastro-esophageal reflux disease without esophagitis: Secondary | ICD-10-CM

## 2019-07-19 DIAGNOSIS — Q5323 Bilateral high scrotal testes: Secondary | ICD-10-CM

## 2019-07-19 HISTORY — DX: Benign lipomatous neoplasm, unspecified: D17.9

## 2019-07-19 HISTORY — DX: Other symptoms and signs involving emotional state: R45.89

## 2019-07-19 MED ORDER — CITALOPRAM HYDROBROMIDE 20 MG PO TABS: 20.0000 mg | ORAL_TABLET | Freq: Every day | ORAL | 1 refills | Status: DC

## 2019-07-19 NOTE — Addendum Note (Signed)
Addended by: Carlena Hurl on: 07/19/2019 10:13 AM   Modules accepted: Orders

## 2019-07-19 NOTE — Patient Instructions (Signed)
Preventative Care for Adults - Male    Thank you for coming in for your well visit today, and thank you for trusting [place] with your care!   Maintain regular health and wellness exams:  A routine yearly physical is a good way to check in with your primary care provider about your health and preventive screening. It is also an opportunity to share updates about your health and any concerns you have, and receive a thorough all-over exam.   Most health insurance companies pay for at least some preventative services.  Check with your health plan for specific coverages.  What preventative services do men need?  Adult men should have their weight and blood pressure checked regularly.   Men age 32 and older should have their cholesterol levels checked regularly.  Beginning at age 28 and continuing to age 57, men should be screened for colorectal cancer.  Certain people may need continued testing until age 46.  Updating vaccinations is part of preventative care.  Vaccinations help protect against diseases such as the flu.  Osteoporosis is a disease in which the bones lose minerals and strength as we age. Men ages 67 and over should discuss this with their caregivers  Lab tests are generally done as part of preventative care to screen for anemia and blood disorders, to screen for problems with the kidneys and liver, to screen for bladder problems, to check blood sugar, and to check your cholesterol level.  Preventative services generally include counseling about diet, exercise, avoiding tobacco, drugs, excessive alcohol consumption, and sexually transmitted infections.   Xrays and CT scans are not normally done as a preventative test, and most insurances do not pay for imaging for screening other than as discussed under cancer screens below.   On the other hand, if you have certain medical concerns, imaging may be necessary as a diagnostic test.    Your Medical Team Your medical team starts with  [place], your PCP or primary care provider.  Please use our services for your routine care such as physicals, screenings, immunizations, sick visits, and your first stop for general medical concerns.  You can call our number for after hours information for urgent questions that may need attention but cannot wait til the next business day.    Urgent care-urgent cares exist to provide care when your primary care office would typically be closed such as evenings or weekends.   Urgent care is for evaluation of urgent medical problems that do not necessarily require emergency department care, but cannot wait til the next business day when we are open.  Emergency department care-please reserve emergency department care for serious, urgent, possibly life-threatening medical problems.  This includes issues like possible stroke, heart attack, significant injury, mental health crisis, or other urgent need that requires immediate medical attention.     See your dentist office twice yearly for hygiene and cleaning visits.   Brush your teeth and floss your teeth daily.  See your eye doctor yearly for routine eye exam and screenings for glaucoma and retinal disease.   Specific Concerns: Work on losing weight through low sugar low fat diet  Exercise most day per week   Vaccines:  Stay up to date with your tetanus shots and other required immunizations. You should have a booster for tetanus every 10 years. Be sure to get your flu shot every year, since 5%-20% of the [place] population comes down with the flu. The flu vaccine changes each year, so being vaccinated once  is not enough. Get your shot in the fall, before the flu season peaks.   Other vaccines to consider:  Pneumococcal vaccine to protect against certain types of pneumonia.  This is normally recommended for adults age 33 or older.  However, adults younger than 50 years old with certain underlying conditions such as diabetes, heart or lung disease should  also receive the vaccine.  Shingles vaccine to protect against Varicella Zoster if you are older than age 90, or younger than 50 years old with certain underlying illness.  If you have not had the Shingrix vaccine, please call your insurer to inquire about coverage for the Shingrix vaccine given in 2 doses.   Some insurers cover this vaccine after age 58, some cover this after age 41.  If your insurer covers this, then call to schedule appointment to have this vaccine here  Hepatitis A vaccine to protect against a form of infection of the liver by a virus acquired from food.  Hepatitis B vaccine to protect against a form of infection of the liver by a virus acquired from blood or body fluids, particularly if you work in health care.  If you plan to travel internationally, check with your local health department for specific vaccination recommendations.  Human Papilloma Virus or HPV causes cancer of the cervix, and other infections that can be transmitted from person to person. There is a vaccine for HPV, and males should get immunized between the ages of 58 and 41. It requires a series of 3 shots.   Covid/Coronavirus - as the vaccines are becoming available, please consider vaccination if you are a health care worker, first responder, or have significant health problems such as asthma, COPD, heart disease, hypertension, diabetes, obesity, multiple medical problems, over age 72yo, or immunocompromised.    You are up to date on Tetanus booster and pneumonia 23 vaccine  I recommend the following vaccines: Covid  Prevnar 13 pneumonia vaccine Shingrix shingles vaccine Yearly flu shot    What should I know about Cancer screening? Many types of cancers can be detected early and may often be prevented. Lung Cancer  You should be screened every year for lung cancer if: ? You are a current smoker who has smoked for at least 30 years. ? You are a former smoker who has quit within the past 15  years.  Talk to your health care provider about your screening options, when you should start screening, and how often you should be screened.  Colorectal Cancer  Routine colorectal cancer screening usually begins at 50 years of age and should be repeated every 5-10 years until you are 50 years old. You may need to be screened more often if early forms of precancerous polyps or small growths are found. Your health care provider may recommend screening at an earlier age if you have risk factors for colon cancer.  Your health care provider may recommend using home test kits to check for hidden blood in the stool.  A small camera at the end of a tube can be used to examine your colon (sigmoidoscopy or colonoscopy). This checks for the earliest forms of colorectal cancer.  Prostate and Testicular Cancer  Depending on your age and overall health, your health care provider may do certain tests to screen for prostate and testicular cancer.  Talk to your health care provider about any symptoms or concerns you have about testicular or prostate cancer.  Skin Cancer  Check your skin from head to toe regularly.  Tell your health care provider about any new moles or changes in moles, especially if: ? There is a change in a mole's size, shape, or color. ? You have a mole that is larger than a pencil eraser.  Always use sunscreen. Apply sunscreen liberally and repeat throughout the day.  Protect yourself by wearing long sleeves, pants, a wide-brimmed hat, and sunglasses when outside.   Are you up to date on cancer screenings:  COLON CANCER SCREENING:  Due now  PROSTATE CANCER SCREENING:  today  TESTICULAR CANCER SCREENING:  check your self monthly at home     [place]:  Healthy diet:  Eat a variety of foods, including fruit, vegetables, animal or vegetable protein, such as meat, fish, chicken, and eggs, or beans, lentils, tofu, and grains, such as  rice.  Drink plenty of water daily.  Decrease saturated fat in the diet, avoid lots of red meat, processed foods, sweets, fast foods, and fried foods.  Exercise:  Aerobic exercise helps maintain good heart health. At least 30-40 minutes of moderate-intensity exercise is recommended. For example, a brisk walk that increases your heart rate and breathing. This should be done on most days of the week.   Find a type of exercise or a variety of exercises that you enjoy so that it becomes a part of your daily life.  Examples are running, walking, swimming, water aerobics, and biking.  For motivation and support, explore group exercise such as aerobic class, spin class, Zumba, Yoga,or  martial arts, etc.    Set exercise goals for yourself, such as a certain weight goal, walk or run in a race such as a 5k walk/run.  Speak to your primary care provider about exercise goals.  Your weight readings per our records: Wt Readings from Last 3 Encounters:  07/19/19 214 lb 3.2 oz (97.2 kg)  05/23/19 210 lb 12.8 oz (95.6 kg)  04/11/19 205 lb (93 kg)    Body mass index is 31.63 kg/m.    Disease prevention:  If you smoke or chew tobacco, find out from your caregiver how to quit. It can literally save your life, no matter how long you have been a tobacco user. If you do not use tobacco, never begin.   Maintain a healthy diet and normal weight. Increased weight leads to problems with blood pressure and diabetes.   The Body Mass Index or BMI is a way of measuring how much of your body is fat. Having a BMI above 27 increases the risk of heart disease, diabetes, hypertension, stroke and other problems related to obesity. Your caregiver can help determine your BMI and based on it develop an exercise and dietary program to help you achieve or maintain this important measurement at a healthful level.  High blood pressure causes heart and blood vessel problems.  Persistent high blood pressure should be treated  with medicine if weight loss and exercise do not work.  Your blood pressure readings per our records:     BP Readings from Last 3 Encounters:  07/19/19 128/80  05/23/19 122/70  04/11/19 120/82     Fat and cholesterol leaves deposits in your arteries that can block them. This causes heart disease and vessel disease elsewhere in your body.  If your cholesterol is found to be high, or if you have heart disease or certain other medical conditions, then you may need to have your cholesterol monitored frequently and be treated with medication.   Ask if you should  have a cardiac stress test if your history suggests this. A stress test is a test done on a treadmill that looks for heart disease. This test can find disease prior to there being a problem.   Osteoporosis is a disease in which the bones lose minerals and strength as we age. This can result in serious bone fractures. Risk of osteoporosis can be identified using a bone density scan. Men ages 9 and over should discuss this with their caregivers. Ask your caregiver whether you should be taking a calcium supplement and Vitamin D, to reduce the rate of osteoporosis.   Avoid drinking alcohol in excess (more than two drinks per day).  Avoid use of street drugs. Do not share needles with anyone. Ask for professional help if you need assistance or instructions on stopping the use of alcohol, cigarettes, and/or drugs.  Brush your teeth twice a day with fluoride toothpaste, and floss once a day. Good oral hygiene prevents tooth decay and gum disease. The problems can be painful, unattractive, and can cause other health problems. Visit your dentist for a routine oral and dental check up and preventive care every 6-12 months.   Safety:  Use seatbelts 100% of the time, whether driving or as a passenger.  Use safety devices such as hearing protection if you work in environments with loud noise or significant background noise.  Use safety glasses when  doing any work that could send debris in to the eyes.  Use a helmet if you ride a bike or motorcycle.  Use appropriate safety gear for contact sports.  Talk to your caregiver about gun safety.  Use sunscreen with a SPF (or skin protection factor) of 15 or greater.  Lighter skinned people are at a greater risk of skin cancer. Don't forget to also wear sunglasses in order to protect your eyes from too much damaging sunlight. Damaging sunlight can accelerate cataract formation.   Keep carbon monoxide and smoke detectors in your home functioning at all times. Change the batteries every 6 months or use a model that plugs into the wall.    Sexual activity: . Sex is a normal part of life and sexual activity can continue into older adulthood for many healthy people.   . If you are having erectile dysfunction issues, please follow up to discuss this further.   . If you are not in a monogamous relationship or have more than one partner, please practice safe sex.  Use condoms. Condoms are used for birth control and to help reduce the spread of sexually transmitted infections (or STIs).  Some of the STIs are gonorrhea (the clap), chlamydia, syphilis, trichomonas, herpes, HPV (human papilloma virus) and HIV (human immunodeficiency virus) which causes AIDS. The herpes, HIV and HPV are viral illnesses that have no cure. These can result in disability, cancer and death.   We are able to test for STIs here at our office.

## 2019-07-19 NOTE — Progress Notes (Addendum)
Subjective:   HPI  Donald Oliver is a 49 y.o. male who presents for Chief Complaint  Patient presents with  . Annual Exam    with fasting labs   and med check  Patient Care Team: Chioma Mukherjee, Camelia Eng, PA-C as PCP - General (Family Medicine) Sees dentist Sees eye doctor Dr. Shirlee More, cardiology   Concerns: Hard time losing weight  Reviewed their medical, surgical, family, social, medication, and allergy history and updated chart as appropriate.  Past Medical History:  Diagnosis Date  . CAD (coronary artery disease), native coronary artery 06/01/2015   Cath 3/3 80% LAD, 99% diag, 50% circ, RCA 100% with Lto R collaterals  CABG 06/02/15 Dr. Cyndia Bent with LIMA to LAD, free RIMA to Diag and SVG to RCA    . Combined hyperlipidemia   . FH: premature coronary heart disease    Father had MI at age 28   . GERD (gastroesophageal reflux disease)   . History of substance abuse ([place])    Pt had abused amphetamines : quit in 2005  . Hypertension 2005  . Impaired fasting blood sugar 2016  . Onychomycosis     Past Surgical History:  Procedure Laterality Date  . CARDIAC CATHETERIZATION N/A 05/29/2015   Procedure: Left Heart Cath and Coronary Angiography;  Surgeon: Jettie Booze, MD;  Location: [facility];  Service: Cardiovascular;  Laterality: N/A;  . CARDIAC CATHETERIZATION  05/29/2015   Procedure: Coronary Balloon Angioplasty;  Surgeon: Jettie Booze, MD;  Location: [facility];  Service: Cardiovascular;;  . CORONARY ARTERY BYPASS GRAFT N/A 06/02/2015   Procedure: CORONARY ARTERY BYPASS GRAFTING (CABG) x  3 utilizing bilateral internal mammary artery and endoscopically harvested right sapheneous vein.;  Surgeon: [name], MD;  Location: [facility];  Service: Open Heart Surgery;  Laterality: N/A;  . RHINOPLASTY  [id number]   w/repair of nasal fractuare; done at [facility]   . TEE WITHOUT CARDIOVERSION N/A 06/02/2015   Procedure: TRANSESOPHAGEAL ECHOCARDIOGRAM (TEE);   Surgeon: [name], MD;  Location: [facility];  Service: Open Heart Surgery;  Laterality: N/A;    Social History   Socioeconomic History  . Marital status: Legally Separated    Spouse name: Not on file  . Number of children: 3  . Years of education: 60   . Highest education level: Not on file  Occupational History  . Occupation: Sports coach    Comment: unemployed   Tobacco Use  . Smoking status: Former Smoker    Packs/day: 1.00    Years: 30.00    Pack years: 30.00    Types: Cigarettes  . Smokeless tobacco: Never Used  Substance and Sexual Activity  . Alcohol use: Yes    Alcohol/week: 1.0 standard drinks    Types: 1 Standard drinks or equivalent per week    Comment: rarely  . Drug use: Not Currently    Frequency: 1.0 times per week    Types: Marijuana    Comment: 05/29/2015 "none in the last 10 years"  . Sexual activity: Yes  Other Topics Concern  . 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   06/2019   Social Determinants of Health   Financial Resource Strain:   . Difficulty of Paying Living Expenses:   Food Insecurity:   . Worried About Estate manager/land agent  of Food in the Last Year:   . [place] in the Last Year:   Transportation Needs:   . Lack of Transportation (Medical):   Marland Kitchen Lack of Transportation (Non-Medical):   Physical Activity:   . Days of Exercise per Week:   . Minutes of Exercise per Session:   Stress:   . Feeling of Stress :   Social Connections:   . Frequency of Communication with Friends and Family:   . Frequency of Social Gatherings with Friends and Family:   . Attends Religious Services:   . Active Member of Clubs or Organizations:   . Attends Archivist Meetings:   Marland Kitchen Marital Status:   Intimate Partner Violence:   . Fear of Current or Ex-Partner:   . Emotionally Abused:    Marland Kitchen Physically Abused:   . Sexually Abused:     Family History  Problem Relation Age of Onset  . Heart failure Father   . Heart disease Father 40       died of MI  . Diabetes Father   . Heart disease Mother        CAD, stents  . Diabetes Mother   . HIV Sister   . Cancer Maternal Aunt        lung, smoker  . Stroke Neg Hx      Current Outpatient Medications:  .  amLODipine (NORVASC) 10 MG tablet, Take 1 tablet (10 mg total) by mouth daily., Disp: 90 tablet, Rfl: 1 .  aspirin 81 MG tablet, Take 81 mg by mouth daily., Disp: , Rfl:  .  buPROPion (WELLBUTRIN XL) 300 MG 24 hr tablet, TAKE ONE TABLET BY MOUTH DAILY, Disp: 90 tablet, Rfl: 0 .  lisinopril-hydrochlorothiazide (ZESTORETIC) 20-25 MG tablet, Take 1 tablet by mouth daily., Disp: 90 tablet, Rfl: 1 .  metoprolol succinate (TOPROL-XL) 25 MG 24 hr tablet, Take 1 tablet (25 mg total) by mouth daily., Disp: 90 tablet, Rfl: 1 .  pantoprazole (PROTONIX) 40 MG tablet, Take 1 tablet (40 mg total) by mouth daily., Disp: 90 tablet, Rfl: 3 .  rosuvastatin (CRESTOR) 20 MG tablet, Take 1 tablet (20 mg total) by mouth daily., Disp: 90 tablet, Rfl: 1 .  tadalafil (CIALIS) 5 MG tablet, TAKE ONE TABLET BY MOUTH DAILY AS NEEDED FOR ERECTILE DYSFUNCTION, Disp: 30 tablet, Rfl: 2 .  Testosterone 20.25 MG/ACT (1.62%) GEL, PLACE 2 PUMPS ONTO THE SKIN DAILY, Disp: 75 g, Rfl: 0 .  nitrofurantoin, macrocrystal-monohydrate, (MACROBID) 100 MG capsule, Take 1 capsule (100 mg total) by mouth 2 (two) times daily. (Patient not taking: Reported on 07/19/2019), Disp: 14 capsule, Rfl: 0 .  nitroGLYCERIN (NITROSTAT) 0.4 MG SL tablet, Place 1 tablet (0.4 mg total) under the tongue every 5 (five) minutes as needed for chest pain. (Patient not taking: Reported on 07/19/2019), Disp: 90 tablet, Rfl: 3 .  triamcinolone cream (KENALOG) 0.1 %, Apply 1 application topically 2 (two) times daily. (Patient not taking: Reported on 07/19/2019), Disp: 30 g, Rfl: 1  Allergies   Allergen Reactions  . Amoxicillin Swelling    Hand swelling - as a teenager Has patient had a PCN reaction causing immediate rash, facial/tongue/throat swelling, SOB or lightheadedness with hypotension: Yes Has patient had a PCN reaction causing severe rash involving mucus membranes or skin necrosis: No Has patient had a PCN reaction that required hospitalization No Has patient had a PCN reaction occurring within the last 10 years: No If all of the above answers are "NO", then may  proceed with Cephalosporin use.  . Fluoxetine     Cloudy mentation, sexual side effects      Review of Systems Constitutional: -fever, -chills, -sweats, -unexpected weight change, -decreased appetite, -fatigue Allergy: -sneezing, -itching, -congestion Dermatology: -changing moles, --rash, -lumps ENT: -runny nose, -ear pain, -sore throat, -hoarseness, -sinus pain, -teeth pain, - ringing in ears, -hearing loss, -nosebleeds Cardiology: -chest pain, -palpitations, -swelling, -difficulty breathing when lying flat, -waking up short of breath Respiratory: -cough, -shortness of breath, -difficulty breathing with exercise or exertion, -wheezing, -coughing up blood Gastroenterology: -abdominal pain, -nausea, -vomiting, -diarrhea, -constipation, -blood in stool, -changes in bowel movement, -difficulty swallowing or eating Hematology: -bleeding, -bruising  Musculoskeletal: -joint aches, -muscle aches, -joint swelling, -back pain, -neck pain, -cramping, -changes in gait Ophthalmology: denies vision changes, eye redness, itching, discharge Urology: -burning with urination, -difficulty urinating, -blood in urine, -urinary frequency, -urgency, -incontinence Neurology: -headache, -weakness, -tingling, -numbness, -memory loss, -falls, -dizziness Psychology: -depressed mood, -agitation, -sleep problems Male GU: no testicular mass, pain, no lymph nodes swollen, no swelling, no rash.     Objective:  BP 128/80   Pulse 77    Temp 98 F (36.7 C)   Ht 5\' 9"  (1.753 m)   Wt 214 lb 3.2 oz (97.2 kg)   SpO2 97%   BMI 31.63 kg/m   General appearance: alert, no distress, WD/WN, Caucasian male Skin: right buttock with 78mm diameter red/brown lesion, globular with single round pink 49mm papule within the other lesion with hair gowning out, unchanged per patient, left upper back over scapula with 5-6 cm broad raised lipoma Neck: supple, no lymphadenopathy, no thyromegaly, no masses, normal ROM, no bruits Chest: vertical CABG scar, non tender, normal shape and expansion Heart: RRR, normal S1, S2, no murmurs Lungs: CTA bilaterally, no wheezes, rhonchi, or rales Abdomen: +bs, soft, non tender, non distended, no masses, no hepatomegaly, no splenomegaly, no bruits Back: non tender, normal ROM, no scoliosis Musculoskeletal: upper extremities non tender, no obvious deformity, normal ROM throughout, lower extremities non tender, no obvious deformity, normal ROM throughout Extremities: no edema, no cyanosis, no clubbing Pulses: 2+ symmetric, upper and lower extremities, normal cap refill Neurological: alert, oriented x 3, CN2-12 intact, strength normal upper extremities and lower extremities, sensation normal throughout, DTRs 2+ throughout, no cerebellar signs, gait normal Psychiatric: normal affect, behavior normal, pleasant  GU: normal male external genitalia, high riding testes bilat, nontender, no masses, no hernia, no lymphadenopathy Rectal: anus with small tag, otherwise normal tone, prostate WNL    Assessment and Plan :   Encounter Diagnoses  Name Primary?  . Essential hypertension Yes  . Encounter for health maintenance examination in adult   . Coronary artery disease involving native coronary artery of native heart without angina pectoris   . OSA on CPAP   . Gastroesophageal reflux disease, unspecified whether esophagitis present   . Fatty liver disease, nonalcoholic   . Impaired fasting blood sugar   . Nail  deformity   . Keloid scar of skin   . Eczema, unspecified type   . Screening for prostate cancer   . Erectile dysfunction, unspecified erectile dysfunction type   . Family history of premature CAD   . Combined hyperlipidemia   . Bilateral high scrotal testes   . Benign prostatic hyperplasia, unspecified whether lower urinary tract symptoms present   . Mixed dyslipidemia   . Elevated LFTs   . Fatigue, unspecified type   . Low testosterone in male   . Former smoker   . S/P  CABG x 3   . Lipoma, unspecified site     Physical exam - discussed and counseled on healthy lifestyle, diet, exercise, preventative care, vaccinations, sick and well care, proper use of emergency dept and after hours care, and addressed their concerns.    Health screening: See your eye doctor yearly for routine vision care. See your dentist yearly for routine dental care including hygiene visits twice yearly.  Cancer screening Advised Colonoscopy.  Declines due to no insurance currently  Discussed PSA, prostate exam, and prostate cancer screening risks/benefits.     Vaccinations: Advised yearly influenza vaccine, covid vaccine, Prevnar 22, Shingrix  He will consider  He is up to date on Pneumococcal 23 and Tdap   Acute issues discussed: none  Separate significant chronic issues discussed: Impaired glucose-labs today  CAD, history of CABG, history of MI -managed by cardiology  Reviewed recent labs through cardiology including lipid and CMET  Fatty liver disease-discussed need to lose weight through healthy diet exercise, discussed possible complications of diabetes disease  OSA-continue CPAP  Erectile dysfunction-doing fine on current medication  Low testosterone-labs today for surveillance, continue 2 pumps daily 1.62% gel  Lipoma-unchanged, not bothersome  He uses vape occasionally.  No longer cigarette smoking.  Depressed mood - c/t Wellbutrin but add trial of Citalopram.  Discussed  risks/benefits of medication.  Plan call report 2 weeks.   [place] was seen today for annual exam.  Diagnoses and all orders for this visit:  Essential hypertension  Encounter for health maintenance examination in adult -     Testosterone -     PSA -     CBC -     Hemoglobin A1c  Coronary artery disease involving native coronary artery of native heart without angina pectoris  OSA on CPAP  Gastroesophageal reflux disease, unspecified whether esophagitis present  Fatty liver disease, nonalcoholic  Impaired fasting blood sugar -     Hemoglobin A1c  Nail deformity  Keloid scar of skin  Eczema, unspecified type  Screening for prostate cancer -     PSA  Erectile dysfunction, unspecified erectile dysfunction type  Family history of premature CAD  Combined hyperlipidemia  Bilateral high scrotal testes  Benign prostatic hyperplasia, unspecified whether lower urinary tract symptoms present  Mixed dyslipidemia  Elevated LFTs  Fatigue, unspecified type -     CBC  Low testosterone in male -     Testosterone -     PSA -     CBC  Former smoker  S/P CABG x 3  Lipoma, unspecified site    Follow-up pending labs, q84mo

## 2019-07-20 LAB — HEMOGLOBIN A1C
Est. average glucose Bld gHb Est-mCnc: 134 mg/dL
Hgb A1c MFr Bld: 6.3 % — ABNORMAL HIGH (ref 4.8–5.6)

## 2019-07-20 LAB — CBC
Hematocrit: 45.1 % (ref 37.5–51.0)
Hemoglobin: 15.1 g/dL (ref 13.0–17.7)
MCH: 28.6 pg (ref 26.6–33.0)
MCHC: 33.5 g/dL (ref 31.5–35.7)
MCV: 85 fL (ref 79–97)
Platelets: 282 10*3/uL (ref 150–450)
RBC: 5.28 x10E6/uL (ref 4.14–5.80)
RDW: 14.7 % (ref 11.6–15.4)
WBC: 5.8 10*3/uL (ref 3.4–10.8)

## 2019-07-20 LAB — TESTOSTERONE: Testosterone: 198 ng/dL — ABNORMAL LOW (ref 264–916)

## 2019-07-20 LAB — PSA: Prostate Specific Ag, Serum: 0.6 ng/mL (ref 0.0–4.0)

## 2019-07-22 ENCOUNTER — Other Ambulatory Visit: Payer: Self-pay | Admitting: Medical

## 2019-07-29 ENCOUNTER — Other Ambulatory Visit: Payer: Self-pay | Admitting: Medical

## 2019-07-29 MED ORDER — CREAM BASE EX CREA: 10.0000 "application " | TOPICAL_CREAM | Freq: Every day | CUTANEOUS | 2 refills | Status: DC

## 2019-07-30 ENCOUNTER — Other Ambulatory Visit: Payer: Self-pay | Admitting: Cardiology

## 2019-07-30 ENCOUNTER — Other Ambulatory Visit: Payer: Self-pay | Admitting: Medical

## 2019-09-02 ENCOUNTER — Other Ambulatory Visit: Payer: Self-pay | Admitting: Medical

## 2019-09-21 ENCOUNTER — Emergency Department (HOSPITAL_COMMUNITY): Payer: Self-pay

## 2019-09-21 ENCOUNTER — Other Ambulatory Visit: Payer: Self-pay

## 2019-09-21 ENCOUNTER — Emergency Department (HOSPITAL_COMMUNITY)
Admission: EM | Admit: 2019-09-21 | Discharge: 2019-09-21 | Disposition: A | Discharge status: Home / Self Care | Payer: Self-pay | Attending: Emergency Medicine | Admitting: Emergency Medicine

## 2019-09-21 DIAGNOSIS — I2510 Atherosclerotic heart disease of native coronary artery without angina pectoris: Secondary | ICD-10-CM | POA: Insufficient documentation

## 2019-09-21 DIAGNOSIS — S01111A Laceration without foreign body of right eyelid and periocular area, initial encounter: Secondary | ICD-10-CM | POA: Insufficient documentation

## 2019-09-21 DIAGNOSIS — Z87891 Personal history of nicotine dependence: Secondary | ICD-10-CM | POA: Insufficient documentation

## 2019-09-21 DIAGNOSIS — R079 Chest pain, unspecified: Secondary | ICD-10-CM | POA: Insufficient documentation

## 2019-09-21 DIAGNOSIS — Z79899 Other long term (current) drug therapy: Secondary | ICD-10-CM | POA: Insufficient documentation

## 2019-09-21 DIAGNOSIS — Y999 Unspecified external cause status: Secondary | ICD-10-CM | POA: Insufficient documentation

## 2019-09-21 DIAGNOSIS — Y939 Activity, unspecified: Secondary | ICD-10-CM | POA: Insufficient documentation

## 2019-09-21 DIAGNOSIS — I10 Essential (primary) hypertension: Secondary | ICD-10-CM | POA: Insufficient documentation

## 2019-09-21 DIAGNOSIS — Y9289 Other specified places as the place of occurrence of the external cause: Secondary | ICD-10-CM | POA: Insufficient documentation

## 2019-09-21 DIAGNOSIS — Z9861 Coronary angioplasty status: Secondary | ICD-10-CM | POA: Insufficient documentation

## 2019-09-21 DIAGNOSIS — S0181XA Laceration without foreign body of other part of head, initial encounter: Secondary | ICD-10-CM

## 2019-09-21 LAB — BASIC METABOLIC PANEL
Anion gap: 11 (ref 5–15)
BUN: 14 mg/dL (ref 6–20)
CO2: 25 mmol/L (ref 22–32)
Calcium: 9.1 mg/dL (ref 8.9–10.3)
Chloride: 104 mmol/L (ref 98–111)
Creatinine, Ser: 0.9 mg/dL (ref 0.61–1.24)
GFR calc Af Amer: >60 mL/min (ref >60)
GFR calc non Af Amer: >60 mL/min (ref >60)
Glucose, Bld: 100 mg/dL — ABNORMAL HIGH (ref 70–99)
Potassium: 3.8 mmol/L (ref 3.5–5.1)
Sodium: 140 mmol/L (ref 135–145)

## 2019-09-21 LAB — CBC WITH DIFFERENTIAL/PLATELET
Abs Immature Granulocytes: 0.05 10*3/uL (ref 0.00–0.07)
Basophils Absolute: 0.1 10*3/uL (ref 0.0–0.1)
Basophils Relative: 1 %
Eosinophils Absolute: 0.1 10*3/uL (ref 0.0–0.5)
Eosinophils Relative: 1 %
HCT: 49.8 % (ref 39.0–52.0)
Hemoglobin: 16 g/dL (ref 13.0–17.0)
Immature Granulocytes: 1 %
Lymphocytes Relative: 18 %
Lymphs Abs: 1.8 10*3/uL (ref 0.7–4.0)
MCH: 26.9 pg (ref 26.0–34.0)
MCHC: 32.1 g/dL (ref 30.0–36.0)
MCV: 83.8 fL (ref 80.0–100.0)
Monocytes Absolute: 1.1 10*3/uL — ABNORMAL HIGH (ref 0.1–1.0)
Monocytes Relative: 11 %
Neutro Abs: 7 10*3/uL (ref 1.7–7.7)
Neutrophils Relative %: 68 %
Platelets: 325 10*3/uL (ref 150–400)
RBC: 5.94 MIL/uL — ABNORMAL HIGH (ref 4.22–5.81)
RDW: 14.8 % (ref 11.5–15.5)
WBC: 10.1 10*3/uL (ref 4.0–10.5)
nRBC: 0 % (ref 0.0–0.2)

## 2019-09-21 LAB — TROPONIN I (HIGH SENSITIVITY)
Troponin I (High Sensitivity): 40 ng/L — ABNORMAL HIGH (ref <18)
Troponin I (High Sensitivity): 42 ng/L — ABNORMAL HIGH (ref <18)

## 2019-09-21 MED ORDER — LIDOCAINE-EPINEPHRINE (PF) 2 %-1:200000 IJ SOLN
20.0000 mL | Freq: Once | INTRAMUSCULAR | Status: AC
  Administered 2019-09-21: 20 mL
  Filled 2019-09-21: qty 20

## 2019-09-21 MED ORDER — OXYCODONE-ACETAMINOPHEN 5-325 MG PO TABS
1.0000 | ORAL_TABLET | Freq: Once | ORAL | Status: AC
  Administered 2019-09-21: 1 via ORAL
  Filled 2019-09-21: qty 1

## 2019-09-21 MED ORDER — NAPROXEN 500 MG PO TABS
500.0000 mg | ORAL_TABLET | Freq: Once | ORAL | Status: AC
  Administered 2019-09-21: 500 mg via ORAL
  Filled 2019-09-21: qty 1

## 2019-09-21 MED ORDER — TRAMADOL HCL 50 MG PO TABS: 50.0000 mg | ORAL_TABLET | Freq: Four times a day (QID) | ORAL | 0 refills | Status: AC | PRN
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
## 2019-09-21 NOTE — Discharge Instructions (Signed)
Regarding your chest pain, please follow-up with your primary doctor regarding the episode that you experience today.  If you have any recurrent chest pain or difficulty in breathing, please return to ER for reassessment.  Regarding your facial laceration, keep area clean and dry.  Can run gentle water but no vigorous scrubbing.  You will need sutures removed in 5 to 7 days.  This can be performed at your primary doctor's office.
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
## 2019-09-30 ENCOUNTER — Other Ambulatory Visit: Payer: Self-pay | Admitting: Medical

## 2019-10-18 ENCOUNTER — Ambulatory Visit (INDEPENDENT_AMBULATORY_CARE_PROVIDER_SITE_OTHER): Payer: Self-pay | Admitting: Cardiology

## 2019-10-18 ENCOUNTER — Encounter: Payer: Self-pay | Admitting: Cardiology

## 2019-10-18 ENCOUNTER — Other Ambulatory Visit: Payer: Self-pay

## 2019-10-18 VITALS — BP 128/70 | HR 76 | Ht 70.0 in | Wt 218.0 lb

## 2019-10-18 DIAGNOSIS — I10 Essential (primary) hypertension: Secondary | ICD-10-CM

## 2019-10-18 DIAGNOSIS — E782 Mixed hyperlipidemia: Secondary | ICD-10-CM

## 2019-10-18 DIAGNOSIS — I2510 Atherosclerotic heart disease of native coronary artery without angina pectoris: Secondary | ICD-10-CM

## 2019-10-18 NOTE — Patient Instructions (Signed)

## 2019-10-18 NOTE — Progress Notes (Signed)
Cardiology Office Note:    Date:  10/18/2019   ID:  Donald Oliver, DOB 01/23/1971, MRN [id number]  PCP:  Carlena Hurl, PA-C  Cardiologist:  No primary care provider on file.  Electrophysiologist:  None   Referring MD: Carlena Hurl, PA-C   "I am having some chest pain"  History of Present Illness:    LASHAWN Oliver is a 49 y.o. male with a hx of coronary artery disease status post bypass back in 2017 with LIMA to LAD, free RIMA to diagonal and SVG to RCA, hypertension, hyperlipidemia presents today for follow-up visit.  Patient usually follows with Dr. Geraldo Pitter.  Today he tells me that about 3 weeks ago he was in emergency department at which time he had an elevated troponin.  But he tells me he has been experiencing intermittent chest pain which he is very low-key about and notes that it is left-sided dull sensation of sometimes achy.  Is not associated with shortness of breath    Past Medical History:  Diagnosis Date  . CAD (coronary artery disease), native coronary artery 06/01/2015   Cath 3/3 80% LAD, 99% diag, 50% circ, RCA 100% with Lto R collaterals  CABG 06/02/15 Dr. Cyndia Bent with LIMA to LAD, free RIMA to Diag and SVG to RCA    . Combined hyperlipidemia   . Eczema 09/05/2018  . Elevated LFTs 05/24/2018  . Encounter for health maintenance examination in adult 02/25/2015  . Erectile dysfunction   . Essential hypertension   . Family history of premature CAD   . Fatigue 05/24/2018  . Fatty liver disease, nonalcoholic 0/12/9321  . FH: premature coronary heart disease    Father had MI at age 24   . Former smoker 08/29/2016  . Gastroesophageal reflux disease 02/25/2015  . GERD (gastroesophageal reflux disease)   . History of substance abuse ([place])    Pt had abused amphetamines : quit in 2005  . Hypertension 2005  . Impaired fasting blood sugar 2016  . Keloid scar of skin 06/21/2016  . Lipoma 07/19/2019  . Low testosterone in male 05/24/2018  . Metatarsalgia of right foot  06/21/2016  . Mixed dyslipidemia 09/05/2018  . Nail deformity 07/20/2015  . Need for prophylactic vaccination against Streptococcus pneumoniae (pneumococcus) 12/17/2015  . Need for prophylactic vaccination and inoculation against influenza 12/17/2015  . Onychomycosis   . OSA on CPAP 10/31/2016  . 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 CARDIAC CATHETERIZATION N/A 05/29/2015   Procedure: Left Heart Cath and Coronary Angiography;  Surgeon: Jettie Booze, MD;  Location: [place] [facility];  Service: Cardiovascular;  Laterality: N/A;  . CARDIAC CATHETERIZATION  05/29/2015   Procedure: Coronary Balloon Angioplasty;  Surgeon: Jettie Booze, MD;  Location: [place] [facility];  Service: Cardiovascular;;  . CORONARY ARTERY BYPASS GRAFT N/A 06/02/2015   Procedure: CORONARY ARTERY BYPASS GRAFTING (CABG) x  3 utilizing bilateral internal mammary artery and endoscopically harvested right sapheneous vein.;  Surgeon: [name], MD;  Location: [facility];  Service: Open Heart Surgery;  Laterality: N/A;  . RHINOPLASTY  [id number]   w/repair of nasal fractuare; done at [facility]   . TEE WITHOUT CARDIOVERSION N/A 06/02/2015   Procedure: TRANSESOPHAGEAL ECHOCARDIOGRAM (TEE);  Surgeon: [name], MD;  Location: [facility];  Service: Open Heart Surgery;  Laterality: N/A;    Current Medications: Current Meds  Medication Sig  . amLODipine (NORVASC) 10 MG tablet TAKE  ONE TABLET BY MOUTH DAILY  . aspirin 81 MG tablet Take 81 mg by mouth daily.  Marland Kitchen buPROPion (WELLBUTRIN XL) 300 MG 24 hr tablet TAKE ONE TABLET BY MOUTH DAILY  . Cream Base CREA Apply 10 application topically daily.  Marland Kitchen lisinopril-hydrochlorothiazide (ZESTORETIC) 20-25 MG tablet TAKE ONE TABLET BY MOUTH DAILY  . metoprolol succinate (TOPROL-XL) 25 MG 24 hr tablet Take 1 tablet (25 mg total) by mouth daily.  . nitroGLYCERIN (NITROSTAT) 0.4 MG SL tablet Place 1 tablet (0.4 mg total) under the tongue every 5 (five)  minutes as needed for chest pain.  . pantoprazole (PROTONIX) 40 MG tablet Take 1 tablet (40 mg total) by mouth daily.  . rosuvastatin (CRESTOR) 20 MG tablet TAKE ONE TABLET BY MOUTH DAILY  . tadalafil (CIALIS) 5 MG tablet TAKE ONE TABLET BY MOUTH DAILY AS NEEDED FOR ERECTILE DYSFUNCTION  . triamcinolone cream (KENALOG) 0.1 % Apply 1 application topically 2 (two) times daily.  . [DISCONTINUED] citalopram (CELEXA) 20 MG tablet Take 1 tablet (20 mg total) by mouth daily.     Allergies:   Amoxicillin and Fluoxetine   Social History   Socioeconomic History  . Marital status: Legally Separated    Spouse name: Not on file  . Number of children: 3  . Years of education: 27   . Highest education level: Not on file  Occupational History  . Occupation: Sports coach    Comment: unemployed   Tobacco Use  . Smoking status: Former Smoker    Packs/day: 1.00    Years: 30.00    Pack years: 30.00    Types: Cigarettes  . Smokeless tobacco: Never Used  Vaping Use  . Vaping Use: Some days  . Substances: Nicotine  Substance and Sexual Activity  . Alcohol use: Yes    Alcohol/week: 1.0 standard drink    Types: 1 Standard drinks or equivalent per week    Comment: rarely  . Drug use: Not Currently    Frequency: 1.0 times per week    Types: Marijuana    Comment: 05/29/2015 "none in the last 10 years"  . Sexual activity: Yes  Other Topics Concern  . 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   06/2019   Social Determinants of Health   Financial Resource Strain:   . Difficulty of Paying Living Expenses:   Food Insecurity:   . Worried About Charity fundraiser in the Last Year:   . Arboriculturist in the Last Year:   Transportation Needs:   . Film/video editor (Medical):   Marland Kitchen Lack of Transportation (Non-Medical):   Physical  Activity:   . Days of Exercise per Week:   . Minutes of Exercise per Session:   Stress:   . Feeling of Stress :   Social Connections:   . Frequency of Communication with Friends and Family:   . Frequency of Social Gatherings with Friends and Family:   . Attends Religious Services:   . Active Member of Clubs or Organizations:   . Attends Archivist Meetings:   Marland Kitchen Marital Status:      Family History: The patient's family history includes Cancer in his maternal aunt; Diabetes in his father and mother; HIV in his sister; Heart disease in his mother; Heart disease (age of onset: 63) in his father;  Heart failure in his father. There is no history of Stroke.  ROS:   Review of Systems  Constitution: Negative for decreased appetite, fever and weight gain.  HENT: Negative for congestion, ear discharge, hoarse voice and sore throat.   Eyes: Negative for discharge, redness, vision loss in right eye and visual halos.  Cardiovascular: Negative for chest pain, dyspnea on exertion, leg swelling, orthopnea and palpitations.  Respiratory: Negative for cough, hemoptysis, shortness of breath and snoring.   Endocrine: Negative for heat intolerance and polyphagia.  Hematologic/Lymphatic: Negative for bleeding problem. Does not bruise/bleed easily.  Skin: Negative for flushing, nail changes, rash and suspicious lesions.  Musculoskeletal: Negative for arthritis, joint pain, muscle cramps, myalgias, neck pain and stiffness.  Gastrointestinal: Negative for abdominal pain, bowel incontinence, diarrhea and excessive appetite.  Genitourinary: Negative for decreased libido, genital sores and incomplete emptying.  Neurological: Negative for brief paralysis, focal weakness, headaches and loss of balance.  Psychiatric/Behavioral: Negative for altered mental status, depression and suicidal ideas.  Allergic/Immunologic: Negative for HIV exposure and persistent infections.    EKGs/Labs/Other Studies  Reviewed:    The following studies were reviewed today:   EKG: None today  Recent Labs: 04/11/2019: ALT 49 09/21/2019: BUN 14; Creatinine, Ser 0.90; Hemoglobin 16.0; Platelets 325; Potassium 3.8; Sodium 140  Recent Lipid Panel    Component Value Date/Time   CHOL 157 04/11/2019 1102   TRIG 158 (H) 04/11/2019 1102   HDL 49 04/11/2019 1102   CHOLHDL 3.2 04/11/2019 1102   CHOLHDL 5.6 (H) 10/31/2016 1500   VLDL 74 (H) 10/31/2016 1500   LDLCALC 81 04/11/2019 1102    Physical Exam:    VS:  BP 128/70   Pulse 76   Ht 5\' 10"  (1.778 m)   Wt (!) 218 lb (98.9 kg)   SpO2 98%   BMI 31.28 kg/m     Wt Readings from Last 3 Encounters:  10/18/19 (!) 218 lb (98.9 kg)  09/21/19 215 lb (97.5 kg)  07/19/19 214 lb 3.2 oz (97.2 kg)     GEN: Well nourished, well developed in no acute distress HEENT: Normal NECK: No JVD; No carotid bruits LYMPHATICS: No lymphadenopathy CARDIAC: S1S2 noted,RRR, no murmurs, rubs, gallops RESPIRATORY:  Clear to auscultation without rales, wheezing or rhonchi  ABDOMEN: Soft, non-tender, non-distended, +bowel sounds, no guarding. EXTREMITIES: No edema, No cyanosis, no clubbing MUSCULOSKELETAL:  No deformity  SKIN: Warm and dry NEUROLOGIC:  Alert and oriented x 3, non-focal PSYCHIATRIC:  Normal affect, good insight  ASSESSMENT:    1. Coronary artery disease involving native heart, angina presence unspecified, unspecified vessel or lesion type   2. Essential hypertension   3. Mixed hyperlipidemia    PLAN:      He is having what I suspect to be angina pain.  With his history of coronary artery disease and significant high risk factors I like to start by adding antianginal Imdur to the patient medication regimen.  And he actually with his increase troponin I would like the patient get a left heart catheterization but at this time he has declined and said he would like to speak to the other doctors.  I tried to explain to the patient why is important for him  to get further testing although his stress test was normal in 2020, his new symptoms and concern with elevated troponin warrant testing.  But he has declined.  He wishes not to speak with me further.  The patient is in agreement with the above plan. The patient  left the office in stable condition.  The patient will follow up with Dr. Geraldo Pitter who is the patient's primary doctor.   Medication Adjustments/Labs and Tests Ordered: Current medicines are reviewed at length with the patient today.  Concerns regarding medicines are outlined above.  No orders of the defined types were placed in this encounter.  No orders of the defined types were placed in this encounter.   Patient Instructions  Medication Instructions:  Your physician recommends that you continue on your current medications as directed. Please refer to the Current Medication list given to you today.  *If you need a refill on your cardiac medications before your next appointment, please call your pharmacy*   Lab Work: None If you have labs (blood work) drawn today and your tests are completely normal, you will receive your results only by: Marland Kitchen MyChart Message (if you have MyChart) OR . A paper copy in the mail If you have any lab test that is abnormal or we need to change your treatment, we will call you to review the results.   Testing/Procedures: None     Follow-Up: At [facility], you and your health needs are our priority.  As part of our continuing mission to provide you with exceptional heart care, we have created designated Provider Care Teams.  These Care Teams include your primary Cardiologist (physician) and Advanced Practice Providers (APPs -  Physician Assistants and Nurse Practitioners) who all work together to provide you with the care you need, when you need it.  We recommend signing up for the patient portal called "MyChart".  Sign up information is provided on this After Visit Summary.  MyChart is used to  connect with patients for Virtual Visits (Telemedicine).  Patients are able to view lab/test results, encounter notes, upcoming appointments, etc.  Non-urgent messages can be sent to your provider as well.   To learn more about what you can do with MyChart, go to [email or web address].    Your next appointment:   3 month(s)  The format for your next appointment:   In Person  Provider:   Jyl Heinz, MD   Other Instructions      Adopting a Healthy Lifestyle.  Know what a healthy weight is for you (roughly BMI <25) and aim to maintain this   Aim for 7+ servings of fruits and vegetables daily   65-80+ fluid ounces of water or unsweet tea for healthy kidneys   Limit to max 1 drink of alcohol per day; avoid smoking/tobacco   Limit animal fats in diet for cholesterol and heart health - choose grass fed whenever available   Avoid highly processed foods, and foods high in saturated/trans fats   Aim for low stress - take time to unwind and care for your mental health   Aim for 150 min of moderate intensity exercise weekly for heart health, and weights twice weekly for bone health   Aim for 7-9 hours of sleep daily   When it comes to diets, agreement about the perfect plan isnt easy to find, even among the experts. Experts at the [place] developed an idea known as the Healthy Eating Plate. Just imagine a plate divided into logical, healthy portions.   The emphasis is on diet quality:   Load up on vegetables and fruits - one-half of your plate: Aim for color and variety, and remember that potatoes dont count.   Go for whole grains - one-quarter of your plate: Whole wheat, barley, wheat  berries, quinoa, oats, brown rice, and foods made with them. If you want pasta, go with whole wheat pasta.   Protein power - one-quarter of your plate: Fish, chicken, beans, and nuts are all healthy, versatile protein sources. Limit red meat.   The diet, however,  does go beyond the plate, offering a few other suggestions.   Use healthy plant oils, such as olive, canola, soy, corn, sunflower and peanut. Check the labels, and avoid partially hydrogenated oil, which have unhealthy trans fats.   If youre thirsty, drink water. Coffee and tea are good in moderation, but skip sugary drinks and limit milk and dairy products to one or two daily servings.   The type of carbohydrate in the diet is more important than the amount. Some sources of carbohydrates, such as vegetables, fruits, whole grains, and beans-are healthier than others.   Finally, stay active  Signed, Berniece Salines, DO  10/18/2019 10:14 AM    [place]

## 2019-10-30 ENCOUNTER — Other Ambulatory Visit: Payer: Self-pay | Admitting: Medical

## 2020-01-14 ENCOUNTER — Other Ambulatory Visit: Payer: Self-pay | Admitting: Medical

## 2020-01-16 NOTE — Telephone Encounter (Signed)
[facility] is requesting to fill pt tadalafil Please advise [facility]

## 2020-01-30 ENCOUNTER — Other Ambulatory Visit: Payer: Self-pay

## 2020-01-31 ENCOUNTER — Other Ambulatory Visit: Payer: Self-pay

## 2020-01-31 ENCOUNTER — Ambulatory Visit (INDEPENDENT_AMBULATORY_CARE_PROVIDER_SITE_OTHER): Payer: Self-pay | Admitting: Cardiology

## 2020-01-31 ENCOUNTER — Encounter: Payer: Self-pay | Admitting: Cardiology

## 2020-01-31 VITALS — BP 140/82 | HR 70 | Ht 70.0 in | Wt 208.0 lb

## 2020-01-31 DIAGNOSIS — I25110 Atherosclerotic heart disease of native coronary artery with unstable angina pectoris: Secondary | ICD-10-CM

## 2020-01-31 NOTE — Progress Notes (Signed)
Cardiology Office Note:    Date:  01/31/2020   ID:  Donald Oliver, DOB 1970-04-30, MRN [id number]  PCP:  Carlena Hurl, PA-C  Cardiologist:  No primary care provider on file.  Electrophysiologist:  None   Referring MD: Carlena Hurl, PA-C   " I need to get my form completed for clearance.  History of Present Illness:    Donald Oliver is a 49 y.o. male with a  hx of coronary artery disease status post bypass back in 2017 with LIMA to LAD, free RIMA to diagonal and SVG to RCA, hypertension, hyperlipidemia presents today for follow-up visit.  I did see the patient for the first time in July 2021 at that time he presented due to chest pain.  He had had a heart hospitalization where he had elevated troponin and was experiencing intermittent chest pain.  Given his risk factor at that visit I recommend the patient undergo a left heart catheterization but he declined noting that he wanted to get a second opinion from other doctors. I started the patient on Imdur at that visit.  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  He tells me that he does have some midsternal diffuse squeezing the and aching sensation which sometimes is at the left side but no pressure.  I do suspect that the patient is not giving meaningful information as he keeps telling me during our visit when asked about the chest pain that he does not think he needs any further testing and he wants to go to work.  Past Medical History:  Diagnosis Date  . CAD (coronary artery disease), native coronary artery 06/01/2015   Cath 3/3 80% LAD, 99% diag, 50% circ, RCA 100% with Lto R collaterals  CABG 06/02/15 Dr. Cyndia Bent with LIMA to LAD, free RIMA to Diag and SVG to RCA    . Combined hyperlipidemia   . Eczema 09/05/2018  . Elevated LFTs 05/24/2018  . Encounter for health maintenance examination in adult 02/25/2015  . Erectile  dysfunction   . Essential hypertension   . Family history of premature CAD   . Fatigue 05/24/2018  . Fatty liver disease, nonalcoholic 10/14/9468  . FH: premature coronary heart disease    Father had MI at age 61   . Former smoker 08/29/2016  . Gastroesophageal reflux disease 02/25/2015  . GERD (gastroesophageal reflux disease)   . History of substance abuse ([place])    Pt had abused amphetamines : quit in 2005  . Hypertension 2005  . Impaired fasting blood sugar 2016  . Keloid scar of skin 06/21/2016  . Lipoma 07/19/2019  . Low testosterone in male 05/24/2018  . Metatarsalgia of right foot 06/21/2016  . Mixed dyslipidemia 09/05/2018  . Nail deformity 07/20/2015  . Need for prophylactic vaccination against Streptococcus pneumoniae (pneumococcus) 12/17/2015  . Need for prophylactic vaccination and inoculation against influenza 12/17/2015  . Onychomycosis   . OSA on CPAP 10/31/2016  . 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 CARDIAC CATHETERIZATION N/A 05/29/2015   Procedure: Left Heart Cath and Coronary Angiography;  Surgeon: Jettie Booze, MD;  Location: [place] [facility];  Service: Cardiovascular;  Laterality: N/A;  . CARDIAC CATHETERIZATION  05/29/2015   Procedure: Coronary Balloon Angioplasty;  Surgeon: Jettie Booze, MD;  Location: [place] [facility];  Service:  Cardiovascular;;  . CORONARY ARTERY BYPASS GRAFT N/A 06/02/2015   Procedure: CORONARY ARTERY BYPASS GRAFTING (CABG) x  3 utilizing bilateral internal mammary artery and endoscopically harvested right sapheneous vein.;  Surgeon: [name], MD;  Location: [place];  Service: Open Heart Surgery;  Laterality: N/A;  . RHINOPLASTY  [id number]   w/repair of nasal fractuare; done at [facility]   . TEE WITHOUT CARDIOVERSION N/A 06/02/2015   Procedure: TRANSESOPHAGEAL ECHOCARDIOGRAM (TEE);  Surgeon: [name], MD;  Location: [facility];  Service: Open Heart Surgery;  Laterality: N/A;    Current  Medications: Current Meds  Medication Sig  . amLODipine (NORVASC) 10 MG tablet TAKE ONE TABLET BY MOUTH DAILY  . aspirin 81 MG tablet Take 81 mg by mouth daily.  . Aspirin Buf,CaCarb-MgCarb-MgO, 81 MG TABS Take 81 mg by mouth daily.  Marland Kitchen buPROPion (WELLBUTRIN XL) 300 MG 24 hr tablet TAKE ONE TABLET BY MOUTH DAILY  . Cream Base CREA Apply 70ml as directed and rub in daily.  Marland Kitchen lisinopril-hydrochlorothiazide (ZESTORETIC) 20-25 MG tablet TAKE ONE TABLET BY MOUTH DAILY  . metoprolol succinate (TOPROL-XL) 25 MG 24 hr tablet Take 1 tablet (25 mg total) by mouth daily.  . nitroGLYCERIN (NITROSTAT) 0.4 MG SL tablet Place 1 tablet (0.4 mg total) under the tongue every 5 (five) minutes as needed for chest pain.  . pantoprazole (PROTONIX) 40 MG tablet TAKE ONE TABLET BY MOUTH DAILY  . rosuvastatin (CRESTOR) 20 MG tablet TAKE ONE TABLET BY MOUTH DAILY  . tadalafil (CIALIS) 5 MG tablet TAKE ONE TABLET BY MOUTH DAILY AS NEEDED FOR ERECTILE DYSFUNCTION  . triamcinolone cream (KENALOG) 0.1 % Apply 1 application topically 2 (two) times daily.     Allergies:   Amoxicillin and Fluoxetine   Social History   Socioeconomic History  . Marital status: Legally Separated    Spouse name: Not on file  . Number of children: 3  . Years of education: 60   . Highest education level: Not on file  Occupational History  . Occupation: Sports coach    Comment: unemployed   Tobacco Use  . Smoking status: Former Smoker    Packs/day: 1.00    Years: 30.00    Pack years: 30.00    Types: Cigarettes  . Smokeless tobacco: Never Used  Vaping Use  . Vaping Use: Some days  . Substances: Nicotine  Substance and Sexual Activity  . Alcohol use: Yes    Alcohol/week: 1.0 standard drink    Types: 1 Standard drinks or equivalent per week    Comment: rarely  . Drug use: Not Currently    Frequency: 1.0 times per week    Types: Marijuana    Comment: 05/29/2015 "none in the last 10 years"  . Sexual activity: Yes    Other Topics Concern  . 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   06/2019   Social Determinants of Health   Financial Resource Strain:   . Difficulty of Paying Living Expenses: Not on file  Food Insecurity:   . Worried About Charity fundraiser in the Last Year: Not on file  . Ran Out of Food in the Last Year: Not on file  Transportation Needs:   . Lack of Transportation (Medical): Not on file  . Lack of Transportation (Non-Medical): Not on file  Physical  Activity:   . Days of Exercise per Week: Not on file  . Minutes of Exercise per Session: Not on file  Stress:   . Feeling of Stress : Not on file  Social Connections:   . Frequency of Communication with Friends and Family: Not on file  . Frequency of Social Gatherings with Friends and Family: Not on file  . Attends Religious Services: Not on file  . Active Member of Clubs or Organizations: Not on file  . Attends Archivist Meetings: Not on file  . Marital Status: Not on file     Family History: The patient's family history includes Cancer in his maternal aunt; Diabetes in his father and mother; HIV in his sister; Heart disease in his mother; Heart disease (age of onset: 49) in his father; Heart failure in his father. There is no history of Stroke.  ROS:   Review of Systems  Constitution: Negative for decreased appetite, fever and weight gain.  HENT: Negative for congestion, ear discharge, hoarse voice and sore throat.   Eyes: Negative for discharge, redness, vision loss in right eye and visual halos.  Cardiovascular: Negative for chest pain, dyspnea on exertion, leg swelling, orthopnea and palpitations.  Respiratory: Negative for cough, hemoptysis, shortness of breath and snoring.   Endocrine: Negative for heat intolerance and polyphagia.   Hematologic/Lymphatic: Negative for bleeding problem. Does not bruise/bleed easily.  Skin: Negative for flushing, nail changes, rash and suspicious lesions.  Musculoskeletal: Negative for arthritis, joint pain, muscle cramps, myalgias, neck pain and stiffness.  Gastrointestinal: Negative for abdominal pain, bowel incontinence, diarrhea and excessive appetite.  Genitourinary: Negative for decreased libido, genital sores and incomplete emptying.  Neurological: Negative for brief paralysis, focal weakness, headaches and loss of balance.  Psychiatric/Behavioral: Negative for altered mental status, depression and suicidal ideas.  Allergic/Immunologic: Negative for HIV exposure and persistent infections.    EKGs/Labs/Other Studies Reviewed:    The following studies were reviewed today:   EKG: None today  Recent Labs: 04/11/2019: ALT 49 09/21/2019: BUN 14; Creatinine, Ser 0.90; Hemoglobin 16.0; Platelets 325; Potassium 3.8; Sodium 140  Recent Lipid Panel    Component Value Date/Time   CHOL 157 04/11/2019 1102   TRIG 158 (H) 04/11/2019 1102   HDL 49 04/11/2019 1102   CHOLHDL 3.2 04/11/2019 1102   CHOLHDL 5.6 (H) 10/31/2016 1500   VLDL 74 (H) 10/31/2016 1500   LDLCALC 81 04/11/2019 1102    Physical Exam:    VS:  BP 140/82   Pulse 70   Ht 5\' 10"  (1.778 m)   Wt 208 lb (94.3 kg)   SpO2 97%   BMI 29.84 kg/m     Wt Readings from Last 3 Encounters:  01/31/20 208 lb (94.3 kg)  10/18/19 (!) 218 lb (98.9 kg)  09/21/19 215 lb (97.5 kg)     GEN: Well nourished, well developed in no acute distress HEENT: Normal NECK: No JVD; No carotid bruits LYMPHATICS: No lymphadenopathy CARDIAC: S1S2 noted,RRR, no murmurs, rubs, gallops RESPIRATORY:  Clear to auscultation without rales, wheezing or rhonchi  ABDOMEN: Soft, non-tender, non-distended, +bowel sounds, no guarding. EXTREMITIES: No edema, No cyanosis, no clubbing MUSCULOSKELETAL:  No deformity  SKIN: Warm and dry NEUROLOGIC:  Alert  and oriented x 3, non-focal PSYCHIATRIC:  Normal affect, good insight  ASSESSMENT:    1. Coronary artery disease involving native coronary artery of native heart with unstable angina pectoris ([place])    PLAN:     I am unable to fully assess the  patient as he keep insisting that he does just want to go to work and does not think he needs any further testing.  Clinically with his dull aching pain despite addition of Imdur which in the setting of his coronary artery disease and not too long ago with elevated troponin he is a high risk patient for progression of coronary artery disease.  Ideally we will to rule out any progression of his coronary artery disease as he is high risk but at this time he still declines a left heart catheterization.  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  We are going to set the patient up for next week to see one of the other providers.  During her visit he also tells me that he does not dispute that he may need a heart catheterization but he really does not want to have a procedure.  For this patient who no longer had elevated troponin and symptoms I do not believe that function test as a nuclear will be the right testing here given the fact that if this test is normal it does not rule out progression of coronary artery disease.  The patient is in agreement with the above plan. The patient left the office in stable condition.    Medication Adjustments/Labs and Tests Ordered: Current medicines are reviewed at length with the patient today.  Concerns regarding medicines are outlined above.  No orders of the defined types were placed in this encounter.  No orders of the defined types were placed in this encounter.   Patient Instructions  Medication Instructions:  *If you need a refill on your cardiac medications before your next appointment, please call your  pharmacy*  Follow-Up: At [facility], you and your health needs are our priority.  As part of our continuing mission to provide you with exceptional heart care, we have created designated Provider Care Teams.  These Care Teams include your primary Cardiologist (physician) and Advanced Practice Providers (APPs -  Physician Assistants and Nurse Practitioners) who all work together to provide you with the care you need, when you need it.  We recommend signing up for the patient portal called "MyChart".  Sign up information is provided on this After Visit Summary.  MyChart is used to connect with patients for Virtual Visits (Telemedicine).  Patients are able to view lab/test results, encounter notes, upcoming appointments, etc.  Non-urgent messages can be sent to your provider as well.   To learn more about what you can do with MyChart, go to [email or web address].    Your next appointment:   Your physician recommends that you keep your scheduled follow-up appointment on Tuesday 02/04/20 at 8:20 am  The format for your next appointment:   In Person with Jyl Heinz, MD        Adopting a Healthy Lifestyle.  Know what a healthy weight is for you (roughly BMI <25) and aim to maintain this   Aim for 7+ servings of fruits and vegetables daily   65-80+ fluid ounces of water or unsweet tea for healthy kidneys   Limit to max 1 drink of alcohol per day; avoid smoking/tobacco   Limit animal fats in diet for cholesterol and heart health - choose grass fed whenever available   Avoid highly processed foods, and foods high in saturated/trans fats   Aim for low stress - take time to unwind and care for your mental  health   Aim for 150 min of moderate intensity exercise weekly for heart health, and weights twice weekly for bone health   Aim for 7-9 hours of sleep daily   When it comes to diets, agreement about the perfect plan isnt easy to find, even among the experts. Experts at the  [employer] developed an idea known as the Healthy Eating Plate. Just imagine a plate divided into logical, healthy portions.   The emphasis is on diet quality:   Load up on vegetables and fruits - one-half of your plate: Aim for color and variety, and remember that potatoes dont count.   Go for whole grains - one-quarter of your plate: Whole wheat, barley, wheat berries, quinoa, oats, brown rice, and foods made with them. If you want pasta, go with whole wheat pasta.   Protein power - one-quarter of your plate: Fish, chicken, beans, and nuts are all healthy, versatile protein sources. Limit red meat.   The diet, however, does go beyond the plate, offering a few other suggestions.   Use healthy plant oils, such as olive, canola, soy, corn, sunflower and peanut. Check the labels, and avoid partially hydrogenated oil, which have unhealthy trans fats.   If youre thirsty, drink water. Coffee and tea are good in moderation, but skip sugary drinks and limit milk and dairy products to one or two daily servings.   The type of carbohydrate in the diet is more important than the amount. Some sources of carbohydrates, such as vegetables, fruits, whole grains, and beans-are healthier than others.   Finally, stay active  Signed, Berniece Salines, DO  01/31/2020 12:06 PM    Council

## 2020-01-31 NOTE — Patient Instructions (Signed)
Medication Instructions:  *If you need a refill on your cardiac medications before your next appointment, please call your pharmacy*  Follow-Up: At [facility], you and your health needs are our priority.  As part of our continuing mission to provide you with exceptional heart care, we have created designated Provider Care Teams.  These Care Teams include your primary Cardiologist (physician) and Advanced Practice Providers (APPs -  Physician Assistants and Nurse Practitioners) who all work together to provide you with the care you need, when you need it.  We recommend signing up for the patient portal called "MyChart".  Sign up information is provided on this After Visit Summary.  MyChart is used to connect with patients for Virtual Visits (Telemedicine).  Patients are able to view lab/test results, encounter notes, upcoming appointments, etc.  Non-urgent messages can be sent to your provider as well.   To learn more about what you can do with MyChart, go to [email or web address].    Your next appointment:   Your physician recommends that you keep your scheduled follow-up appointment on Tuesday 02/04/20 at 8:20 am  The format for your next appointment:   In Person with Jyl Heinz, MD

## 2020-02-03 DIAGNOSIS — Z8249 Family history of ischemic heart disease and other diseases of the circulatory system: Secondary | ICD-10-CM | POA: Insufficient documentation

## 2020-02-03 DIAGNOSIS — F1911 Other psychoactive substance abuse, in remission: Secondary | ICD-10-CM | POA: Insufficient documentation

## 2020-02-03 DIAGNOSIS — K219 Gastro-esophageal reflux disease without esophagitis: Secondary | ICD-10-CM | POA: Insufficient documentation

## 2020-02-04 ENCOUNTER — Ambulatory Visit (INDEPENDENT_AMBULATORY_CARE_PROVIDER_SITE_OTHER): Payer: Medicaid Other | Admitting: Cardiology

## 2020-02-04 ENCOUNTER — Other Ambulatory Visit: Payer: Self-pay

## 2020-02-04 ENCOUNTER — Encounter: Payer: Self-pay | Admitting: Cardiology

## 2020-02-04 VITALS — BP 138/80 | HR 85 | Ht 70.0 in | Wt 209.0 lb

## 2020-02-04 DIAGNOSIS — Z951 Presence of aortocoronary bypass graft: Secondary | ICD-10-CM

## 2020-02-04 DIAGNOSIS — E782 Mixed hyperlipidemia: Secondary | ICD-10-CM

## 2020-02-04 DIAGNOSIS — I2510 Atherosclerotic heart disease of native coronary artery without angina pectoris: Secondary | ICD-10-CM

## 2020-02-04 DIAGNOSIS — I10 Essential (primary) hypertension: Secondary | ICD-10-CM

## 2020-02-04 NOTE — Patient Instructions (Signed)
Medication Instructions:  No medication changes. *If you need a refill on your cardiac medications before your next appointment, please call your pharmacy*   Lab Work: None ordered If you have labs (blood work) drawn today and your tests are completely normal, you will receive your results only by:  [place] (if you have MyChart) OR  A paper copy in the mail If you have any lab test that is abnormal or we need to change your treatment, we will call you to review the results.   Testing/Procedures: Your physician has requested that you have a lexiscan myoview. For further information please visit [email or web address]. Please follow instruction sheet, as given.  The test will take approximately 3 to 4 hours to complete; you may bring reading material.  If someone comes with you to your appointment, they will need to remain in the main lobby due to limited space in the testing area. **If you are pregnant or breastfeeding, please notify the nuclear lab prior to your appointment**  How to prepare for your Myocardial Perfusion Test:  Do not eat or drink 3 hours prior to your test, except you may have water.  Do not consume products containing caffeine (regular or decaffeinated) 12 hours prior to your test. (ex: coffee, chocolate, sodas, tea). Do bring a list of your current medications with you.  If not listed below, you may take your medications as normal.  Do not take metoprolol (Lopressor, Toprol) for 24 hours prior to the test.  Bring the medication to your appointment as you may be required to take it once the test is complete.  Do wear comfortable clothes (no dresses or overalls) and walking shoes, tennis shoes preferred (No heels or open toe shoes are allowed).  Do NOT wear cologne, perfume, aftershave, or lotions (deodorant is allowed).  If these instructions are not followed, your test will have to be rescheduled.   Follow-Up: At [facility], you and your health needs  are our priority.  As part of our continuing mission to provide you with exceptional heart care, we have created designated Provider Care Teams.  These Care Teams include your primary Cardiologist (physician) and Advanced Practice Providers (APPs -  Physician Assistants and Nurse Practitioners) who all work together to provide you with the care you need, when you need it.  We recommend signing up for the patient portal called "MyChart".  Sign up information is provided on this After Visit Summary.  MyChart is used to connect with patients for Virtual Visits (Telemedicine).  Patients are able to view lab/test results, encounter notes, upcoming appointments, etc.  Non-urgent messages can be sent to your provider as well.   To learn more about what you can do with MyChart, go to [email or web address].    Your next appointment:   3 month(s)  The format for your next appointment:   In Person  Provider:   Jyl Heinz, MD   Other Instructions  Exercise Stress Echocardiogram  An exercise stress echocardiogram is a test that checks how well your heart is working. For this test, you will walk on a treadmill to make your heart beat faster. This test uses sound waves (ultrasound) and a computer to make pictures (images) of your heart. These pictures will be taken before you exercise and after you exercise. What happens before the procedure?  Follow instructions from your doctor about what you cannot eat or drink before the test.  Do not drink or eat anything that has caffeine in it. Stop having  caffeine for 24 hours before the test.  Ask your doctor about changing or stopping your normal medicines. This is important if you take diabetes medicines or blood thinners. Ask your doctor if you should take your medicines with water before the test.  If you use an inhaler, bring it to the test.  Do not use any products that have nicotine or tobacco in them, such as cigarettes and e-cigarettes. Stop  using them for 4 hours before the test. If you need help quitting, ask your doctor.  Wear comfortable shoes and clothing. What happens during the procedure?  You will be hooked up to a TV screen. Your doctor will watch the screen to see how fast your heart beats during the test.  Before you exercise, a computer will make a picture of your heart. To do this: ? A gel will be put on your chest. ? A wand will be moved over the gel. ? Sound waves from the wand will go to the computer to make the picture.  Your will start walking on a treadmill. The treadmill will start at a slow speed. It will get faster a little bit at a time. When you walk faster, your heart will beat faster.  The treadmill will be stopped when your heart is working hard.  You will lie down right away so another picture of your heart can be taken.  The test will take 30-60 minutes. What happens after the procedure?  Your heart rate and blood pressure will be watched after the test.  If your doctor says that you can, you may: ? Eat what you usually eat. ? Do your normal activities. ? Take medicines like normal. Summary  An exercise stress echocardiogram is a test that checks how well your heart is working.  Follow instructions about what you cannot eat or drink before the test. Ask your doctor if you should take your normal medicines before the test.  Stop having caffeine for 24 hours before the test. Do not use anything with nicotine or tobacco in it for 4 hours before the test.  A computer will take a picture of your heart before you walk on a treadmill. It will take another picture when you are done walking.  Your heart rate and blood pressure will be watched after the test. This information is not intended to replace advice given to you by your health care provider. Make sure you discuss any questions you have with your health care provider. Document Revised: 01/07/2016 Document Reviewed: 12/06/2015 Elsevier  Patient Education  2020 [employer].

## 2020-02-04 NOTE — Progress Notes (Signed)
Cardiology Office Note:    Date:  02/04/2020   ID:  Donald Oliver, DOB 1970/04/08, MRN [id number]  PCP:  Carlena Hurl, PA-C  Cardiologist:  Jenean Lindau, MD   Referring MD: Carlena Hurl, PA-C    ASSESSMENT:    1. Coronary artery disease involving native coronary artery of native heart without angina pectoris   2. Combined hyperlipidemia   3. Essential hypertension   4. S/P CABG x 3   5. Mixed dyslipidemia    PLAN:    In order of problems listed above:  1. Coronary artery disease: Secondary prevention stressed with the patient.  Importance of compliance with diet medication stressed any vocalized understanding.  He seems to have good effort tolerance.  He is asymptomatic.  I would like him to be assessed with a treadmill testing, nuclear imaging preferred in view of the fact that he has had bypass surgery in the past.  He is agreeable.  This is not an modality available time of course coronary angiography probably be an option.  I discussed this with him at length and he vocalized understanding and questions were answered to satisfaction. 2. Essential hypertension: Blood pressure stable and diet was emphasized.  Lifestyle modification stressed 3. Mixed dyslipidemia: Lipids were reviewed and diet was emphasized he mentions to me that he has lost about 15 pounds in the past few months.  He is working well on his diet and exercise to optimize his health. 4. Patient will be seen in follow-up appointment in 3 months or earlier if the patient has any concerns    Medication Adjustments/Labs and Tests Ordered: Current medicines are reviewed at length with the patient today.  Concerns regarding medicines are outlined above.  No orders of the defined types were placed in this encounter.  No orders of the defined types were placed in this encounter.    No chief complaint on file.    History of Present Illness:    Donald Oliver is a 49 y.o. male.  Patient has past  medical history of coronary artery disease post CABG surgery, essential hypertension and dyslipidemia.  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  He was recommended coronary angiography and wanted a second opinion.  I took a detailed history on the patient.  He tells me that he exercises on a regular basis [facility]  He tells me that he walks about 5 to 6 miles on a daily basis without any symptoms.  No chest pain orthopnea or PND.  He is sexually active and with this he has no symptoms.  At the time of my evaluation, the patient is alert awake oriented and in no distress.  Past Medical History:  Diagnosis Date  . Benign prostatic hyperplasia 09/05/2018  . Bilateral high scrotal testes 05/23/2019  . CAD (coronary artery disease), native coronary artery 06/01/2015   Cath 3/3 80% LAD, 99% diag, 50% circ, RCA 100% with Lto R collaterals  CABG 06/02/15 Dr. Cyndia Bent with LIMA to LAD, free RIMA to Diag and SVG to RCA    . Combined hyperlipidemia   . Depressed mood 07/19/2019  . Eczema 09/05/2018  . Elevated LFTs 05/24/2018  . Encounter for health maintenance examination in adult 02/25/2015  . Erectile dysfunction   . Essential hypertension   . Family history of premature CAD   . Fatigue 05/24/2018  . Fatty liver disease, nonalcoholic 5/32/9924  . FH: premature  coronary heart disease    Father had MI at age 30   . Former smoker 08/29/2016  . Gastroesophageal reflux disease 02/25/2015  . GERD (gastroesophageal reflux disease)   . History of substance abuse ([place])    Pt had abused amphetamines : quit in 2005  . Hypertension 2005  . Impaired fasting blood sugar 2016  . Keloid scar of skin 06/21/2016  . Lipoma 07/19/2019  . Low testosterone in male 05/24/2018  . Metatarsalgia of right foot 06/21/2016  . Mixed dyslipidemia 09/05/2018  . Nail deformity 07/20/2015  . Need for prophylactic vaccination against Streptococcus pneumoniae (pneumococcus) 12/17/2015  . Need  for prophylactic vaccination and inoculation against influenza 12/17/2015  . Onychomycosis   . OSA on CPAP 10/31/2016  . 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 CARDIAC CATHETERIZATION N/A 05/29/2015   Procedure: Left Heart Cath and Coronary Angiography;  Surgeon: Jettie Booze, MD;  Location: [facility];  Service: Cardiovascular;  Laterality: N/A;  . CARDIAC CATHETERIZATION  05/29/2015   Procedure: Coronary Balloon Angioplasty;  Surgeon: Jettie Booze, MD;  Location: [facility];  Service: Cardiovascular;;  . CORONARY ARTERY BYPASS GRAFT N/A 06/02/2015   Procedure: CORONARY ARTERY BYPASS GRAFTING (CABG) x  3 utilizing bilateral internal mammary artery and endoscopically harvested right sapheneous vein.;  Surgeon: [name], MD;  Location: [facility];  Service: Open Heart Surgery;  Laterality: N/A;  . RHINOPLASTY  [id number]   w/repair of nasal fractuare; done at [facility]   . TEE WITHOUT CARDIOVERSION N/A 06/02/2015   Procedure: TRANSESOPHAGEAL ECHOCARDIOGRAM (TEE);  Surgeon: [name], MD;  Location: [facility];  Service: Open Heart Surgery;  Laterality: N/A;    Current Medications: Current Meds  Medication Sig  . amLODipine (NORVASC) 10 MG tablet TAKE ONE TABLET BY MOUTH DAILY  . aspirin 81 MG tablet Take 81 mg by mouth daily.  Marland Kitchen lisinopril-hydrochlorothiazide (ZESTORETIC) 20-25 MG tablet TAKE ONE TABLET BY MOUTH DAILY  . metoprolol succinate (TOPROL-XL) 25 MG 24 hr tablet Take 1 tablet (25 mg total) by mouth daily.  . nitroGLYCERIN (NITROSTAT) 0.4 MG SL tablet Place 1 tablet (0.4 mg total) under the tongue every 5 (five) minutes as needed for chest pain.  . pantoprazole (PROTONIX) 40 MG tablet TAKE ONE TABLET BY MOUTH DAILY  . rosuvastatin (CRESTOR) 20 MG tablet TAKE ONE TABLET BY MOUTH DAILY  . tadalafil (CIALIS) 5 MG tablet TAKE ONE TABLET BY MOUTH DAILY AS NEEDED FOR ERECTILE DYSFUNCTION  . triamcinolone cream (KENALOG) 0.1 %  Apply 1 application topically 2 (two) times daily.     Allergies:   Amoxicillin and Fluoxetine   Social History   Socioeconomic History  . Marital status: Legally Separated    Spouse name: Not on file  . Number of children: 3  . Years of education: 33   . Highest education level: Not on file  Occupational History  . Occupation: Sports coach    Comment: unemployed   Tobacco Use  . Smoking status: Former Smoker    Packs/day: 1.00    Years: 30.00    Pack years: 30.00    Types: Cigarettes  . Smokeless tobacco: Never Used  Vaping Use  . Vaping Use: Some days  . Substances: Nicotine  Substance and Sexual Activity  . Alcohol use: Yes    Alcohol/week: 1.0 standard drink    Types: 1 Standard drinks or equivalent per week  Comment: rarely  . Drug use: Not Currently    Frequency: 1.0 times per week    Types: Marijuana    Comment: 05/29/2015 "none in the last 10 years"  . Sexual activity: Yes  Other Topics Concern  . 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   06/2019   Social Determinants of Health   Financial Resource Strain:   . Difficulty of Paying Living Expenses: Not on file  Food Insecurity:   . Worried About Charity fundraiser in the Last Year: Not on file  . Ran Out of Food in the Last Year: Not on file  Transportation Needs:   . Lack of Transportation (Medical): Not on file  . Lack of Transportation (Non-Medical): Not on file  Physical Activity:   . Days of Exercise per Week: Not on file  . Minutes of Exercise per Session: Not on file  Stress:   . Feeling of Stress : Not on file  Social Connections:   . Frequency of Communication with Friends and Family: Not on file  . Frequency of Social Gatherings with Friends and Family: Not on file  . Attends Religious Services: Not on file  . Active  Member of Clubs or Organizations: Not on file  . Attends Archivist Meetings: Not on file  . Marital Status: Not on file     Family History: The patient's family history includes Cancer in his maternal aunt; Diabetes in his father and mother; HIV in his sister; Heart disease in his mother; Heart disease (age of onset: 70) in his father; Heart failure in his father. There is no history of Stroke.  ROS:   Please see the history of present illness.    All other systems reviewed and are negative.  EKGs/Labs/Other Studies Reviewed:    The following studies were reviewed today: Discussed my findings with the patient at length.   Recent Labs: 04/11/2019: ALT 49 09/21/2019: BUN 14; Creatinine, Ser 0.90; Hemoglobin 16.0; Platelets 325; Potassium 3.8; Sodium 140  Recent Lipid Panel    Component Value Date/Time   CHOL 157 04/11/2019 1102   TRIG 158 (H) 04/11/2019 1102   HDL 49 04/11/2019 1102   CHOLHDL 3.2 04/11/2019 1102   CHOLHDL 5.6 (H) 10/31/2016 1500   VLDL 74 (H) 10/31/2016 1500   LDLCALC 81 04/11/2019 1102    Physical Exam:    VS:  BP 138/80   Pulse 85   Ht 5\' 10"  (1.778 m)   Wt 209 lb (94.8 kg)   SpO2 97%   BMI 29.99 kg/m     Wt Readings from Last 3 Encounters:  02/04/20 209 lb (94.8 kg)  01/31/20 208 lb (94.3 kg)  10/18/19 (!) 218 lb (98.9 kg)     GEN: Patient is in no acute distress HEENT: Normal NECK: No JVD; No carotid bruits LYMPHATICS: No lymphadenopathy CARDIAC: Hear sounds regular, 2/6 systolic murmur at the apex. RESPIRATORY:  Clear to auscultation without rales, wheezing or rhonchi  ABDOMEN: Soft, non-tender, non-distended MUSCULOSKELETAL:  No edema; No deformity  SKIN: Warm and dry NEUROLOGIC:  Alert and oriented x 3 PSYCHIATRIC:  Normal affect   Signed, Jenean Lindau, MD  02/04/2020 8:50 AM    Gap

## 2020-02-06 ENCOUNTER — Telehealth (HOSPITAL_COMMUNITY): Payer: Self-pay | Admitting: *Deleted

## 2020-02-06 NOTE — Telephone Encounter (Signed)
Close encounter 

## 2020-02-07 ENCOUNTER — Encounter: Payer: Self-pay | Admitting: Medical

## 2020-02-07 ENCOUNTER — Other Ambulatory Visit: Payer: Self-pay

## 2020-02-07 ENCOUNTER — Ambulatory Visit (INDEPENDENT_AMBULATORY_CARE_PROVIDER_SITE_OTHER): Payer: Self-pay | Admitting: Medical

## 2020-02-07 VITALS — BP 124/78 | HR 77 | Ht 70.0 in | Wt 206.4 lb

## 2020-02-07 DIAGNOSIS — Z951 Presence of aortocoronary bypass graft: Secondary | ICD-10-CM

## 2020-02-07 DIAGNOSIS — Z9989 Dependence on other enabling machines and devices: Secondary | ICD-10-CM

## 2020-02-07 DIAGNOSIS — Z87891 Personal history of nicotine dependence: Secondary | ICD-10-CM

## 2020-02-07 DIAGNOSIS — K219 Gastro-esophageal reflux disease without esophagitis: Secondary | ICD-10-CM

## 2020-02-07 DIAGNOSIS — E291 Testicular hypofunction: Secondary | ICD-10-CM

## 2020-02-07 DIAGNOSIS — I2510 Atherosclerotic heart disease of native coronary artery without angina pectoris: Secondary | ICD-10-CM

## 2020-02-07 DIAGNOSIS — G4733 Obstructive sleep apnea (adult) (pediatric): Secondary | ICD-10-CM

## 2020-02-07 DIAGNOSIS — R7989 Other specified abnormal findings of blood chemistry: Secondary | ICD-10-CM | POA: Insufficient documentation

## 2020-02-07 DIAGNOSIS — R7301 Impaired fasting glucose: Secondary | ICD-10-CM

## 2020-02-07 DIAGNOSIS — E782 Mixed hyperlipidemia: Secondary | ICD-10-CM

## 2020-02-07 DIAGNOSIS — K760 Fatty (change of) liver, not elsewhere classified: Secondary | ICD-10-CM

## 2020-02-07 DIAGNOSIS — I10 Essential (primary) hypertension: Secondary | ICD-10-CM

## 2020-02-07 DIAGNOSIS — N400 Enlarged prostate without lower urinary tract symptoms: Secondary | ICD-10-CM

## 2020-02-07 HISTORY — DX: Testicular hypofunction: E29.1

## 2020-02-07 NOTE — Progress Notes (Signed)
Subjective:  Donald Oliver is a 49 y.o. male who presents for Chief Complaint  Patient presents with  . Medication Management     Here for medication management.    Got a new job in July, better hours, getting better sleep, exercising more.  Still in the same employer.  Employer is requiring a new stress test.  Has this scheduled next Tuesday.    Hypertension - compliant with medication  Hyperlipidemia, CAD - compliant with Crestor 20mg   GERD - taking protonix  Impaired fasting glucose - has been working on [employer], exercise  Fatty liver disease - has been working on [employer], exercise  Quit Wellbutrin in August, felt like mood was fine.    Low testosterone - using 2 pumps daily, using compounded cream through [facility]  Nonsmoker.    Has a girlfriend now.  OSA - compliant with CPAP nightly.   No other aggravating or relieving factors.    No other c/o.  Past Medical History:  Diagnosis Date  . Benign prostatic hyperplasia 09/05/2018  . Bilateral high scrotal testes 05/23/2019  . CAD (coronary artery disease), native coronary artery 06/01/2015   Cath 3/3 80% LAD, 99% diag, 50% circ, RCA 100% with Lto R collaterals  CABG 06/02/15 Dr. Cyndia Bent with LIMA to LAD, free RIMA to Diag and SVG to RCA    . Combined hyperlipidemia   . Depressed mood 07/19/2019  . Eczema 09/05/2018  . Elevated LFTs 05/24/2018  . Encounter for health maintenance examination in adult 02/25/2015  . Erectile dysfunction   . Essential hypertension   . Family history of premature CAD   . Fatigue 05/24/2018  . Fatty liver disease, nonalcoholic 5/40/9811  . FH: premature coronary heart disease    Father had MI at age 41   . Former smoker 08/29/2016  . Gastroesophageal reflux disease 02/25/2015  . GERD (gastroesophageal reflux disease)   . History of substance abuse ([place])    Pt had abused amphetamines : quit in 2005  . Hypertension 2005  . Impaired fasting blood sugar 2016  . Keloid  scar of skin 06/21/2016  . Lipoma 07/19/2019  . Low testosterone in male 05/24/2018  . Metatarsalgia of right foot 06/21/2016  . Mixed dyslipidemia 09/05/2018  . Nail deformity 07/20/2015  . Need for prophylactic vaccination against Streptococcus pneumoniae (pneumococcus) 12/17/2015  . Need for prophylactic vaccination and inoculation against influenza 12/17/2015  . Onychomycosis   . OSA on CPAP 10/31/2016  . 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   Current Outpatient Medications on File Prior to Visit  Medication Sig Dispense Refill  . amLODipine (NORVASC) 10 MG tablet TAKE ONE TABLET BY MOUTH DAILY 90 tablet 2  . aspirin 81 MG tablet Take 81 mg by mouth daily.    Marland Kitchen lisinopril-hydrochlorothiazide (ZESTORETIC) 20-25 MG tablet TAKE ONE TABLET BY MOUTH DAILY 90 tablet 2  . pantoprazole (PROTONIX) 40 MG tablet TAKE ONE TABLET BY MOUTH DAILY 90 tablet 3  . rosuvastatin (CRESTOR) 20 MG tablet TAKE ONE TABLET BY MOUTH DAILY 90 tablet 2  . tadalafil (CIALIS) 5 MG tablet TAKE ONE TABLET BY MOUTH DAILY AS NEEDED FOR ERECTILE DYSFUNCTION 90 tablet 0  . metoprolol succinate (TOPROL-XL) 25 MG 24 hr tablet Take 1 tablet (25 mg total) by mouth daily. (Patient not taking: Reported on 02/07/2020) 90 tablet 1  . nitroGLYCERIN (NITROSTAT) 0.4 MG SL tablet Place 1 tablet (0.4 mg total) under the tongue every 5 (five) minutes as needed for chest pain. (Patient  not taking: Reported on 02/07/2020) 90 tablet 3  . triamcinolone cream (KENALOG) 0.1 % Apply 1 application topically 2 (two) times daily. (Patient not taking: Reported on 02/07/2020) 30 g 1   No current facility-administered medications on file prior to visit.   Family History  Problem Relation Age of Onset  . Heart failure Father   . Heart disease Father 12       died of MI  . Diabetes Father   . Heart disease Mother        CAD, stents  . Diabetes Mother   . HIV Sister   . Cancer Maternal Aunt        lung, smoker  . Stroke Neg Hx      The following portions of the  patient's history were reviewed and updated as appropriate: allergies, current medications, past family history, past medical history, past social history, past surgical history and problem list.  ROS Otherwise as in subjective above   Objective: BP 124/78   Pulse 77   Ht 5\' 10"  (1.778 m)   Wt 206 lb 6.4 oz (93.6 kg)   SpO2 97%   BMI 29.62 kg/m   Wt Readings from Last 3 Encounters:  02/07/20 206 lb 6.4 oz (93.6 kg)  02/04/20 209 lb (94.8 kg)  01/31/20 208 lb (94.3 kg)   General appearance: alert, no distress, well developed, well nourished Neck: supple, no lymphadenopathy, no thyromegaly, no masses, no bruits Heart: RRR, normal S1, S2, no murmurs Lungs: CTA bilaterally, no wheezes, rhonchi, or rales Pulses: 2+ radial pulses, 2+ pedal pulses, normal cap refill Ext: no edema   Assessment: Encounter Diagnoses  Name Primary?  . Coronary artery disease involving native coronary artery of native heart without angina pectoris Yes  . Essential hypertension   . OSA on CPAP   . Fatty liver disease, nonalcoholic   . Gastroesophageal reflux disease, unspecified whether esophagitis present   . Impaired fasting blood sugar   . Benign prostatic hyperplasia, unspecified whether lower urinary tract symptoms present   . Combined hyperlipidemia   . Former smoker   . Mixed dyslipidemia   . S/P CABG x 3   . Low testosterone   . Hypogonadism in male      Plan: Low testosterone, hypogonadism - lab today, c/t cream [facility]  OSA - compliant with CPAP, getting supplies online since he currently doesn't have insurance  HTN - wants to stay off metoprolol due to weight gain and not able to get heart rate up with exercise.   Off it currently for stress test next week.  Compliant with rest of medication  CAD, hyperlipidemia - f/u next week with cardiology for stress test, c/t statin, BP medication, aspirin  Impaired fasting glucose - counseled on healthy  lifestyle, plan to check labs next visit when he will hopefully have insurance   [name] was seen today for medication management.  Diagnoses and all orders for this visit:  Coronary artery disease involving native coronary artery of native heart without angina pectoris  Essential hypertension  OSA on CPAP  Fatty liver disease, nonalcoholic  Gastroesophageal reflux disease, unspecified whether esophagitis present  Impaired fasting blood sugar  Benign prostatic hyperplasia, unspecified whether lower urinary tract symptoms present  Combined hyperlipidemia  Former smoker  Mixed dyslipidemia  S/P CABG x 3  Low testosterone -     Testosterone  Hypogonadism in male -     Testosterone    Follow up: pending lab

## 2020-02-08 ENCOUNTER — Other Ambulatory Visit (HOSPITAL_COMMUNITY)
Admission: RE | Admit: 2020-02-08 | Discharge: 2020-02-08 | Disposition: A | Discharge status: Home / Self Care | Payer: Medicaid Other | Source: Ambulatory Visit | Attending: Cardiovascular Disease | Admitting: Cardiovascular Disease

## 2020-02-08 DIAGNOSIS — Z01812 Encounter for preprocedural laboratory examination: Secondary | ICD-10-CM | POA: Diagnosis not present

## 2020-02-08 DIAGNOSIS — Z20822 Contact with and (suspected) exposure to covid-19: Secondary | ICD-10-CM | POA: Diagnosis not present

## 2020-02-08 LAB — TESTOSTERONE: Testosterone: 296 ng/dL (ref 264–916)

## 2020-02-08 LAB — SARS CORONAVIRUS 2 (TAT 6-24 HRS): SARS Coronavirus 2: NEGATIVE

## 2020-02-10 ENCOUNTER — Other Ambulatory Visit: Payer: Self-pay | Admitting: Medical

## 2020-02-10 ENCOUNTER — Telehealth: Payer: Self-pay

## 2020-02-10 MED ORDER — TESTOSTERONE 20 % CREA: 3.0000 | TOPICAL_CREAM | Freq: Every day | 5 refills | Status: DC

## 2020-02-10 NOTE — Telephone Encounter (Signed)
Bethany from [facility] called stating she was confused by the prescription they received for his Testosterone cream they were confused by the 1.5 because he usually takes 1 ml which is 4 clicks and it was changed from 2 to 3 times each side. If someone could call her back at [phone].

## 2020-02-10 NOTE — Telephone Encounter (Signed)
Ok, I want to increase his dose.  So if he was doing 70mil or 4 clicks, lets change to 2 mil daily or 8 clicks daily

## 2020-02-10 NOTE — Telephone Encounter (Signed)
Pharmacists aware of changes and will change his prescription.

## 2020-02-11 ENCOUNTER — Ambulatory Visit (HOSPITAL_COMMUNITY)
Admission: RE | Admit: 2020-02-11 | Discharge: 2020-02-11 | Disposition: A | Discharge status: Home / Self Care | Payer: Self-pay | Source: Ambulatory Visit | Attending: Cardiovascular Disease | Admitting: Cardiovascular Disease

## 2020-02-11 ENCOUNTER — Other Ambulatory Visit: Payer: Self-pay

## 2020-02-11 DIAGNOSIS — I2510 Atherosclerotic heart disease of native coronary artery without angina pectoris: Secondary | ICD-10-CM | POA: Insufficient documentation

## 2020-02-11 DIAGNOSIS — Z951 Presence of aortocoronary bypass graft: Secondary | ICD-10-CM | POA: Insufficient documentation

## 2020-02-11 LAB — MYOCARDIAL PERFUSION IMAGING
Estimated workload: 12.4 METS
Exercise duration (min): 11 min
Exercise duration (sec): 10 s
LV dias vol: 137 mL (ref 62–150)
LV sys vol: 63 mL
MPHR: 170 {beats}/min
Peak HR: 157 {beats}/min
Percent HR: 92 %
Rest HR: 56 {beats}/min
SDS: 3
SRS: 2
SSS: 5
TID: 0.79

## 2020-02-11 MED ORDER — TECHNETIUM TC 99M TETROFOSMIN IV KIT
31.8000 | PACK | Freq: Once | INTRAVENOUS | Status: AC | PRN
  Administered 2020-02-11: 31.8 via INTRAVENOUS
  Filled 2020-02-11: qty 32

## 2020-02-11 MED ORDER — TECHNETIUM TC 99M TETROFOSMIN IV KIT
9.9000 | PACK | Freq: Once | INTRAVENOUS | Status: AC | PRN
  Administered 2020-02-11: 9.9 via INTRAVENOUS
  Filled 2020-02-11: qty 10

## 2020-02-13 ENCOUNTER — Telehealth: Payer: Self-pay

## 2020-02-13 NOTE — Telephone Encounter (Signed)
Called and spoke with patient informing him Dr. Geraldo Pitter had signed his paperwork for his release and have faxed the papers to the company.  He will pick up the forms from [place] at the [facility] office tomorrow.

## 2020-04-02 NOTE — Progress Notes (Signed)
Cardiology Office Note:    Date:  04/03/2020   ID:  Donald Oliver, DOB 12-Jun-1970, MRN [id number]  PCP:  Carlena Hurl, PA-C  Cardiologist:  Shirlee More, MD    Referring MD: Carlena Hurl, PA-C    ASSESSMENT:    1. Coronary artery disease involving native coronary artery of native heart without angina pectoris   2. S/P CABG x 3   3. Essential hypertension   4. Mixed dyslipidemia    PLAN:    In order of problems listed above:  1. Mr. Donald Oliver continues to do well with a good long-term durable result from his coronary artery bypass surgery.  Presently asymptomatic New [place] Heart Association class I continue treatment with aspirin beta-blocker and his high intensity statin blood pressure is at target continue current treatment we will check liver function lipid profile continue high intensity statin.   Next appointment: 6 months   Medication Adjustments/Labs and Tests Ordered: Current medicines are reviewed at length with the patient today.  Concerns regarding medicines are outlined above.  Orders Placed This Encounter  Procedures  . Comprehensive metabolic panel  . Lipid panel   No orders of the defined types were placed in this encounter.   No chief complaint on file.   History of Present Illness:    Donald Oliver is a 50 y.o. male with a hx of CAD with CABG 2017, hypertension and hyperlipidemia last seen 02/04/2020. Compliance with diet, lifestyle and medications: Yes  MPI 02/12/2020: Study Highlights    The left ventricular ejection fraction is mildly decreased (45-54%).  Nuclear stress EF: 54%.  Blood pressure demonstrated a normal response to exercise.  There was no ST segment deviation noted during stress.  Defect 1: There is a small defect of mild severity present in the basal inferior and mid inferior location.  The study is normal.  This is a low risk study.  He is doing well no chest pain shortness of breath palpitation or  syncope.  I reviewed his chart his troponin elevation was flat nonspecific and not in the context of acute coronary syndrome in the ED  His perfusion study shows no ischemia and what is best described as a low normal to minimally reduced ejection fraction.  He is reassured tolerates his statin without muscle pain or weakness  He is ambivalent and I asked him to start his mRNA vaccine for Covid immediately Past Medical History:  Diagnosis Date  . Benign prostatic hyperplasia 09/05/2018  . Bilateral high scrotal testes 05/23/2019  . CAD (coronary artery disease), native coronary artery 06/01/2015   Cath 3/3 80% LAD, 99% diag, 50% circ, RCA 100% with Lto R collaterals  CABG 06/02/15 Dr. Cyndia Bent with LIMA to LAD, free RIMA to Diag and SVG to RCA    . Combined hyperlipidemia   . Depressed mood 07/19/2019  . Eczema 09/05/2018  . Elevated LFTs 05/24/2018  . Encounter for health maintenance examination in adult 02/25/2015  . Erectile dysfunction   . Essential hypertension   . Family history of premature CAD   . Fatigue 05/24/2018  . Fatty liver disease, nonalcoholic [id number]  . FH: premature coronary heart disease    Father had MI at age 87   . Former smoker 08/29/2016  . Gastroesophageal reflux disease 02/25/2015  . GERD (gastroesophageal reflux disease)   . History of substance abuse ([place])    Pt had abused amphetamines : quit in 2005  . Hypertension 2005  . Impaired fasting blood sugar  2016  . Keloid scar of skin 06/21/2016  . Lipoma 07/19/2019  . Low testosterone in male 05/24/2018  . Metatarsalgia of right foot 06/21/2016  . Mixed dyslipidemia 09/05/2018  . Nail deformity 07/20/2015  . Need for prophylactic vaccination against Streptococcus pneumoniae (pneumococcus) 12/17/2015  . Need for prophylactic vaccination and inoculation against influenza 12/17/2015  . Onychomycosis   . OSA on CPAP 10/31/2016  . 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 CARDIAC  CATHETERIZATION N/A 05/29/2015   Procedure: Left Heart Cath and Coronary Angiography;  Surgeon: Jettie Booze, MD;  Location: [facility];  Service: Cardiovascular;  Laterality: N/A;  . CARDIAC CATHETERIZATION  05/29/2015   Procedure: Coronary Balloon Angioplasty;  Surgeon: Jettie Booze, MD;  Location: [facility];  Service: Cardiovascular;;  . CORONARY ARTERY BYPASS GRAFT N/A 06/02/2015   Procedure: CORONARY ARTERY BYPASS GRAFTING (CABG) x  3 utilizing bilateral internal mammary artery and endoscopically harvested right sapheneous vein.;  Surgeon: [name], MD;  Location: [facility];  Service: Open Heart Surgery;  Laterality: N/A;  . RHINOPLASTY  [id number]   w/repair of nasal fractuare; done at [facility]   . TEE WITHOUT CARDIOVERSION N/A 06/02/2015   Procedure: TRANSESOPHAGEAL ECHOCARDIOGRAM (TEE);  Surgeon: [name], MD;  Location: [facility];  Service: Open Heart Surgery;  Laterality: N/A;    Current Medications: Current Meds  Medication Sig  . amLODipine (NORVASC) 10 MG tablet TAKE ONE TABLET BY MOUTH DAILY  . aspirin 81 MG tablet Take 81 mg by mouth daily.  Marland Kitchen lisinopril-hydrochlorothiazide (ZESTORETIC) 20-25 MG tablet TAKE ONE TABLET BY MOUTH DAILY  . pantoprazole (PROTONIX) 40 MG tablet TAKE ONE TABLET BY MOUTH DAILY  . rosuvastatin (CRESTOR) 20 MG tablet TAKE ONE TABLET BY MOUTH DAILY  . tadalafil (CIALIS) 5 MG tablet TAKE ONE TABLET BY MOUTH DAILY AS NEEDED FOR ERECTILE DYSFUNCTION  . Testosterone 20 % CREA 3 Doses by Does not apply route daily.     Allergies:   Amoxicillin and Fluoxetine   Social History   Socioeconomic History  . Marital status: Legally Separated    Spouse name: Not on file  . Number of children: 3  . Years of education: 22   . Highest education level: Not on file  Occupational History  . Occupation: Sports coach    Comment: unemployed   Tobacco Use  . Smoking status: Former Smoker    Packs/day: 1.00    Years: 30.00     Pack years: 30.00    Types: Cigarettes  . Smokeless tobacco: Never Used  Vaping Use  . Vaping Use: Some days  . Substances: Nicotine  Substance and Sexual Activity  . Alcohol use: Yes    Alcohol/week: 1.0 standard drink    Types: 1 Standard drinks or equivalent per week    Comment: rarely  . Drug use: Not Currently    Frequency: 1.0 times per week    Types: Marijuana    Comment: 05/29/2015 "none in the last 10 years"  . Sexual activity: Yes  Other Topics Concern  . 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   06/2019   Social Determinants of Health   Financial Resource Strain: Not on file  Food Insecurity: Not on file  Transportation Needs:  Not on file  Physical Activity: Not on file  Stress: Not on file  Social Connections: Not on file     Family History: The patient's family history includes Cancer in his maternal aunt; Diabetes in his father and mother; HIV in his sister; Heart disease in his mother; Heart disease (age of onset: 89) in his father; Heart failure in his father. There is no history of Stroke. ROS:   Please see the history of present illness.    All other systems reviewed and are negative.  EKGs/Labs/Other Studies Reviewed:    The following studies were reviewed today:  EKG:  EKG 02/11/2020 at the time myocardial perfusion study independently reviewed sinus rhythm right axis deviation  Recent Labs: 04/11/2019: ALT 49 09/21/2019: BUN 14; Creatinine, Ser 0.90; Hemoglobin 16.0; Platelets 325; Potassium 3.8; Sodium 140  Recent Lipid Panel    Component Value Date/Time   CHOL 157 04/11/2019 1102   TRIG 158 (H) 04/11/2019 1102   HDL 49 04/11/2019 1102   CHOLHDL 3.2 04/11/2019 1102   CHOLHDL 5.6 (H) 10/31/2016 1500   VLDL 74 (H) 10/31/2016 1500   LDLCALC 81 04/11/2019 1102    Physical Exam:    VS:   BP 130/80   Pulse 86   Ht 5\' 10"  (1.778 m)   Wt 207 lb (93.9 kg)   SpO2 97%   BMI 29.70 kg/m     Wt Readings from Last 3 Encounters:  04/03/20 207 lb (93.9 kg)  02/11/20 209 lb (94.8 kg)  02/07/20 206 lb 6.4 oz (93.6 kg)     GEN:  Well nourished, well developed in no acute distress HEENT: Normal NECK: No JVD; No carotid bruits LYMPHATICS: No lymphadenopathy CARDIAC: RRR, no murmurs, rubs, gallops RESPIRATORY:  Clear to auscultation without rales, wheezing or rhonchi  ABDOMEN: Soft, non-tender, non-distended MUSCULOSKELETAL:  No edema; No deformity  SKIN: Warm and dry NEUROLOGIC:  Alert and oriented x 3 PSYCHIATRIC:  Normal affect    Signed, 13/12/21, MD  04/03/2020 8:56 AM    [facility]

## 2020-04-03 ENCOUNTER — Encounter: Payer: Self-pay | Admitting: Cardiology

## 2020-04-03 ENCOUNTER — Other Ambulatory Visit: Payer: Self-pay

## 2020-04-03 ENCOUNTER — Ambulatory Visit: Payer: BC Managed Care – PPO | Admitting: Cardiology

## 2020-04-03 VITALS — BP 130/80 | HR 86 | Ht 70.0 in | Wt 207.0 lb

## 2020-04-03 DIAGNOSIS — I10 Essential (primary) hypertension: Secondary | ICD-10-CM

## 2020-04-03 DIAGNOSIS — E782 Mixed hyperlipidemia: Secondary | ICD-10-CM | POA: Diagnosis not present

## 2020-04-03 DIAGNOSIS — I2510 Atherosclerotic heart disease of native coronary artery without angina pectoris: Secondary | ICD-10-CM

## 2020-04-03 DIAGNOSIS — Z951 Presence of aortocoronary bypass graft: Secondary | ICD-10-CM | POA: Diagnosis not present

## 2020-04-03 MED ORDER — OMEGA-3-ACID ETHYL ESTERS 1 G PO CAPS: 1.0000 g | ORAL_CAPSULE | Freq: Two times a day (BID) | ORAL | 3 refills | Status: DC

## 2020-04-03 NOTE — Patient Instructions (Signed)

## 2020-04-04 LAB — LIPID PANEL
Chol/HDL Ratio: 3.4 ratio (ref 0.0–5.0)
Cholesterol, Total: 134 mg/dL (ref 100–199)
HDL: 40 mg/dL (ref >39)
LDL Chol Calc (NIH): 58 mg/dL (ref 0–99)
Triglycerides: 221 mg/dL — ABNORMAL HIGH (ref 0–149)
VLDL Cholesterol Cal: 36 mg/dL (ref 5–40)

## 2020-04-04 LAB — COMPREHENSIVE METABOLIC PANEL
ALT: 30 IU/L (ref 0–44)
AST: 22 IU/L (ref 0–40)
Albumin/Globulin Ratio: 1.6 (ref 1.2–2.2)
Albumin: 4.4 g/dL (ref 4.0–5.0)
Alkaline Phosphatase: 74 IU/L (ref 44–121)
BUN/Creatinine Ratio: 14 (ref 9–20)
BUN: 13 mg/dL (ref 6–24)
Bilirubin Total: 0.8 mg/dL (ref 0.0–1.2)
CO2: 23 mmol/L (ref 20–29)
Calcium: 9.6 mg/dL (ref 8.7–10.2)
Chloride: 101 mmol/L (ref 96–106)
Creatinine, Ser: 0.91 mg/dL (ref 0.76–1.27)
GFR calc Af Amer: 113 mL/min/{1.73_m2} (ref >59)
GFR calc non Af Amer: 98 mL/min/{1.73_m2} (ref >59)
Globulin, Total: 2.7 g/dL (ref 1.5–4.5)
Glucose: 122 mg/dL — ABNORMAL HIGH (ref 65–99)
Potassium: 3.5 mmol/L (ref 3.5–5.2)
Sodium: 140 mmol/L (ref 134–144)
Total Protein: 7.1 g/dL (ref 6.0–8.5)

## 2020-04-06 ENCOUNTER — Telehealth: Payer: Self-pay

## 2020-04-06 NOTE — Telephone Encounter (Signed)
Tried calling patient. No answer and no voicemail set up for me to leave a message. 

## 2020-04-06 NOTE — Telephone Encounter (Signed)
-----   Message from Richardo Priest, MD sent at 04/05/2020 10:37 AM EST ----- Good result LDL at target no changes

## 2020-04-07 ENCOUNTER — Telehealth: Payer: Self-pay

## 2020-04-07 NOTE — Telephone Encounter (Signed)
-----   Message from Brian J Munley, MD sent at 04/05/2020 10:37 AM EST ----- Good result LDL at target no changes 

## 2020-04-07 NOTE — Telephone Encounter (Signed)
Left message on patients voicemail to please return our call.   

## 2020-04-08 ENCOUNTER — Telehealth: Payer: Self-pay

## 2020-04-08 NOTE — Telephone Encounter (Signed)
-----   Message from Brian J Munley, MD sent at 04/05/2020 10:37 AM EST ----- Good result LDL at target no changes 

## 2020-04-08 NOTE — Telephone Encounter (Signed)
Tried calling patient. No answer and no voicemail set up for me to leave a message.  I will mail the patient a letter at this time after trying to reach him x3 with no success.

## 2020-04-18 ENCOUNTER — Other Ambulatory Visit: Payer: Self-pay | Admitting: Cardiology

## 2020-05-01 ENCOUNTER — Other Ambulatory Visit: Payer: Self-pay | Admitting: Medical

## 2020-05-01 ENCOUNTER — Other Ambulatory Visit: Payer: Self-pay | Admitting: Cardiology

## 2020-05-12 DIAGNOSIS — Z20822 Contact with and (suspected) exposure to covid-19: Secondary | ICD-10-CM | POA: Diagnosis not present

## 2020-07-09 ENCOUNTER — Ambulatory Visit (INDEPENDENT_AMBULATORY_CARE_PROVIDER_SITE_OTHER): Payer: BC Managed Care – PPO | Admitting: Medical

## 2020-07-09 ENCOUNTER — Other Ambulatory Visit: Payer: Self-pay

## 2020-07-09 ENCOUNTER — Encounter: Payer: Self-pay | Admitting: Medical

## 2020-07-09 VITALS — BP 130/80 | HR 75 | Ht 70.0 in | Wt 207.6 lb

## 2020-07-09 DIAGNOSIS — Z125 Encounter for screening for malignant neoplasm of prostate: Secondary | ICD-10-CM | POA: Diagnosis not present

## 2020-07-09 DIAGNOSIS — R7989 Other specified abnormal findings of blood chemistry: Secondary | ICD-10-CM | POA: Diagnosis not present

## 2020-07-09 DIAGNOSIS — H9313 Tinnitus, bilateral: Secondary | ICD-10-CM

## 2020-07-09 DIAGNOSIS — Z9989 Dependence on other enabling machines and devices: Secondary | ICD-10-CM

## 2020-07-09 DIAGNOSIS — F419 Anxiety disorder, unspecified: Secondary | ICD-10-CM

## 2020-07-09 DIAGNOSIS — I10 Essential (primary) hypertension: Secondary | ICD-10-CM

## 2020-07-09 DIAGNOSIS — N529 Male erectile dysfunction, unspecified: Secondary | ICD-10-CM

## 2020-07-09 DIAGNOSIS — F32A Depression, unspecified: Secondary | ICD-10-CM

## 2020-07-09 DIAGNOSIS — R7301 Impaired fasting glucose: Secondary | ICD-10-CM | POA: Diagnosis not present

## 2020-07-09 DIAGNOSIS — Z23 Encounter for immunization: Secondary | ICD-10-CM

## 2020-07-09 DIAGNOSIS — E291 Testicular hypofunction: Secondary | ICD-10-CM | POA: Diagnosis not present

## 2020-07-09 DIAGNOSIS — Z79899 Other long term (current) drug therapy: Secondary | ICD-10-CM | POA: Diagnosis not present

## 2020-07-09 DIAGNOSIS — G4733 Obstructive sleep apnea (adult) (pediatric): Secondary | ICD-10-CM

## 2020-07-09 DIAGNOSIS — Z1211 Encounter for screening for malignant neoplasm of colon: Secondary | ICD-10-CM

## 2020-07-09 DIAGNOSIS — Z7185 Encounter for immunization safety counseling: Secondary | ICD-10-CM

## 2020-07-09 DIAGNOSIS — I2510 Atherosclerotic heart disease of native coronary artery without angina pectoris: Secondary | ICD-10-CM

## 2020-07-09 DIAGNOSIS — Z951 Presence of aortocoronary bypass graft: Secondary | ICD-10-CM

## 2020-07-09 HISTORY — DX: Encounter for screening for malignant neoplasm of prostate: Z12.5

## 2020-07-09 HISTORY — DX: Encounter for immunization safety counseling: Z71.85

## 2020-07-09 HISTORY — DX: Other long term (current) drug therapy: Z79.899

## 2020-07-09 HISTORY — DX: Depression, unspecified: F32.A

## 2020-07-09 HISTORY — DX: Anxiety disorder, unspecified: F41.9

## 2020-07-09 HISTORY — DX: Tinnitus, bilateral: H93.13

## 2020-07-09 MED ORDER — BUPROPION HCL ER (XL) 150 MG PO TB24
150.0000 mg | ORAL_TABLET | Freq: Every day | ORAL | 2 refills | Status: DC
Start: 2020-07-09 — End: 2020-10-09

## 2020-07-09 NOTE — Progress Notes (Signed)
Done

## 2020-07-09 NOTE — Progress Notes (Signed)
Subjective:  Donald Oliver is a 50 y.o. male who presents for Chief Complaint  Patient presents with  . Follow-up    Discuss cpap      Medical team: Dr. Shirlee More, cardiology Dr. Ila Mcgill, podiatry Eye doctor  Dentist Lonzie Simmer, Camelia Eng, PA-C here for primary care   Here for medication management.    Hypertension - compliant with medication  Hyperlipidemia, CAD - compliant with Crestor 20mg , Lovaza compliant  GERD - taking protonix  Impaired fasting glucose - has been working on [employer], exercise  Quit Wellbutrin in August, but feels like he needs to restart this.  Lately mood has been down, gets anxioius at times.  Low testosterone - using 4 clicks daily, using compounded cream through [facility]. Seems to be doing fine on this  Taking Cialis 5mg  daily without c/o.  Nonsmoker.    OSA - compliant with CPAP nightly.  No other aggravating or relieving factors.    No other c/o.  Past Medical History:  Diagnosis Date  . Benign prostatic hyperplasia 09/05/2018  . Bilateral high scrotal testes 05/23/2019  . CAD (coronary artery disease), native coronary artery 06/01/2015   Cath 3/3 80% LAD, 99% diag, 50% circ, RCA 100% with Lto R collaterals  CABG 06/02/15 Dr. Cyndia Bent with LIMA to LAD, free RIMA to Diag and SVG to RCA    . Combined hyperlipidemia   . Depressed mood 07/19/2019  . Eczema 09/05/2018  . Elevated LFTs 05/24/2018  . Encounter for health maintenance examination in adult 02/25/2015  . Erectile dysfunction   . Essential hypertension   . Family history of premature CAD   . Fatigue 05/24/2018  . Fatty liver disease, nonalcoholic 9/51/8841  . FH: premature coronary heart disease    Father had MI at age 66   . Former smoker 08/29/2016  . Gastroesophageal reflux disease 02/25/2015  . GERD (gastroesophageal reflux disease)   . History of substance abuse ([place])    Pt had abused amphetamines : quit in 2005  . Hypertension 2005  . Impaired fasting  blood sugar 2016  . Keloid scar of skin 06/21/2016  . Lipoma 07/19/2019  . Low testosterone in male 05/24/2018  . Metatarsalgia of right foot 06/21/2016  . Mixed dyslipidemia 09/05/2018  . Nail deformity 07/20/2015  . Need for prophylactic vaccination against Streptococcus pneumoniae (pneumococcus) 12/17/2015  . Need for prophylactic vaccination and inoculation against influenza 12/17/2015  . Onychomycosis   . OSA on CPAP 10/31/2016  . 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   Current Outpatient Medications on File Prior to Visit  Medication Sig Dispense Refill  . amLODipine (NORVASC) 10 MG tablet TAKE ONE TABLET BY MOUTH DAILY 90 tablet 2  . aspirin 81 MG tablet Take 81 mg by mouth daily.    Marland Kitchen lisinopril-hydrochlorothiazide (ZESTORETIC) 20-25 MG tablet TAKE ONE TABLET BY MOUTH DAILY 90 tablet 2  . metoprolol succinate (TOPROL-XL) 25 MG 24 hr tablet TAKE ONE TABLET BY MOUTH DAILY 90 tablet 3  . omega-3 acid ethyl esters (LOVAZA) 1 g capsule Take 1 capsule (1 g total) by mouth 2 (two) times daily. 180 capsule 3  . pantoprazole (PROTONIX) 40 MG tablet TAKE ONE TABLET BY MOUTH DAILY 90 tablet 3  . rosuvastatin (CRESTOR) 20 MG tablet TAKE ONE TABLET BY MOUTH DAILY 90 tablet 2  . tadalafil (CIALIS) 5 MG tablet TAKE ONE TABLET BY MOUTH DAILY AS NEEDED FOR ERECTILE DYSFUNCTION 90 tablet 0  . Testosterone 20 % CREA 3 Doses by  Does not apply route daily. 100 g 5  . nitroGLYCERIN (NITROSTAT) 0.4 MG SL tablet Place 1 tablet (0.4 mg total) under the tongue every 5 (five) minutes as needed for chest pain. (Patient not taking: No sig reported) 90 tablet 3  . triamcinolone cream (KENALOG) 0.1 % Apply 1 application topically 2 (two) times daily. (Patient not taking: No sig reported) 30 g 1   No current facility-administered medications on file prior to visit.   Family History  Problem Relation Age of Onset  . Heart failure Father   . Heart disease Father 29       died of MI  . Diabetes Father   . Heart disease Mother         CAD, stents  . Diabetes Mother   . HIV Sister   . Cancer Maternal Aunt        lung, smoker  . Stroke Neg Hx      The following portions of the patient's history were reviewed and updated as appropriate: allergies, current medications, past family history, past medical history, past social history, past surgical history and problem list.  ROS Otherwise as in subjective above   Objective: BP 130/80   Pulse 75   Ht 5\' 10"  (1.778 m)   Wt 207 lb 9.6 oz (94.2 kg)   SpO2 96%   BMI 29.79 kg/m   Wt Readings from Last 3 Encounters:  07/09/20 207 lb 9.6 oz (94.2 kg)  04/03/20 207 lb (93.9 kg)  02/11/20 209 lb (94.8 kg)   General appearance: alert, no distress, well developed, well nourished Neck: supple, no lymphadenopathy, no thyromegaly, no masses, no bruits Heart: RRR, normal S1, S2, no murmurs Lungs: CTA bilaterally, no wheezes, rhonchi, or rales Pulses: 2+ radial pulses, 2+ pedal pulses, normal cap refill Ext: no edema Ears with normal canal, no cerumen, TMs flat bilat, no erythema   Assessment: Encounter Diagnoses  Name Primary?  . Hypogonadism in male Yes  . Low testosterone in male   . Colon cancer screening   . Erectile dysfunction, unspecified erectile dysfunction type   . Coronary artery disease involving native coronary artery of native heart without angina pectoris   . Primary hypertension   . Impaired fasting blood sugar   . OSA on CPAP   . S/P CABG x 3   . Vaccine counseling   . High risk medication use   . Screening for prostate cancer   . Need for pneumococcal vaccination   . Anxiety and depression   . Tinnitus of both ears      Plan: Low testosterone, hypogonadism - labs today, c/t cream [facility]  OSA - compliant with CPAP, referral back to home health for supplies  HTN - continue current medications  CAD, hyperlipidemia - c/t statin, BP medication, aspirin  Impaired fasting glucose - eat a healthy low sugar  diet, get exercise regular.  Diabetes lab screen today  Anxiety and depression - restart Wellbutrin daily  Tinnitus /ringing in ears - hearing screen ok.  Consider consult with ENT.   Vaccines: Declines covid vaccine  Shingles vaccine:  I recommend you have a shingles vaccine to help prevent shingles or herpes zoster outbreak.   Please call your insurer to inquire about coverage for the Shingrix vaccine given in 2 doses.   Some insurers cover this vaccine after age 16, some cover this after age 36.  If your insurer covers this, then call to schedule appointment to have this  vaccine here.  I recommend pneumococcal 23 vaccine  Counseled on the pneumococcal vaccine.  Vaccine information sheet given.  Pneumococcal vaccine PPSV23 given after consent obtained.   Jethro was seen today for follow-up.  Diagnoses and all orders for this visit:  Hypogonadism in male -     Testosterone  Low testosterone in male -     Testosterone  Colon cancer screening -     Cologuard  Erectile dysfunction, unspecified erectile dysfunction type  Coronary artery disease involving native coronary artery of native heart without angina pectoris  Primary hypertension  Impaired fasting blood sugar -     Hemoglobin A1c  OSA on CPAP  S/P CABG x 3  Vaccine counseling  High risk medication use -     Testosterone -     CBC with Differential/Platelet -     PSA  Screening for prostate cancer -     PSA  Need for pneumococcal vaccination  Anxiety and depression  Tinnitus of both ears  Other orders -     Pneumococcal polysaccharide vaccine 23-valent greater than or equal to 2yo subcutaneous/IM -     buPROPion (WELLBUTRIN XL) 150 MG 24 hr tablet; Take 1 tablet (150 mg total) by mouth daily.    Follow up: pending labs

## 2020-07-09 NOTE — Addendum Note (Signed)
Addended by: Edgar Frisk on: 07/09/2020 02:58 PM   Modules accepted: Orders

## 2020-07-10 LAB — CBC WITH DIFFERENTIAL/PLATELET
Basophils Absolute: 0.1 10*3/uL (ref 0.0–0.2)
Basos: 1 %
EOS (ABSOLUTE): 0.2 10*3/uL (ref 0.0–0.4)
Eos: 3 %
Hematocrit: 48.7 % (ref 37.5–51.0)
Hemoglobin: 15.5 g/dL (ref 13.0–17.7)
Immature Grans (Abs): 0 10*3/uL (ref 0.0–0.1)
Immature Granulocytes: 1 %
Lymphocytes Absolute: 2 10*3/uL (ref 0.7–3.1)
Lymphs: 32 %
MCH: 24.9 pg — ABNORMAL LOW (ref 26.6–33.0)
MCHC: 31.8 g/dL (ref 31.5–35.7)
MCV: 78 fL — ABNORMAL LOW (ref 79–97)
Monocytes Absolute: 0.8 10*3/uL (ref 0.1–0.9)
Monocytes: 13 %
Neutrophils Absolute: 3.3 10*3/uL (ref 1.4–7.0)
Neutrophils: 50 %
Platelets: 311 10*3/uL (ref 150–450)
RBC: 6.23 x10E6/uL — ABNORMAL HIGH (ref 4.14–5.80)
RDW: 17.5 % — ABNORMAL HIGH (ref 11.6–15.4)
WBC: 6.3 10*3/uL (ref 3.4–10.8)

## 2020-07-10 LAB — HEMOGLOBIN A1C
Est. average glucose Bld gHb Est-mCnc: 128 mg/dL
Hgb A1c MFr Bld: 6.1 % — ABNORMAL HIGH (ref 4.8–5.6)

## 2020-07-10 LAB — PSA: Prostate Specific Ag, Serum: 1.1 ng/mL (ref 0.0–4.0)

## 2020-07-10 LAB — TESTOSTERONE: Testosterone: 1239 ng/dL — ABNORMAL HIGH (ref 264–916)

## 2020-07-27 ENCOUNTER — Other Ambulatory Visit: Payer: Self-pay | Admitting: Medical

## 2020-07-30 MED ORDER — ROSUVASTATIN CALCIUM 10 MG PO TABS: 10.0000 mg | ORAL_TABLET | Freq: Every day | ORAL | 3 refills | Status: DC

## 2020-08-05 ENCOUNTER — Ambulatory Visit (INDEPENDENT_AMBULATORY_CARE_PROVIDER_SITE_OTHER): Payer: BC Managed Care – PPO | Admitting: Otolaryngology

## 2020-08-05 ENCOUNTER — Encounter (INDEPENDENT_AMBULATORY_CARE_PROVIDER_SITE_OTHER): Payer: Self-pay | Admitting: Otolaryngology

## 2020-08-05 ENCOUNTER — Other Ambulatory Visit: Payer: Self-pay

## 2020-08-05 VITALS — Temp 97.3°F

## 2020-08-05 DIAGNOSIS — H9313 Tinnitus, bilateral: Secondary | ICD-10-CM

## 2020-08-05 NOTE — Progress Notes (Signed)
HPI: Donald Oliver is a 50 y.o. male who presents is referred by his PCP for evaluation of tinnitus in both ears.  Patient has complained of high-pitched ringing in both ears for the past year.  He has had no pain or drainage from the ears.  He has not noted any hearing problems.  He did not want to have a hearing test performed today the tinnitus seems worse when he first wakes up in the mornings as well as after he takes naps. He denies loud noise exposure.  Does not shoot guns or listen to loud music.  However he does work as a Dealer and is around loud noise occasionally..  Past Medical History:  Diagnosis Date  . Benign prostatic hyperplasia 09/05/2018  . Bilateral high scrotal testes 05/23/2019  . CAD (coronary artery disease), native coronary artery 06/01/2015   Cath 3/3 80% LAD, 99% diag, 50% circ, RCA 100% with Lto R collaterals  CABG 06/02/15 Dr. Cyndia Bent with LIMA to LAD, free RIMA to Diag and SVG to RCA    . Combined hyperlipidemia   . Depressed mood 07/19/2019  . Eczema 09/05/2018  . Elevated LFTs 05/24/2018  . Encounter for health maintenance examination in adult 02/25/2015  . Erectile dysfunction   . Essential hypertension   . Family history of premature CAD   . Fatigue 05/24/2018  . Fatty liver disease, nonalcoholic 9/98/3382  . FH: premature coronary heart disease    Father had MI at age 15   . Former smoker 08/29/2016  . Gastroesophageal reflux disease 02/25/2015  . GERD (gastroesophageal reflux disease)   . History of substance abuse ([place])    Pt had abused amphetamines : quit in 2005  . Hypertension 2005  . Impaired fasting blood sugar 2016  . Keloid scar of skin 06/21/2016  . Lipoma 07/19/2019  . Low testosterone in male 05/24/2018  . Metatarsalgia of right foot 06/21/2016  . Mixed dyslipidemia 09/05/2018  . Nail deformity 07/20/2015  . Need for prophylactic vaccination against Streptococcus pneumoniae (pneumococcus) 12/17/2015  . Need for prophylactic vaccination and  inoculation against influenza 12/17/2015  . Onychomycosis   . OSA on CPAP 10/31/2016  . 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 CARDIAC CATHETERIZATION N/A 05/29/2015   Procedure: Left Heart Cath and Coronary Angiography;  Surgeon: Jettie Booze, MD;  Location: [facility];  Service: Cardiovascular;  Laterality: N/A;  . CARDIAC CATHETERIZATION  05/29/2015   Procedure: Coronary Balloon Angioplasty;  Surgeon: Jettie Booze, MD;  Location: [facility];  Service: Cardiovascular;;  . CORONARY ARTERY BYPASS GRAFT N/A 06/02/2015   Procedure: CORONARY ARTERY BYPASS GRAFTING (CABG) x  3 utilizing bilateral internal mammary artery and endoscopically harvested right sapheneous vein.;  Surgeon: [name], MD;  Location: [facility];  Service: Open Heart Surgery;  Laterality: N/A;  . RHINOPLASTY  [id number]   w/repair of nasal fractuare; done at [facility]   . TEE WITHOUT CARDIOVERSION N/A 06/02/2015   Procedure: TRANSESOPHAGEAL ECHOCARDIOGRAM (TEE);  Surgeon: [name], MD;  Location: [facility];  Service: Open Heart Surgery;  Laterality: N/A;   Social History   Socioeconomic History  . Marital status: Legally Separated    Spouse name: Not on file  . Number of children: 3  . Years of education: 49   . Highest education level: Not on file  Occupational History  . Occupation: Sports coach    Comment: unemployed  Tobacco Use  . Smoking status: Former Smoker    Packs/day: 1.00    Years: 30.00    Pack years: 30.00    Types: Cigarettes  . Smokeless tobacco: Never Used  Vaping Use  . Vaping Use: Some days  . Substances: Nicotine  Substance and Sexual Activity  . Alcohol use: Yes    Alcohol/week: 1.0 standard drink    Types: 1 Standard drinks or equivalent per week    Comment: rarely  . Drug use: Not Currently    Frequency: 1.0 times per week    Types: Marijuana    Comment: 05/29/2015 "none in the last 10 years"  . Sexual  activity: Yes  Other Topics Concern  . 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   06/2019   Social Determinants of Health   Financial Resource Strain: Not on file  Food Insecurity: Not on file  Transportation Needs: Not on file  Physical Activity: Not on file  Stress: Not on file  Social Connections: Not on file   Family History  Problem Relation Age of Onset  . Heart failure Father   . Heart disease Father 20       died of MI  . Diabetes Father   . Heart disease Mother        CAD, stents  . Diabetes Mother   . HIV Sister   . Cancer Maternal Aunt        lung, smoker  . Stroke Neg Hx    Allergies  Allergen Reactions  . Amoxicillin Swelling    Hand swelling - as a teenager Has patient had a PCN reaction causing immediate rash, facial/tongue/throat swelling, SOB or lightheadedness with hypotension: Yes Has patient had a PCN reaction causing severe rash involving mucus membranes or skin necrosis: No Has patient had a PCN reaction that required hospitalization No Has patient had a PCN reaction occurring within the last 10 years: No If all of the above answers are "NO", then may proceed with Cephalosporin use. Hand swelling - as a teenager Has patient had a PCN reaction causing immediate rash, facial/tongue/throat swelling, SOB or lightheadedness with hypotension: Yes Has patient had a PCN reaction causing severe rash involving mucus membranes or skin necrosis: No Has patient had a PCN reaction that required hospitalization No Has patient had a PCN reaction occurring within the last 10 years: No If all of the above answers are "NO", then may proceed with Cephalosporin use.  . Fluoxetine Other (See Comments)    Cloudy mentation, sexual side effects  Cloudy mentation, sexual side effects   Prior to Admission medications    Medication Sig Start Date End Date Taking? Authorizing Provider  tadalafil (CIALIS) 5 MG tablet TAKE ONE TABLET BY MOUTH DAILY AS NEEDED FOR FOR ERECTILE DYSFUNCTION 07/27/20   Tysinger, Camelia Eng, PA-C  amLODipine (NORVASC) 10 MG tablet TAKE ONE TABLET BY MOUTH DAILY 04/20/20   Richardo Priest, MD  aspirin 81 MG tablet Take 81 mg by mouth daily.    [provider]  buPROPion (WELLBUTRIN XL) 150 MG 24 hr tablet Take 1 tablet (150 mg total) by mouth daily. 07/09/20   Tysinger, Camelia Eng, PA-C  lisinopril-hydrochlorothiazide (ZESTORETIC) 20-25 MG tablet TAKE ONE TABLET BY MOUTH DAILY 04/20/20   Richardo Priest, MD  metoprolol succinate (TOPROL-XL) 25 MG 24  hr tablet TAKE ONE TABLET BY MOUTH DAILY 05/01/20   Richardo Priest, MD  nitroGLYCERIN (NITROSTAT) 0.4 MG SL tablet Place 1 tablet (0.4 mg total) under the tongue every 5 (five) minutes as needed for chest pain. Patient not taking: No sig reported 06/01/18 12/03/21  Revankar, Reita Cliche, MD  omega-3 acid ethyl esters (LOVAZA) 1 g capsule Take 1 capsule (1 g total) by mouth 2 (two) times daily. 04/03/20   Richardo Priest, MD  pantoprazole (PROTONIX) 40 MG tablet TAKE ONE TABLET BY MOUTH DAILY 01/16/20   Tysinger, Camelia Eng, PA-C  rosuvastatin (CRESTOR) 10 MG tablet Take 1 tablet (10 mg total) by mouth daily. 07/30/20 10/28/20  Richardo Priest, MD  Testosterone 20 % CREA 3 Doses by Does not apply route daily. 02/10/20   Tysinger, Camelia Eng, PA-C  triamcinolone cream (KENALOG) 0.1 % Apply 1 application topically 2 (two) times daily. Patient not taking: No sig reported 09/05/18   Tysinger, Camelia Eng, PA-C     Positive ROS: Otherwise negative  All other systems have been reviewed and were otherwise negative with the exception of those mentioned in the HPI and as above.  Physical Exam: Constitutional: Alert, well-appearing, no acute distress Ears: External ears without lesions or tenderness. Ear canals are clear bilaterally with no significant wax buildup.  TMs are  clear with good mobility on pneumatic otoscopy.  On hearing screening with the 512 1024 tuning fork he heard well in both ears with no discernible hearing loss subjectively with the 1024 tuning fork.  AC > BC bilaterally. Nasal: External nose without lesions.. Clear nasal passages Oral: Lips and gums without lesions. Tongue and palate mucosa without lesions. Posterior oropharynx clear. Neck: No palpable adenopathy or masses Respiratory: Breathing comfortably  Skin: No facial/neck lesions or rash noted.  Procedures  Assessment: Bilateral tinnitus  Plan: Reviewed with the patient concerning tinnitus and limited treatment options. Discussed with him concerning using masking noise to help with the tinnitus when it is bothersome. Also reviewed with him concerning using ear protection when around loud noise. Gave him samples of Lipo flavonoid to try as this is beneficial in some people with tinnitus. Discussed with him that if tinnitus worsens would recommend proceeding with audiologic testing even though he feels like he hears normal he may have some high-frequency sensorineural hearing loss causing the tinnitus. Reassured him that TMs are normal bilaterally otherwise as he thought this might be related to wax buildup.   Radene Journey, MD   CC:

## 2020-08-16 DIAGNOSIS — Z1211 Encounter for screening for malignant neoplasm of colon: Secondary | ICD-10-CM | POA: Diagnosis not present

## 2020-08-16 LAB — COLOGUARD: Cologuard: NEGATIVE

## 2020-08-19 LAB — COLOGUARD

## 2020-08-25 ENCOUNTER — Other Ambulatory Visit: Payer: Self-pay | Admitting: Medical

## 2020-08-26 LAB — COLOGUARD: COLOGUARD: NEGATIVE

## 2020-08-26 MED ORDER — EC-RX TESTOSTERONE 10 % TD CREA: TOPICAL_CREAM | TRANSDERMAL | 0 refills | Status: DC

## 2020-08-26 NOTE — Addendum Note (Signed)
Addended by: Minette Headland A on: 08/26/2020 03:01 PM   Modules accepted: Orders

## 2020-08-26 NOTE — Telephone Encounter (Signed)
20 % testosterone got sent in instead of 10% and they need a new rx for 10% as that is what patient has been on in the past. Please advsie

## 2020-08-26 NOTE — Addendum Note (Signed)
Addended by: Minette Headland A on: 08/26/2020 10:54 AM   Modules accepted: Orders

## 2020-08-28 ENCOUNTER — Telehealth: Payer: Self-pay | Admitting: Medical

## 2020-08-28 NOTE — Telephone Encounter (Signed)
Please let them know that the Cologuard screening for colon cancer was negative.  This indicates a lower likelihood that colorectal cancer is present.   Lets plan to repeat this in 3 years.  However, if the develop bowel changes, blood in stool, unexpected weight loss, or other new bowel changes, then recheck. 

## 2020-08-28 NOTE — Telephone Encounter (Signed)
Called LM for pt. That cologuard was negative.

## 2020-08-28 NOTE — Progress Notes (Unsigned)
LVM advising pt to call back for results of negative cologuard. [place]

## 2020-08-31 ENCOUNTER — Encounter: Payer: Self-pay | Admitting: Medical

## 2020-10-07 ENCOUNTER — Other Ambulatory Visit: Payer: Self-pay | Admitting: Medical

## 2020-10-07 MED ORDER — EC-RX TESTOSTERONE 10 % TD CREA: TOPICAL_CREAM | TRANSDERMAL | 2 refills | Status: DC

## 2020-10-08 ENCOUNTER — Encounter: Payer: Self-pay | Admitting: Medical

## 2020-10-08 ENCOUNTER — Ambulatory Visit (INDEPENDENT_AMBULATORY_CARE_PROVIDER_SITE_OTHER): Payer: BC Managed Care – PPO | Admitting: Medical

## 2020-10-08 ENCOUNTER — Other Ambulatory Visit: Payer: Self-pay

## 2020-10-08 VITALS — BP 128/78 | HR 74 | Temp 97.6°F | Ht 68.0 in | Wt 194.8 lb

## 2020-10-08 DIAGNOSIS — L309 Dermatitis, unspecified: Secondary | ICD-10-CM

## 2020-10-08 DIAGNOSIS — E782 Mixed hyperlipidemia: Secondary | ICD-10-CM

## 2020-10-08 DIAGNOSIS — E291 Testicular hypofunction: Secondary | ICD-10-CM | POA: Diagnosis not present

## 2020-10-08 DIAGNOSIS — Z8249 Family history of ischemic heart disease and other diseases of the circulatory system: Secondary | ICD-10-CM | POA: Diagnosis not present

## 2020-10-08 DIAGNOSIS — Z0000 Encounter for general adult medical examination without abnormal findings: Secondary | ICD-10-CM | POA: Diagnosis not present

## 2020-10-08 DIAGNOSIS — L989 Disorder of the skin and subcutaneous tissue, unspecified: Secondary | ICD-10-CM | POA: Insufficient documentation

## 2020-10-08 DIAGNOSIS — G4700 Insomnia, unspecified: Secondary | ICD-10-CM

## 2020-10-08 DIAGNOSIS — Z951 Presence of aortocoronary bypass graft: Secondary | ICD-10-CM

## 2020-10-08 DIAGNOSIS — L608 Other nail disorders: Secondary | ICD-10-CM

## 2020-10-08 DIAGNOSIS — Z7185 Encounter for immunization safety counseling: Secondary | ICD-10-CM

## 2020-10-08 DIAGNOSIS — K760 Fatty (change of) liver, not elsewhere classified: Secondary | ICD-10-CM | POA: Diagnosis not present

## 2020-10-08 DIAGNOSIS — B351 Tinea unguium: Secondary | ICD-10-CM

## 2020-10-08 DIAGNOSIS — R7989 Other specified abnormal findings of blood chemistry: Secondary | ICD-10-CM

## 2020-10-08 DIAGNOSIS — Z9989 Dependence on other enabling machines and devices: Secondary | ICD-10-CM

## 2020-10-08 DIAGNOSIS — I2510 Atherosclerotic heart disease of native coronary artery without angina pectoris: Secondary | ICD-10-CM

## 2020-10-08 DIAGNOSIS — G4733 Obstructive sleep apnea (adult) (pediatric): Secondary | ICD-10-CM

## 2020-10-08 DIAGNOSIS — L910 Hypertrophic scar: Secondary | ICD-10-CM

## 2020-10-08 DIAGNOSIS — F419 Anxiety disorder, unspecified: Secondary | ICD-10-CM

## 2020-10-08 DIAGNOSIS — H9313 Tinnitus, bilateral: Secondary | ICD-10-CM

## 2020-10-08 DIAGNOSIS — Z79899 Other long term (current) drug therapy: Secondary | ICD-10-CM

## 2020-10-08 DIAGNOSIS — F32A Depression, unspecified: Secondary | ICD-10-CM

## 2020-10-08 DIAGNOSIS — R7301 Impaired fasting glucose: Secondary | ICD-10-CM | POA: Diagnosis not present

## 2020-10-08 DIAGNOSIS — K219 Gastro-esophageal reflux disease without esophagitis: Secondary | ICD-10-CM

## 2020-10-08 DIAGNOSIS — I10 Essential (primary) hypertension: Secondary | ICD-10-CM | POA: Diagnosis not present

## 2020-10-08 DIAGNOSIS — N400 Enlarged prostate without lower urinary tract symptoms: Secondary | ICD-10-CM

## 2020-10-08 DIAGNOSIS — Q5323 Bilateral high scrotal testes: Secondary | ICD-10-CM

## 2020-10-08 DIAGNOSIS — Z87891 Personal history of nicotine dependence: Secondary | ICD-10-CM

## 2020-10-08 HISTORY — DX: Insomnia, unspecified: G47.00

## 2020-10-08 HISTORY — DX: Anxiety disorder, unspecified: F41.9

## 2020-10-08 HISTORY — DX: Other specified abnormal findings of blood chemistry: R79.89

## 2020-10-08 LAB — URINALYSIS
Bilirubin, UA: NEGATIVE
Glucose, UA: NEGATIVE
Ketones, UA: NEGATIVE
Leukocytes,UA: NEGATIVE
Nitrite, UA: NEGATIVE
Protein,UA: NEGATIVE
RBC, UA: NEGATIVE
Specific Gravity, UA: 1.018 (ref 1.005–1.030)
Urobilinogen, Ur: 1 mg/dL (ref 0.2–1.0)
pH, UA: 7.5 (ref 5.0–7.5)

## 2020-10-08 MED ORDER — TERBINAFINE HCL 250 MG PO TABS: 250.0000 mg | ORAL_TABLET | Freq: Every day | ORAL | 0 refills | Status: DC

## 2020-10-08 NOTE — Progress Notes (Signed)
Subjective:   HPI  Donald Oliver is a 50 y.o. male who presents for Chief Complaint  Patient presents with   Annual Exam    Fasting     Patient Care Team: Bonetta Mostek, Leward Quan as PCP - General (Family Medicine) Sees dentist Sees eye doctor Dr. Shirlee More, cardiology Dr. Ila Mcgill, podiatry Dr. Melony Overly, ENT   Concerns: Last visit he cut down on testosterone from 8 pumps per day to 4 pumps per day given the level was too high.  He does feel better on this dose  Last visit we recommended Wellbutrin to help with mood but he did not start this.  He felt that he did not want to take a daily medication  OSA-he is compliant with CPAP but needs a new CPAP and supplies.  His machine is several years old now  He is compliant with medications  No other complaint  Reviewed their medical, surgical, family, social, medication, and allergy history and updated chart as appropriate.  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 Cyndia Bent with LIMA to LAD, free RIMA to Diag and SVG to RCA     Combined hyperlipidemia    Depressed mood 07/19/2019   Eczema 09/05/2018   Elevated LFTs 05/24/2018   Encounter for health maintenance examination in adult 02/25/2015   Erectile dysfunction    Essential hypertension    Family history of premature CAD    Fatigue 05/24/2018   Fatty liver disease, nonalcoholic 0/34/7425   FH: premature coronary heart disease    Father had MI at age 38    Former smoker 08/29/2016   Gastroesophageal reflux disease 02/25/2015   GERD (gastroesophageal reflux disease)    History of substance abuse ([place])    Pt had abused amphetamines : quit in 2005   Hypertension 2005   Impaired fasting blood sugar 2016   Keloid scar of skin  06/21/2016   Lipoma 07/19/2019   Low testosterone in male 05/24/2018   Metatarsalgia of right foot 06/21/2016   Mixed dyslipidemia 09/05/2018   Nail deformity 07/20/2015   Need for prophylactic vaccination against Streptococcus pneumoniae (pneumococcus) 12/17/2015   Need for prophylactic vaccination and inoculation against influenza 12/17/2015   Onychomycosis    OSA on CPAP 10/31/2016   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    Past Surgical History:  Procedure Laterality Date   CARDIAC CATHETERIZATION N/A 05/29/2015   Procedure: Left Heart Cath and Coronary Angiography;  Surgeon: Jettie Booze, MD;  Location: [facility];  Service: Cardiovascular;  Laterality: N/A;   CARDIAC CATHETERIZATION  05/29/2015   Procedure: Coronary Balloon Angioplasty;  Surgeon: Jettie Booze, MD;  Location: [facility];  Service: Cardiovascular;;   CORONARY ARTERY BYPASS GRAFT N/A 06/02/2015   Procedure: CORONARY ARTERY BYPASS GRAFTING (CABG) x  3 utilizing bilateral internal mammary artery and endoscopically harvested right sapheneous vein.;  Surgeon: [name], MD;  Location: [facility];  Service: Open Heart Surgery;  Laterality: N/A;   RHINOPLASTY  20065   w/repair of nasal fractuare; done at [facility] 06/02/2015   Procedure: TRANSESOPHAGEAL ECHOCARDIOGRAM (TEE);  Surgeon: [name], MD;  Location: [facility];  Service: Open Heart Surgery;  Laterality:  N/A;    Family History  Problem Relation Age of Onset   Heart failure Father    Heart disease Father 9       died of MI   Diabetes Father    Heart disease Mother        CAD, stents   Diabetes Mother    HIV Sister    Cancer Maternal Aunt        lung, smoker   Stroke Neg Hx      Current Outpatient Medications:    amLODipine (NORVASC) 10 MG tablet, TAKE ONE TABLET BY MOUTH DAILY, Disp: 90 tablet, Rfl: 2   aspirin 81 MG tablet, Take 81 mg by mouth daily., Disp: , Rfl:    EC-RX Testosterone 10 % CREA, Apply 37ml (8  clicks) daily & rub in as desire. Compounded Testosterone 10%, Disp: 60 g, Rfl: 2   lisinopril-hydrochlorothiazide (ZESTORETIC) 20-25 MG tablet, TAKE ONE TABLET BY MOUTH DAILY, Disp: 90 tablet, Rfl: 2   metoprolol succinate (TOPROL-XL) 25 MG 24 hr tablet, TAKE ONE TABLET BY MOUTH DAILY, Disp: 90 tablet, Rfl: 3   omega-3 acid ethyl esters (LOVAZA) 1 g capsule, Take 1 capsule (1 g total) by mouth 2 (two) times daily., Disp: 180 capsule, Rfl: 3   pantoprazole (PROTONIX) 40 MG tablet, TAKE ONE TABLET BY MOUTH DAILY, Disp: 90 tablet, Rfl: 3   rosuvastatin (CRESTOR) 10 MG tablet, Take 1 tablet (10 mg total) by mouth daily., Disp: 90 tablet, Rfl: 3   tadalafil (CIALIS) 5 MG tablet, TAKE ONE TABLET BY MOUTH DAILY AS NEEDED FOR FOR ERECTILE DYSFUNCTION, Disp: 90 tablet, Rfl: 0   terbinafine (LAMISIL) 250 MG tablet, Take 1 tablet (250 mg total) by mouth daily., Disp: 45 tablet, Rfl: 0   buPROPion (WELLBUTRIN XL) 150 MG 24 hr tablet, Take 1 tablet (150 mg total) by mouth daily., Disp: 30 tablet, Rfl: 2   nitroGLYCERIN (NITROSTAT) 0.4 MG SL tablet, Place 1 tablet (0.4 mg total) under the tongue every 5 (five) minutes as needed for chest pain. (Patient not taking: No sig reported), Disp: 90 tablet, Rfl: 3   triamcinolone cream (KENALOG) 0.1 %, Apply 1 application topically 2 (two) times daily. (Patient not taking: No sig reported), Disp: 30 g, Rfl: 1  Allergies  Allergen Reactions   Amoxicillin Swelling    Hand swelling - as a teenager Has patient had a PCN reaction causing immediate rash, facial/tongue/throat swelling, SOB or lightheadedness with hypotension: Yes Has patient had a PCN reaction causing severe rash involving mucus membranes or skin necrosis: No Has patient had a PCN reaction that required hospitalization No Has patient had a PCN reaction occurring within the last 10 years: No If all of the above answers are "NO", then may proceed with Cephalosporin use. Hand swelling - as a teenager Has  patient had a PCN reaction causing immediate rash, facial/tongue/throat swelling, SOB or lightheadedness with hypotension: Yes Has patient had a PCN reaction causing severe rash involving mucus membranes or skin necrosis: No Has patient had a PCN reaction that required hospitalization No Has patient had a PCN reaction occurring within the last 10 years: No If all of the above answers are "NO", then may proceed with Cephalosporin use.   Fluoxetine Other (See Comments)    Cloudy mentation, sexual side effects  Cloudy mentation, sexual side effects     Review of Systems Constitutional: -fever, -chills, -sweats, -unexpected weight change, -decreased appetite, -fatigue Allergy: -sneezing, -itching, -congestion Dermatology: -changing moles, --rash, -lumps ENT: -runny  nose, -ear pain, -sore throat, -hoarseness, -sinus pain, -teeth pain, - ringing in ears, -hearing loss, -nosebleeds Cardiology: -chest pain, -palpitations, -swelling, -difficulty breathing when lying flat, -waking up short of breath Respiratory: -cough, -shortness of breath, -difficulty breathing with exercise or exertion, -wheezing, -coughing up blood Gastroenterology: -abdominal pain, -nausea, -vomiting, -diarrhea, -constipation, -blood in stool, -changes in bowel movement, -difficulty swallowing or eating Hematology: -bleeding, -bruising  Musculoskeletal: -joint aches, -muscle aches, -joint swelling, -back pain, -neck pain, -cramping, -changes in gait Ophthalmology: denies vision changes, eye redness, itching, discharge Urology: -burning with urination, -difficulty urinating, -blood in urine, -urinary frequency, -urgency, -incontinence Neurology: -headache, -weakness, -tingling, -numbness, -memory loss, -falls, -dizziness Psychology: -depressed mood, -agitation, +sleep problems Male GU: no testicular mass, pain, no lymph nodes swollen, no swelling, no rash.    Depression screen [facility] 2/9 10/08/2020 07/09/2020 07/19/2019 09/05/2018  05/24/2018  Decreased Interest 1 2 0 0 3  Down, Depressed, Hopeless 1 3 0 0 -  PHQ - 2 Score 2 5 0 0 3  Altered sleeping 2 2 - - 0  Tired, decreased energy 1 2 - - 0  Change in appetite 0 2 - - 0  Feeling bad or failure about yourself  1 2 - - 3  Trouble concentrating 0 2 - - 3  Moving slowly or fidgety/restless 0 1 - - -  Suicidal thoughts 0 0 - - -  PHQ-9 Score 6 16 - - 9  Difficult doing work/chores Somewhat difficult Somewhat difficult - - -        Objective:  BP 128/78   Pulse 74   Temp 97.6 F (36.4 C)   Ht 5\' 8"  (1.727 m)   Wt 194 lb 12.8 oz (88.4 kg)   SpO2 97%   BMI 29.62 kg/m   Wt Readings from Last 3 Encounters:  10/08/20 194 lb 12.8 oz (88.4 kg)  07/09/20 207 lb 9.6 oz (94.2 kg)  04/03/20 207 lb (93.9 kg)   BP Readings from Last 3 Encounters:  10/08/20 128/78  07/09/20 130/80  04/03/20 130/80    General appearance: alert, no distress, WD/WN, Caucasian male Skin: left upper back with 60mm round raised verrucal appearing lesion and surrounding erythema from his self excoriations HEENT: normocephalic, conjunctiva/corneas normal, sclerae anicteric, PERRLA, EOMi Neck: supple, no lymphadenopathy, no thyromegaly, no masses, normal ROM, no bruits Chest: non tender, normal shape and expansion Heart: RRR, normal S1, S2, no murmurs Lungs: vertical CABG scar with keloid, CTA bilaterally, no wheezes, rhonchi, or rales Abdomen: +bs, soft, non tender, non distended, no masses, no hepatomegaly, no splenomegaly, no bruits Back: non tender, normal ROM, no scoliosis Musculoskeletal: upper extremities non tender, no obvious deformity, normal ROM throughout, lower extremities non tender, no obvious deformity, normal ROM throughout Extremities: no edema, no cyanosis, no clubbing Pulses: 2+ symmetric, upper and lower extremities, normal cap refill Neurological: alert, oriented x 3, CN2-12 intact, strength normal upper extremities and lower extremities, sensation normal  throughout, DTRs 2+ throughout, no cerebellar signs, gait normal Psychiatric: normal affect, behavior normal, pleasant     Assessment and Plan :   Encounter Diagnoses  Name Primary?   Encounter for health maintenance examination in adult Yes   Essential hypertension    Family history of premature CAD    Fatty liver disease, nonalcoholic    Gastroesophageal reflux disease, unspecified whether esophagitis present    High risk medication use    Hypogonadism in male    Impaired fasting blood sugar    Vaccine counseling  Tinnitus of both ears    S/P CABG x 3    OSA on CPAP    Onychomycosis    Nail deformity    Mixed dyslipidemia    Low testosterone in male    Keloid scar of skin    Former smoker    Anxiety and depression    Benign prostatic hyperplasia, unspecified whether lower urinary tract symptoms present    Coronary artery disease involving native coronary artery of native heart without angina pectoris    Eczema, unspecified type    Bilateral high scrotal testes    Insomnia, unspecified type    Skin lesion    Abnormal CBC    Anxiety     This visit was a preventative care visit, also known as wellness visit or routine physical.   Topics typically include healthy lifestyle, diet, exercise, preventative care, vaccinations, sick and well care, proper use of emergency dept and after hours care, as well as other concerns.     Recommendations: Continue to return yearly for your annual wellness and preventative care visits.  This gives [place] a chance to discuss healthy lifestyle, exercise, vaccinations, review your chart record, and perform screenings where appropriate.  I recommend you see your eye doctor yearly for routine vision care.  I recommend you see your dentist yearly for routine dental care including hygiene visits twice yearly.   Vaccination recommendations were reviewed Immunization History  Administered Date(s) Administered   Influenza Split 01/28/2015    Influenza,inj,Quad PF,6+ Mos 12/17/2015   Influenza-Unspecified 01/28/2015, 02/12/2017   Pneumococcal Polysaccharide-23 12/17/2015, 07/09/2020   Tdap 04/01/2015    Shingles vaccine:  I recommend you have a shingles vaccine to help prevent shingles or herpes zoster outbreak.   Please call your insurer to inquire about coverage for the Shingrix vaccine given in 2 doses.   Some insurers cover this vaccine after age 52, some cover this after age 44.  If your insurer covers this, then call to schedule appointment to have this vaccine here.  I recommend a yearly flu shot in the fall  You decline Covid vaccine.    Screening for cancer: Colon cancer screening: I reviewed your Cologuard from recent that was negative . Plan to repeat this in 3 years.    We discussed PSA, prostate exam, and prostate cancer screening risks/benefits.     Skin cancer screening: Check your skin regularly for new changes, growing lesions, or other lesions of concern Come in for evaluation if you have skin lesions of concern.  Lung cancer screening: If you have a greater than 20 pack year history of tobacco use, then you may qualify for lung cancer screening with a chest CT scan.   Please call your insurance company to inquire about coverage for this test.  We currently don't have screenings for other cancers besides breast, cervical, colon, and lung cancers.  If you have a strong family history of cancer or have other cancer screening concerns, please let me know.    Bone health: Get at least 150 minutes of aerobic exercise weekly Get weight bearing exercise at least once weekly Bone density test:  A bone density test is an imaging test that uses a type of X-ray to measure the amount of calcium and other minerals in your bones. The test may be used to diagnose or screen you for a condition that causes weak or thin bones (osteoporosis), predict your risk for a broken bone (fracture), or determine how well your  osteoporosis treatment is  working. The bone density test is recommended for females 28 and older, or females or males <92 if certain risk factors such as thyroid disease, long term use of steroids such as for asthma or rheumatological issues, vitamin D deficiency, estrogen deficiency, family history of osteoporosis, self or family history of fragility fracture in first degree relative.    Heart health: Get at least 150 minutes of aerobic exercise weekly Limit alcohol It is important to maintain a healthy blood pressure and healthy cholesterol numbers  Continue to see your cardiologist regularly   Medical care options: I recommend you continue to seek care here first for routine care.  We try really hard to have available appointments Monday through Friday daytime hours for sick visits, acute visits, and physicals.  Urgent care should be used for after hours and weekends for significant issues that cannot wait till the next day.  The emergency department should be used for significant potentially life-threatening emergencies.  The emergency department is expensive, can often have long wait times for less significant concerns, so try to utilize primary care, urgent care, or telemedicine when possible to avoid unnecessary trips to the emergency department.  Virtual visits and telemedicine have been introduced since the pandemic started in 2020, and can be convenient ways to receive medical care.  We offer virtual appointments as well to assist you in a variety of options to seek medical care.    Separate significant issues discussed: I congratulated him on his recent weight loss  Continue to see cardiology regularly.  History of heart disease, CABG, hypertension  Mixed dyslipidemia-since January he has continued on statin added over-the-counter fish oil supplement.  Update labs today  Low testosterone-last visit we decreased the dose given he was over therapeutic range.  He feels better on this  dose.  Recheck labs today  Abnormal red cells last visit likely due to testosterone therapy.  Recheck labs today  Skin lesion upper left back mass return at his convenience for excision.  Caution with keloid potential  Sleep apnea-we will refer to home health for updated CPAP and supplies  Onychomycosis-begin back on trial of Lamisil oral.  Discussed risks/benefits of medication  Insomnia-consider options for therapy.  Continue working on sleep hygiene  Depressed mood-he did not want to start Wellbutrin last visit.  He does not want to take a daily medication.  Sometimes he feels like he could benefit something for anxiety  Tinnitus-he saw ENT for this in May.  Notes and recommendations reviewed.    Deborah was seen today for annual exam.  Diagnoses and all orders for this visit:  Encounter for health maintenance examination in adult -     Hemoglobin A1c -     CBC -     Testosterone -     Lipid panel -     Urinalysis  Essential hypertension -     Urinalysis  Family history of premature CAD  Fatty liver disease, nonalcoholic  Gastroesophageal reflux disease, unspecified whether esophagitis present  High risk medication use  Hypogonadism in male -     Testosterone  Impaired fasting blood sugar -     Hemoglobin A1c  Vaccine counseling  Tinnitus of both ears  S/P CABG x 3  OSA on CPAP  Onychomycosis  Nail deformity  Mixed dyslipidemia -     Lipid panel  Low testosterone in male -     Testosterone  Keloid scar of skin  Former smoker  Anxiety and depression  Benign prostatic  hyperplasia, unspecified whether lower urinary tract symptoms present  Coronary artery disease involving native coronary artery of native heart without angina pectoris  Eczema, unspecified type  Bilateral high scrotal testes  Insomnia, unspecified type  Skin lesion  Abnormal CBC  Anxiety  Other orders -     terbinafine (LAMISIL) 250 MG tablet; Take 1 tablet (250 mg  total) by mouth daily.   Follow-up pending labs, yearly for physical

## 2020-10-08 NOTE — Patient Instructions (Signed)
DENTIST RECOMMENDATION Dr. Jonna Coup, dentist [address] [place], [place] [address] [phone] [email or web address]

## 2020-10-09 ENCOUNTER — Other Ambulatory Visit: Payer: Self-pay | Admitting: Medical

## 2020-10-09 LAB — HEMOGLOBIN A1C
Est. average glucose Bld gHb Est-mCnc: 123 mg/dL
Hgb A1c MFr Bld: 5.9 % — ABNORMAL HIGH (ref 4.8–5.6)

## 2020-10-09 LAB — CBC
Hematocrit: 49 % (ref 37.5–51.0)
Hemoglobin: 15.5 g/dL (ref 13.0–17.7)
MCH: 25.2 pg — ABNORMAL LOW (ref 26.6–33.0)
MCHC: 31.6 g/dL (ref 31.5–35.7)
MCV: 80 fL (ref 79–97)
Platelets: 334 10*3/uL (ref 150–450)
RBC: 6.15 x10E6/uL — ABNORMAL HIGH (ref 4.14–5.80)
RDW: 18.4 % — ABNORMAL HIGH (ref 11.6–15.4)
WBC: 8.2 10*3/uL (ref 3.4–10.8)

## 2020-10-09 LAB — LIPID PANEL
Chol/HDL Ratio: 3.5 ratio (ref 0.0–5.0)
Cholesterol, Total: 148 mg/dL (ref 100–199)
HDL: 42 mg/dL (ref >39)
LDL Chol Calc (NIH): 79 mg/dL (ref 0–99)
Triglycerides: 155 mg/dL — ABNORMAL HIGH (ref 0–149)
VLDL Cholesterol Cal: 27 mg/dL (ref 5–40)

## 2020-10-09 LAB — TESTOSTERONE: Testosterone: 412 ng/dL (ref 264–916)

## 2020-10-09 MED ORDER — ASPIRIN EC 81 MG PO TBEC: 81.0000 mg | DELAYED_RELEASE_TABLET | Freq: Every day | ORAL | 3 refills | Status: DC

## 2020-10-09 MED ORDER — TRAZODONE HCL 50 MG PO TABS: 25.0000 mg | ORAL_TABLET | Freq: Every day | ORAL | 2 refills | Status: DC

## 2020-10-09 MED ORDER — ROSUVASTATIN CALCIUM 20 MG PO TABS: 20.0000 mg | ORAL_TABLET | Freq: Every day | ORAL | 3 refills | Status: DC

## 2020-10-25 ENCOUNTER — Other Ambulatory Visit: Payer: Self-pay | Admitting: Medical

## 2020-11-03 ENCOUNTER — Ambulatory Visit: Payer: BC Managed Care – PPO | Admitting: Medical

## 2020-11-03 ENCOUNTER — Other Ambulatory Visit: Payer: Self-pay

## 2020-11-03 VITALS — BP 120/80 | HR 67 | Wt 192.8 lb

## 2020-11-03 DIAGNOSIS — L989 Disorder of the skin and subcutaneous tissue, unspecified: Secondary | ICD-10-CM | POA: Diagnosis not present

## 2020-11-03 DIAGNOSIS — L570 Actinic keratosis: Secondary | ICD-10-CM

## 2020-11-03 DIAGNOSIS — B079 Viral wart, unspecified: Secondary | ICD-10-CM

## 2020-11-03 NOTE — Patient Instructions (Signed)
Cryosurgery for Skin Conditions Cryosurgery is the use of a very cold liquid (liquid nitrogen) to treat abnormal or diseased tissue. This treatment is also called cryotherapy. It can freeze or take away growths on skin, such as: Warts. Skin sores that could become cancer. Some skin cancers. This treatment normally takes a few minutes. It can be done in your doctor'soffice. Tell a doctor about: Any allergies you have. All medicines you are taking, including vitamins, herbs, eye drops, creams, and over-the-counter medicines. Any problems you or family members have had with medicines that make you fall asleep (anesthetic medicines). Any blood disorders you have. Any surgeries you have had. Any medical conditions you have. Whether you are pregnant or may be pregnant. What are the risks? Generally, this is a safe treatment. However, problems may occur, including: Infection. Bleeding. Scarring. Changes in skin color (lighter or darker than normal skin tone). Swelling. Hair loss in the treated area. Damage to nearby parts or organs, such as nerve damage and loss of feeling. This is rare. What happens before the procedure? You do not have to do anything to get ready for this treatment. Your doctor will talk with you about: The treatment. The benefits and risks. What happens during the procedure?  Your treatment will be done in one of these two ways: Your doctor may use a tool (probe) on your skin. The tool has very cold liquid in it to cool it down. The tool will be used until the skin is frozen and killed. Your doctor may use a swab or spray to get the very cold liquid onto your skin. Your doctor will keep using the very cold liquid until the skin is frozen and killed. A bandage (dressing) may be put on the area. These procedures may vary among doctors and clinics. What can I expect after the treatment? After your treatment, it is common to have: Redness over the treated area. Swelling  over the treated area. A blister that forms over the treated area. The blister may have a little blood in it. You may also have some mild stinging or a burning feeling that will go away. If a blister forms, it will break open on its own after about 2-4 weeks. This will leave a scab. Then the treated area will heal. After healing, there is normally little or no scarring. Follow these instructions at home: Caring for the treated area  Follow instructions from your doctor about how to take care of your treated area. If you have a bandage, make sure you: Wash your hands with soap and water for at least 20 seconds before and after you change your bandage. If you cannot use soap and water, use hand sanitizer. Change your bandage as told by your doctor. Keep the bandage and the treated area clean and dry. If the bandage gets wet, change it right away. Clean the treated area with soap and water. Keep the area covered with a bandage until it heals, or for as long as told by your doctor. Check the treated area every day for signs of infection. Check for: More redness, swelling, or pain. More fluid or blood. Warmth. Pus or a bad smell. If you have a blister: Do not pick at it. Doing this can cause infection and scarring. Do not try to break it open. Doing this can cause infection and scarring. Do not put any medicine, cream, or lotion on the treated area unless told by your doctor.  Bathing Until your doctor approves: Do  not take baths. Do not swim. Do not use a hot tub. Do not hand-wash dishes. Do not soak the treated area in other ways. Ask your doctor if you may take showers. You may only be allowed to take sponge baths. General instructions Take over-the-counter and prescription medicines only as told by your doctor. Do not use any products that contain nicotine or tobacco, such as cigarettes, e-cigarettes, and chewing tobacco. These can delay healing. If you need help quitting, ask your  doctor. Keep all follow-up visits as told by your doctor. This is important. Contact a doctor if: You have any of these signs of infection in or around your treated area: More redness. More swelling. More pain. More fluid. More blood. Warmth. Pus or a bad smell. Your blister grows large and causes pain. Get help right away if: You have a fever. You have redness that spreads from the treated area. Summary Cryosurgery uses a very cold liquid to freeze or take away growths on skin. It is also called cryotherapy. Generally, this is a safe treatment. You do not have to do anything to get ready for it. Your doctor will freeze or kill the skin in one of two ways. One way is with a tool (probe) that has the very cold liquid in it. The other way is to spread the very cold liquid onto your skin with a swab or spray. After your treatment, follow care instructions from your doctor. Watch for infection. If you have a blister, do not pick at it or break it open. This information is not intended to replace advice given to you by your health care provider. Make sure you discuss any questions you have with your healthcare provider. Document Revised: 10/31/2018 Document Reviewed: 10/31/2018 Elsevier Patient Education  [place].

## 2020-11-03 NOTE — Progress Notes (Signed)
Subjective:  Donald Oliver is a 50 y.o. male who presents for Chief Complaint  Patient presents with   wart removal    Wart removal on back      Here for excision/destruction of multiple skin lesions we discussed on his recent physical.  No other aggravating or relieving factors.    No other c/o.  The following portions of the patient's history were reviewed and updated as appropriate: allergies, current medications, past family history, past medical history, past social history, past surgical history and problem list.  ROS Otherwise as in subjective above  Objective: BP 120/80   Pulse 67   Wt 192 lb 12.8 oz (87.5 kg)   BMI 29.32 kg/m   General appearance: alert, no distress, well developed, well nourished  Right upper back with a 4 mm diameter slightly raised verruca lesion Mid upper back with a 3 mm diameter crusted pink-red flat lesion suggestive of AK Left upper back with a 3 mm diameter crusted pink-red flat lesion suggestive of AK  Left thumb base dorsally with a 3 mm diameter crusted pink-red flat lesion suggestive of AK Left forearm distal third lateral surface with 3 mm diameter crusted pink-red flat lesion suggestive of AK Right forearm midshaft posterior surface with 3 mm diameter crusted pink-red flat lesion suggestive of AK Right proximal third forearm anteriorly with 2 mm diameter crusted pink-red flat lesion suggestive of AK  Left lateral upper abdomen wall with 3-4 mm diameter raised crusted verruca lesion  Left lateral neck with 3 separate small 2 to 3 mm pedunculated skin tags, right lateral neck with 3 separate 2 to 3 mm pedunculated skin tags, all benign-appearing      Assessment: Encounter Diagnoses  Name Primary?   Skin lesion Yes   Actinic keratoses    Verruca      Plan: We discussed the skin tags around his neck, we discussed the verruca lesions on his upper back and left side, we discussed the other lesions on the arms and back -  actinic  keratosis.  We discussed means of removal which could include cryotherapy, excision, shave biopsy or other.  We discussed risk and benefits of procedure including keloid.  He has a prior small keloid tissue of the anterior chest wall he agrees to cryotherapy for the actinic keratosis and verruca lesions and he agrees to excision of the skin tags around the neck  We cleaned and prepped the skin tags on the left and right neck, use ethyl acetate spray for local anesthesia and used sterile scissors to excise the skin tags listed above, 6 individual skin tags  I use cryotherapy for all of the other lesions  Patient tolerated procedure well  We discussed wound care  [name] was seen today for wart removal.  Diagnoses and all orders for this visit:  Skin lesion  Actinic keratoses  Verruca   Follow up: As needed

## 2021-01-05 ENCOUNTER — Encounter: Payer: Self-pay | Admitting: Cardiology

## 2021-01-05 ENCOUNTER — Ambulatory Visit (INDEPENDENT_AMBULATORY_CARE_PROVIDER_SITE_OTHER): Payer: BC Managed Care – PPO | Admitting: Cardiology

## 2021-01-05 ENCOUNTER — Other Ambulatory Visit: Payer: Self-pay

## 2021-01-05 VITALS — BP 142/80 | HR 84 | Ht 68.0 in | Wt 194.1 lb

## 2021-01-05 DIAGNOSIS — I25110 Atherosclerotic heart disease of native coronary artery with unstable angina pectoris: Secondary | ICD-10-CM | POA: Diagnosis not present

## 2021-01-05 DIAGNOSIS — Z951 Presence of aortocoronary bypass graft: Secondary | ICD-10-CM | POA: Diagnosis not present

## 2021-01-05 DIAGNOSIS — I10 Essential (primary) hypertension: Secondary | ICD-10-CM

## 2021-01-05 DIAGNOSIS — E782 Mixed hyperlipidemia: Secondary | ICD-10-CM | POA: Diagnosis not present

## 2021-01-05 MED ORDER — EZETIMIBE 10 MG PO TABS: 10.0000 mg | ORAL_TABLET | Freq: Every day | ORAL | 1 refills | Status: DC

## 2021-01-05 NOTE — Patient Instructions (Signed)
Medication Instructions:  Your physician has recommended you make the following change in your medication:  START: Zetia 10 mg daily  *If you need a refill on your cardiac medications before your next appointment, please call your pharmacy*   Lab Work: Your physician recommends that you return for lab work in 2 months: lipid   If you have labs (blood work) drawn today and your tests are completely normal, you will receive your results only by: [place] (if you have MyChart) OR A paper copy in the mail If you have any lab test that is abnormal or we need to change your treatment, we will call you to review the results.   Testing/Procedures: None   Follow-Up: At [facility], you and your health needs are our priority.  As part of our continuing mission to provide you with exceptional heart care, we have created designated Provider Care Teams.  These Care Teams include your primary Cardiologist (physician) and Advanced Practice Providers (APPs -  Physician Assistants and Nurse Practitioners) who all work together to provide you with the care you need, when you need it.  We recommend signing up for the patient portal called "MyChart".  Sign up information is provided on this After Visit Summary.  MyChart is used to connect with patients for Virtual Visits (Telemedicine).  Patients are able to view lab/test results, encounter notes, upcoming appointments, etc.  Non-urgent messages can be sent to your provider as well.   To learn more about what you can do with MyChart, go to [email or web address].    Your next appointment:   1 year(s)  The format for your next appointment:   In Person  Provider:   Shirlee More, MD   Other Instructions  Ezetimibe Tablets What is this medication? EZETIMIBE (ez ET i mibe) treats high cholesterol. It works by reducing the amount of cholesterol absorbed from the food you eat. This decreases the amount of bad cholesterol (such as LDL) in your  blood. Changes to diet and exercise are often combined with this medication. This medicine may be used for other purposes; ask your health care provider or pharmacist if you have questions. COMMON BRAND NAME(S): Zetia What should I tell my care team before I take this medication? They need to know if you have any of these conditions: Kidney disease Liver disease Muscle cramps, pain Muscle injury Thyroid disease An unusual or allergic reaction to ezetimibe, other medications, foods, dyes, or preservatives Pregnant or trying to get pregnant Breast-feeding How should I use this medication? Take this medication by mouth. Take it as directed on the prescription label at the same time every day. You can take it with or without food. If it upsets your stomach, take it with food. Keep taking it unless your care team tells you to stop. Take bile acid sequestrants at a different time of day than this medication. Take this medication 2 hours BEFORE or 4 hours AFTER bile acid sequestrants. Talk to your care team about the use of this medication in children. While it may be prescribed for children as young as 10 for selected conditions, precautions do apply. Overdosage: If you think you have taken too much of this medicine contact a poison control center or emergency room at once. NOTE: This medicine is only for you. Do not share this medicine with others. What if I miss a dose? If you miss a dose, take it as soon as you can. If it is almost time for your next dose,  take only that dose. Do not take double or extra doses. What may interact with this medication? Do not take this medication with any of the following: Fenofibrate Gemfibrozil This medication may also interact with the following: Antacids Cyclosporine Herbal medications like red yeast rice Other medications to lower cholesterol or triglycerides This list may not describe all possible interactions. Give your health care provider a list of  all the medicines, herbs, non-prescription drugs, or dietary supplements you use. Also tell them if you smoke, drink alcohol, or use illegal drugs. Some items may interact with your medicine. What should I watch for while using this medication? Visit your care team for regular checks on your progress. Tell your care team if your symptoms do not start to get better or if they get worse. Your care team may tell you to stop taking this medication if you develop muscle problems. If your muscle problems do not go away after stopping this medication, contact your care team. Do not become pregnant while taking this medication. Women should inform their care team if they wish to become pregnant or think they might be pregnant. There is potential for serious harm to an unborn child. Talk to your care team for more information. Do not breast-feed an infant while taking this medication. Taking this medication is only part of a total heart healthy program. Your care team may give you a special diet to follow. Avoid alcohol. Avoid smoking. Ask your care team how much you should exercise. What side effects may I notice from receiving this medication? Side effects that you should report to your doctor or health care provider as soon as possible: Allergic reactions-skin rash, itching or hives, swelling of the face, lips, tongue, or throat Side effects that usually do not require medical attention (report to your doctor or health care provider if they continue or are bothersome): Diarrhea Joint pain This list may not describe all possible side effects. Call your doctor for medical advice about side effects. You may report side effects to FDA at 1-800-FDA-1088. Where should I keep my medication? Keep out of the reach of children and pets. Store at room temperature between 15 and 30 degrees C (59 and 86 degrees F). Protect from moisture. Get rid of any unused medication after the expiration date. NOTE: This sheet is a  summary. It may not cover all possible information. If you have questions about this medicine, talk to your doctor, pharmacist, or health care provider.  2022 Elsevier/Gold Standard (2020-04-10 12:29:13)

## 2021-01-05 NOTE — Addendum Note (Signed)
Addended by: Darius Bump B on: 01/05/2021 04:49 PM   Modules accepted: Orders

## 2021-01-05 NOTE — Progress Notes (Signed)
Cardiology Office Note:    Date:  01/05/2021   ID:  Donald Oliver, DOB 08-08-1970, MRN [id number]  PCP:  Carlena Hurl, PA-C  Cardiologist:  Shirlee More, MD    Referring MD: Carlena Hurl, PA-C    ASSESSMENT:    1. Coronary artery disease involving native coronary artery of native heart with unstable angina pectoris ([place])   2. S/P CABG x 3   3. Essential hypertension   4. Mixed dyslipidemia    PLAN:    In order of problems listed above:  Stable CAD having no angina after CABG on current medical therapy continue aspirin calcium channel blocker high intensity statin fish oil and add Zetia.  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  Concerns regarding medicines are outlined above.  Orders Placed This Encounter  Procedures   Lipid panel    Meds ordered this encounter  Medications   ezetimibe (ZETIA) 10 MG tablet    Sig: Take 1 tablet (10 mg total) by mouth daily.    Dispense:  90 tablet    Refill:  1     Chief Complaint  Patient presents with   Follow-up   Coronary Artery Disease   Hyperlipidemia     History of Present Illness:    Donald Oliver is a 50 y.o. male with a hx of CAD with CABG 2017 hypertension and hyper lipidemia last seen 04/03/2020.  Myocardial perfusion study 02/12/2020 low risk EF 54% normal left ventricular function and no ischemia.  Compliance with diet, lifestyle and medications: Yes  His ambulatory blood pressures consistently less than 130/80. He tolerates lipid-lowering therapy without muscle pain or weakness.  His most recent LDL is above guidelines 79 10/08/2020 and we will add Zetia to  his statin. He is vigorous active and has had no angina dyspnea palpitation or syncope. 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 Cyndia Bent with LIMA to LAD, free RIMA to Diag and SVG to RCA     Combined hyperlipidemia    Depressed mood 07/19/2019   Eczema 09/05/2018   Elevated LFTs 05/24/2018   Encounter for health maintenance examination in adult 02/25/2015   Erectile dysfunction    Essential hypertension    Family history of premature CAD    Fatigue 05/24/2018   Fatty liver disease, nonalcoholic 6/37/8588   FH: premature coronary heart disease    Father had MI at age 62    Former smoker 08/29/2016   Gastroesophageal reflux disease 02/25/2015   GERD (gastroesophageal reflux disease)    History of substance abuse ([place])    Pt had abused amphetamines : quit in 2005   Hypertension 2005   Impaired fasting blood sugar 2016   Keloid scar of skin 06/21/2016   Lipoma 07/19/2019   Low testosterone in male 05/24/2018   Metatarsalgia of right foot 06/21/2016   Mixed dyslipidemia 09/05/2018   Nail deformity 07/20/2015   Need for prophylactic vaccination against Streptococcus pneumoniae (  pneumococcus) 12/17/2015   Need for prophylactic vaccination and inoculation against influenza 12/17/2015   Onychomycosis    OSA on CPAP 10/31/2016   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    Past Surgical History:  Procedure Laterality Date   CARDIAC CATHETERIZATION N/A 05/29/2015   Procedure: Left Heart Cath and Coronary Angiography;  Surgeon: Jettie Booze, MD;  Location: [facility];  Service: Cardiovascular;  Laterality: N/A;   CARDIAC CATHETERIZATION  05/29/2015   Procedure: Coronary Balloon Angioplasty;  Surgeon: Jettie Booze, MD;  Location: [facility];  Service:  Cardiovascular;;   CORONARY ARTERY BYPASS GRAFT N/A 06/02/2015   Procedure: CORONARY ARTERY BYPASS GRAFTING (CABG) x  3 utilizing bilateral internal mammary artery and endoscopically harvested right sapheneous vein.;  Surgeon: [name], MD;  Location: [facility];  Service: Open Heart Surgery;  Laterality: N/A;   RHINOPLASTY  20065   w/repair of nasal fractuare; done at [facility] 06/02/2015   Procedure: TRANSESOPHAGEAL ECHOCARDIOGRAM (TEE);  Surgeon: [name], MD;  Location: [facility];  Service: Open Heart Surgery;  Laterality: N/A;    Current Medications: Current Meds  Medication Sig   amLODipine (NORVASC) 10 MG tablet TAKE ONE TABLET BY MOUTH DAILY   aspirin EC 81 MG tablet Take 1 tablet (81 mg total) by mouth daily.   EC-RX Testosterone 10 % CREA Apply 79ml (8 clicks) daily & rub in as desire. Compounded Testosterone 10%   ezetimibe (ZETIA) 10 MG tablet Take 1 tablet (10 mg total) by mouth daily.   lisinopril-hydrochlorothiazide (ZESTORETIC) 20-25 MG tablet TAKE ONE TABLET BY MOUTH DAILY   omega-3 acid ethyl esters (LOVAZA) 1 g capsule Take 1 capsule (1 g total) by mouth 2 (two) times daily.   pantoprazole (PROTONIX) 40 MG tablet TAKE ONE TABLET BY MOUTH DAILY   rosuvastatin (CRESTOR) 10 MG tablet Take 10 mg by mouth at bedtime.     Allergies:   Amoxicillin and Fluoxetine   Social History   Socioeconomic History   Marital status: Legally Separated    Spouse name: Not on file   Number of children: 3   Years of education: 12    Highest education level: Not on file  Occupational History   Occupation: Sports coach    Comment: unemployed   Tobacco Use   Smoking status: Former    Packs/day: 1.00    Years: 30.00    Pack years: 30.00    Types: Cigarettes   Smokeless tobacco: Never  Vaping Use   Vaping Use: Some days   Substances: Nicotine  Substance and Sexual Activity   Alcohol use: Yes    Alcohol/week: 1.0 standard drink     Types: 1 Standard drinks or equivalent per week    Comment: rarely   Drug use: Not Currently    Frequency: 1.0 times per week    Types: Marijuana    Comment: 05/29/2015 "none in the last 10 years"   Sexual activity: Yes  Other Topics Concern   Not on file  Social History Narrative   Married has 3 girls, 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   06/2019   Social Determinants of Health   Financial Resource Strain: Not on file  Food Insecurity: Not on file  Transportation Needs: Not on file  Physical Activity: Not on file  Stress: Not on file  Social  Connections: Not on file     Family History: The patient's family history includes Cancer in his maternal aunt; Diabetes in his father and mother; HIV in his sister; Heart disease in his mother; Heart disease (age of onset: 59) in his father; Heart failure in his father. There is no history of Stroke. ROS:   Please see the history of present illness.    All other systems reviewed and are negative.  EKGs/Labs/Other Studies Reviewed:    The following studies were reviewed today:  EKG:  EKG ordered today and personally reviewed.  The ekg ordered today demonstrates sinus rhythm old inferior MI otherwise normal  Recent Labs: 04/03/2020: ALT 30; BUN 13; Creatinine, Ser 0.91; Potassium 3.5; Sodium 140 10/08/2020: Hemoglobin 15.5; Platelets 334  Recent Lipid Panel    Component Value Date/Time   CHOL 148 10/08/2020 1031   TRIG 155 (H) 10/08/2020 1031   HDL 42 10/08/2020 1031   CHOLHDL 3.5 10/08/2020 1031   CHOLHDL 5.6 (H) 10/31/2016 1500   VLDL 74 (H) 10/31/2016 1500   LDLCALC 79 10/08/2020 1031    Physical Exam:    VS:  BP (!) 142/80   Pulse 84   Ht 5\' 8"  (1.727 m)   Wt 194 lb 1.9 oz (88.1 kg)   SpO2 98%   BMI 29.52 kg/m     Wt Readings from Last 3 Encounters:  01/05/21 194 lb 1.9 oz (88.1 kg)  11/03/20 192 lb 12.8 oz (87.5 kg)   10/08/20 194 lb 12.8 oz (88.4 kg)     GEN:  Well nourished, well developed in no acute distress HEENT: Normal NECK: No JVD; No carotid bruits LYMPHATICS: No lymphadenopathy CARDIAC: 142/80 RRR, no murmurs, rubs, gallops RESPIRATORY:  Clear to auscultation without rales, wheezing or rhonchi  ABDOMEN: Soft, non-tender, non-distended MUSCULOSKELETAL:  No edema; No deformity  SKIN: Warm and dry NEUROLOGIC:  Alert and oriented x 3 PSYCHIATRIC:  Normal affect    Signed, Shirlee More, MD  01/05/2021 1:44 PM    [facility]

## 2021-01-18 ENCOUNTER — Other Ambulatory Visit: Payer: Self-pay | Admitting: Medical

## 2021-01-18 ENCOUNTER — Other Ambulatory Visit: Payer: Self-pay | Admitting: Cardiology

## 2021-02-05 ENCOUNTER — Other Ambulatory Visit: Payer: Self-pay

## 2021-02-05 ENCOUNTER — Ambulatory Visit: Payer: Self-pay | Admitting: Medical

## 2021-02-05 VITALS — BP 122/80 | HR 81 | Temp 98.7°F | Wt 203.4 lb

## 2021-02-05 DIAGNOSIS — Z202 Contact with and (suspected) exposure to infections with a predominantly sexual mode of transmission: Secondary | ICD-10-CM

## 2021-02-05 MED ORDER — AZITHROMYCIN 500 MG PO TABS: 1000.0000 mg | ORAL_TABLET | Freq: Every day | ORAL | 0 refills | Status: DC

## 2021-02-05 NOTE — Progress Notes (Signed)
Subjective:  Donald Oliver is a 50 y.o. male who presents for Chief Complaint  Patient presents with   chlamydia exposure    Exposure a couple times within the last month. No symptoms     Here for chlamydia exposure.  He reports that his girlfriend of the gynecology recently for routine screening was positive for chlamydia.  He notes no other partners.  He is not aware that she would have any other partners.  He does not know when she last got screened.  He has not had screening in a while but has not had other partners.  He has no current symptoms.  He does not think she has any symptoms either.  She discussed prescribed medications today   No other aggravating or relieving factors.    No other c/o.  The following portions of the patient's history were reviewed and updated as appropriate: allergies, current medications, past family history, past medical history, past social history, past surgical history and problem list.  ROS Otherwise as in subjective above  Objective: BP 122/80   Pulse 81   Temp 98.7 F (37.1 C)   Wt 203 lb 6.4 oz (92.3 kg)   BMI 30.93 kg/m   General appearance: alert, no distress, well developed, well nourished GU: Normal male, circumcised, no rash, no lymphadenopathy, no tenderness or swelling   Assessment: Encounter Diagnoses  Name Primary?   Venereal disease contact Yes   Exposure to chlamydia      Plan: Begin azithromycin for empiric treatment for chlamydia. Labs as below Advised no sexual activity for the next week until both of them are treated and use condoms.  I recommended repeat testing in 3 months  We discussed means of transmission, timeframe to see symptoms, but also discussed that certain STDs may not even have symptoms initially  [name] was seen today for chlamydia exposure.  Diagnoses and all orders for this visit:  Venereal disease contact -     RPR+HIV+GC+CT Panel -     Hepatitis C antibody -     Hepatitis B surface  antigen  Exposure to chlamydia -     RPR+HIV+GC+CT Panel -     Hepatitis C antibody -     Hepatitis B surface antigen  Other orders -     azithromycin (ZITHROMAX) 500 MG tablet; Take 2 tablets (1,000 mg total) by mouth daily.    Follow up: Pending labs

## 2021-02-08 LAB — RPR+HIV+GC+CT PANEL
Chlamydia trachomatis, NAA: NEGATIVE
HIV Screen 4th Generation wRfx: NONREACTIVE
Neisseria Gonorrhoeae by PCR: NEGATIVE
RPR Ser Ql: NONREACTIVE

## 2021-02-08 LAB — HEPATITIS B SURFACE ANTIGEN: Hepatitis B Surface Ag: NEGATIVE

## 2021-02-08 LAB — HEPATITIS C ANTIBODY: Hep C Virus Ab: <0.1 s/co ratio (ref 0.0–0.9)

## 2021-04-15 ENCOUNTER — Other Ambulatory Visit: Payer: Self-pay | Admitting: Cardiology

## 2021-04-15 ENCOUNTER — Other Ambulatory Visit: Payer: Self-pay | Admitting: Medical

## 2021-05-12 ENCOUNTER — Encounter: Payer: Self-pay | Admitting: Internal Medicine

## 2021-05-20 ENCOUNTER — Other Ambulatory Visit: Payer: Self-pay | Admitting: Medical

## 2021-07-10 ENCOUNTER — Other Ambulatory Visit: Payer: Self-pay | Admitting: Cardiology

## 2021-07-19 ENCOUNTER — Other Ambulatory Visit: Payer: Self-pay | Admitting: Cardiology

## 2021-07-19 ENCOUNTER — Other Ambulatory Visit: Payer: Self-pay | Admitting: Medical

## 2021-07-23 ENCOUNTER — Other Ambulatory Visit: Payer: Self-pay | Admitting: Cardiology

## 2021-10-11 ENCOUNTER — Encounter: Payer: BC Managed Care – PPO | Admitting: Medical

## 2021-10-17 ENCOUNTER — Other Ambulatory Visit: Payer: Self-pay | Admitting: Medical

## 2021-10-19 ENCOUNTER — Encounter: Payer: Self-pay | Admitting: Medical

## 2021-10-19 ENCOUNTER — Other Ambulatory Visit: Payer: Self-pay | Admitting: Medical

## 2021-10-19 ENCOUNTER — Ambulatory Visit (INDEPENDENT_AMBULATORY_CARE_PROVIDER_SITE_OTHER): Payer: BC Managed Care – PPO | Admitting: Medical

## 2021-10-19 VITALS — BP 130/80 | HR 70 | Wt 202.8 lb

## 2021-10-19 DIAGNOSIS — R7301 Impaired fasting glucose: Secondary | ICD-10-CM | POA: Diagnosis not present

## 2021-10-19 DIAGNOSIS — I2510 Atherosclerotic heart disease of native coronary artery without angina pectoris: Secondary | ICD-10-CM

## 2021-10-19 DIAGNOSIS — I10 Essential (primary) hypertension: Secondary | ICD-10-CM

## 2021-10-19 DIAGNOSIS — Z0000 Encounter for general adult medical examination without abnormal findings: Secondary | ICD-10-CM | POA: Diagnosis not present

## 2021-10-19 DIAGNOSIS — Z87891 Personal history of nicotine dependence: Secondary | ICD-10-CM

## 2021-10-19 DIAGNOSIS — N400 Enlarged prostate without lower urinary tract symptoms: Secondary | ICD-10-CM

## 2021-10-19 DIAGNOSIS — Z720 Tobacco use: Secondary | ICD-10-CM

## 2021-10-19 DIAGNOSIS — Z125 Encounter for screening for malignant neoplasm of prostate: Secondary | ICD-10-CM | POA: Diagnosis not present

## 2021-10-19 DIAGNOSIS — R7989 Other specified abnormal findings of blood chemistry: Secondary | ICD-10-CM | POA: Diagnosis not present

## 2021-10-19 DIAGNOSIS — Z951 Presence of aortocoronary bypass graft: Secondary | ICD-10-CM

## 2021-10-19 DIAGNOSIS — L910 Hypertrophic scar: Secondary | ICD-10-CM

## 2021-10-19 DIAGNOSIS — G4733 Obstructive sleep apnea (adult) (pediatric): Secondary | ICD-10-CM

## 2021-10-19 DIAGNOSIS — K760 Fatty (change of) liver, not elsewhere classified: Secondary | ICD-10-CM

## 2021-10-19 DIAGNOSIS — E782 Mixed hyperlipidemia: Secondary | ICD-10-CM

## 2021-10-19 DIAGNOSIS — Z9989 Dependence on other enabling machines and devices: Secondary | ICD-10-CM

## 2021-10-19 DIAGNOSIS — E291 Testicular hypofunction: Secondary | ICD-10-CM

## 2021-10-19 DIAGNOSIS — N529 Male erectile dysfunction, unspecified: Secondary | ICD-10-CM

## 2021-10-19 DIAGNOSIS — K219 Gastro-esophageal reflux disease without esophagitis: Secondary | ICD-10-CM

## 2021-10-19 DIAGNOSIS — Z7185 Encounter for immunization safety counseling: Secondary | ICD-10-CM

## 2021-10-19 HISTORY — DX: Tobacco use: Z72.0

## 2021-10-19 MED ORDER — TESTOSTERONE 40.5 MG/2.5GM (1.62%) TD GEL
4.0000 | Freq: Every day | TRANSDERMAL | 1 refills | Status: DC
Start: 2021-10-19 — End: 2021-12-29

## 2021-10-19 MED ORDER — TESTOSTERONE 40.5 MG/2.5GM (1.62%) TD GEL: 4.0000 | Freq: Every day | TRANSDERMAL | 3 refills | Status: DC

## 2021-10-19 MED ORDER — ROSUVASTATIN CALCIUM 10 MG PO TABS: 10.0000 mg | ORAL_TABLET | Freq: Every day | ORAL | 3 refills | Status: DC

## 2021-10-19 MED ORDER — ASPIRIN 81 MG PO TBEC: 81.0000 mg | DELAYED_RELEASE_TABLET | Freq: Every day | ORAL | 3 refills | Status: DC

## 2021-10-19 MED ORDER — VARENICLINE TARTRATE 0.5 MG PO TABS: 0.5000 mg | ORAL_TABLET | Freq: Two times a day (BID) | ORAL | 1 refills | Status: DC

## 2021-10-19 MED ORDER — TADALAFIL 5 MG PO TABS: 5.0000 mg | ORAL_TABLET | Freq: Every day | ORAL | 3 refills | Status: DC | PRN

## 2021-10-19 NOTE — Telephone Encounter (Signed)
Recv'd fax from [facility] regarding Androgel was sent in for 5 gm and is dispensed in 75 gm bottle.  Since pt is on 4 pumps per day should be 150 gms which is 2 bottles at 60 pumps each so I changed Rx,  Audelia Acton please sign off on Rx

## 2021-10-19 NOTE — Progress Notes (Signed)
Subjective:   HPI  Donald Oliver is a 51 y.o. male who presents for Chief Complaint  Patient presents with   Annual Exam    Non fasting cpe.    Patient Care Team: Tyreshia Ingman, Leward Quan as PCP - General (Family Medicine) Sees dentist Sees eye doctor Dr. Shirlee More, cardiology Dr. Ila Mcgill, podiatry Dr. Melony Overly, ENT   Concerns: Not checking BPs at home.  Gives blood regularly and BP is usually 130/80 there.  Now that he has insurance, wants to go back to Androgel.  He was on Androgel 4 pumps daily in past, but currently using compounded cream which is relatively good  Wants to try chant ix to help quit tobacco.  Still vapes and uses nicotine gum  OSA - not sure if his machine works all that well  He is compliant with medications  Has 1 skin lesion on left shoulder that he is concerned about  No other complaint  Reviewed their medical, surgical, family, social, medication, and allergy history and updated chart as appropriate.  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 Cyndia Bent with LIMA to LAD, free RIMA to Diag and SVG to RCA     Combined hyperlipidemia    Depressed mood 07/19/2019   Eczema 09/05/2018   Elevated LFTs 05/24/2018   Encounter for health maintenance examination in adult 02/25/2015   Erectile dysfunction    Essential hypertension    Family history of premature CAD    Fatigue 05/24/2018   Fatty liver disease, nonalcoholic 5/42/7062   FH: premature coronary heart disease    Father had MI at age 72    Former smoker 08/29/2016   Gastroesophageal reflux disease 02/25/2015   GERD (gastroesophageal reflux disease)    History of substance abuse ([place])    Pt had abused amphetamines : quit in 2005   Hypertension 2005    Impaired fasting blood sugar 2016   Keloid scar of skin 06/21/2016   Lipoma 07/19/2019   Low testosterone in male 05/24/2018   Metatarsalgia of right foot 06/21/2016   Mixed dyslipidemia 09/05/2018   Nail deformity 07/20/2015   Need for prophylactic vaccination against Streptococcus pneumoniae (pneumococcus) 12/17/2015   Need for prophylactic vaccination and inoculation against influenza 12/17/2015   Onychomycosis    OSA on CPAP 10/31/2016   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    Past Surgical History:  Procedure Laterality Date   CARDIAC CATHETERIZATION N/A 05/29/2015   Procedure: Left Heart Cath and Coronary Angiography;  Surgeon: Jettie Booze, MD;  Location: [facility];  Service: Cardiovascular;  Laterality: N/A;   CARDIAC CATHETERIZATION  05/29/2015   Procedure: Coronary Balloon Angioplasty;  Surgeon: Jettie Booze, MD;  Location: [facility];  Service: Cardiovascular;;   CORONARY ARTERY BYPASS GRAFT N/A 06/02/2015   Procedure: CORONARY ARTERY BYPASS GRAFTING (CABG) x  3 utilizing bilateral internal mammary artery and endoscopically harvested right sapheneous vein.;  Surgeon: [name], MD;  Location: [facility];  Service: Open Heart Surgery;  Laterality: N/A;   RHINOPLASTY  20065   w/repair of nasal fractuare; done at [facility] 06/02/2015   Procedure: TRANSESOPHAGEAL ECHOCARDIOGRAM (TEE);  Surgeon: [name], MD;  Location: [facility]  OR;  Service: Open Heart Surgery;  Laterality: N/A;    Family History  Problem Relation Age of Onset   Heart failure Father    Heart disease Father 74       died of MI   Diabetes Father    Heart disease Mother        CAD, stents   Diabetes Mother    HIV Sister    Cancer Maternal Aunt        lung, smoker   Stroke Neg Hx      Current Outpatient Medications:    amLODipine (NORVASC) 10 MG tablet, TAKE ONE TABLET BY MOUTH DAILY, Disp: 90 tablet, Rfl: 3   aspirin EC 81 MG tablet, Take 1 tablet (81 mg total) by  mouth daily. Swallow whole., Disp: 90 tablet, Rfl: 3   lisinopril-hydrochlorothiazide (ZESTORETIC) 20-25 MG tablet, TAKE ONE TABLET BY MOUTH DAILY, Disp: 90 tablet, Rfl: 3   metoprolol succinate (TOPROL-XL) 25 MG 24 hr tablet, Take 1 tablet (25 mg total) by mouth daily., Disp: 90 tablet, Rfl: 1   omega-3 acid ethyl esters (LOVAZA) 1 g capsule, TAKE ONE CAPSULE BY MOUTH TWICE A DAY, Disp: 180 capsule, Rfl: 3   pantoprazole (PROTONIX) 40 MG tablet, TAKE ONE TABLET BY MOUTH DAILY, Disp: 90 tablet, Rfl: 3   Testosterone (ANDROGEL) 40.5 MG/2.5GM (1.62%) GEL, Place 4 Pump onto the skin daily., Disp: 5 g, Rfl: 3   varenicline (CHANTIX) 0.5 MG tablet, Take 1 tablet (0.5 mg total) by mouth 2 (two) times daily., Disp: 60 tablet, Rfl: 1   nitroGLYCERIN (NITROSTAT) 0.4 MG SL tablet, Place 1 tablet (0.4 mg total) under the tongue every 5 (five) minutes as needed for chest pain. (Patient not taking: Reported on 10/19/2021), Disp: 90 tablet, Rfl: 3   rosuvastatin (CRESTOR) 10 MG tablet, Take 1 tablet (10 mg total) by mouth daily., Disp: 90 tablet, Rfl: 3   tadalafil (CIALIS) 5 MG tablet, Take 1 tablet (5 mg total) by mouth daily as needed for erectile dysfunction., Disp: 90 tablet, Rfl: 3  Allergies  Allergen Reactions   Amoxicillin Swelling    Hand swelling - as a teenager Has patient had a PCN reaction causing immediate rash, facial/tongue/throat swelling, SOB or lightheadedness with hypotension: Yes Has patient had a PCN reaction causing severe rash involving mucus membranes or skin necrosis: No Has patient had a PCN reaction that required hospitalization No Has patient had a PCN reaction occurring within the last 10 years: No If all of the above answers are "NO", then may proceed with Cephalosporin use. Hand swelling - as a teenager Has patient had a PCN reaction causing immediate rash, facial/tongue/throat swelling, SOB or lightheadedness with hypotension: Yes Has patient had a PCN reaction causing severe  rash involving mucus membranes or skin necrosis: No Has patient had a PCN reaction that required hospitalization No Has patient had a PCN reaction occurring within the last 10 years: No If all of the above answers are "NO", then may proceed with Cephalosporin use.   Fluoxetine Other (See Comments)    Cloudy mentation, sexual side effects  Cloudy mentation, sexual side effects     Review of Systems Constitutional: -fever, -chills, -sweats, -unexpected weight change, -decreased appetite, -fatigue Allergy: -sneezing, -itching, -congestion Dermatology: +changing moles, --rash, -lumps ENT: -runny nose, -ear pain, -sore throat, -hoarseness, -sinus pain, -teeth pain, - ringing in ears, -hearing loss, -nosebleeds Cardiology: -chest pain, -palpitations, -swelling, -difficulty breathing when lying flat, -waking up short of breath Respiratory: -cough, -shortness of  breath, -difficulty breathing with exercise or exertion, -wheezing, -coughing up blood Gastroenterology: -abdominal pain, -nausea, -vomiting, -diarrhea, -constipation, -blood in stool, -changes in bowel movement, -difficulty swallowing or eating Hematology: -bleeding, -bruising  Musculoskeletal: -joint aches, -muscle aches, -joint swelling, -back pain, -neck pain, -cramping, -changes in gait Ophthalmology: denies vision changes, eye redness, itching, discharge Urology: -burning with urination, -difficulty urinating, -blood in urine, -urinary frequency, -urgency, -incontinence Neurology: -headache, -weakness, -tingling, -numbness, -memory loss, -falls, -dizziness Psychology: -depressed mood, -agitation Male GU: no testicular mass, pain, no lymph nodes swollen, no swelling, no rash.       10/19/2021    1:21 PM 10/08/2020    9:34 AM 07/09/2020   12:35 PM 07/19/2019    9:19 AM 09/05/2018   11:19 AM  Depression screen PHQ 2/9  Decreased Interest 0 1 2 0 0  Down, Depressed, Hopeless 0 1 3 0 0  PHQ - 2 Score 0 2 5 0 0  Altered sleeping  2  2    Tired, decreased energy  1 2    Change in appetite  0 2    Feeling bad or failure about yourself   1 2    Trouble concentrating  0 2    Moving slowly or fidgety/restless  0 1    Suicidal thoughts  0 0    PHQ-9 Score  6 16    Difficult doing work/chores  Somewhat difficult Somewhat difficult          Objective:  BP 130/80   Pulse 70   Wt 202 lb 12.8 oz (92 kg)   SpO2 97%   BMI 30.84 kg/m   Wt Readings from Last 3 Encounters:  10/19/21 202 lb 12.8 oz (92 kg)  02/05/21 203 lb 6.4 oz (92.3 kg)  01/05/21 194 lb 1.9 oz (88.1 kg)   BP Readings from Last 3 Encounters:  10/19/21 130/80  02/05/21 122/80  01/05/21 (!) 142/80    General appearance: alert, no distress, WD/WN, Caucasian male Skin: left upper back with  erythema from his self excoriations.  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 round lesions suggestive of strawberry hemangioma, similar smaller one on right anterior chest  HEENT: normocephalic, conjunctiva/corneas normal, sclerae anicteric, PERRLA, EOMi Neck: supple, no lymphadenopathy, no thyromegaly, no masses, normal ROM, no bruits Chest: non tender, normal shape and expansion Heart: RRR, normal S1, S2, no murmurs Lungs: vertical CABG scar with keloid, CTA bilaterally, no wheezes, rhonchi, or rales Abdomen: +bs, soft, non tender, non distended, no masses, no hepatomegaly, no splenomegaly, no bruits Back: non tender, normal ROM, no scoliosis Musculoskeletal: upper extremities non tender, no obvious deformity, normal ROM throughout, lower extremities non tender, no obvious deformity, normal ROM throughout Extremities: no edema, no cyanosis, no clubbing Pulses: 2+ symmetric, upper and lower extremities, normal cap refill Neurological: alert, oriented x 3, CN2-12 intact, strength normal upper extremities and lower extremities, sensation normal throughout, DTRs 2+ throughout, no cerebellar signs, gait normal Psychiatric: normal affect, behavior normal,  pleasant  GU/rectal - deferred, declined   Assessment and Plan :   Encounter Diagnoses  Name Primary?   Encounter for health maintenance examination in adult Yes   Coronary artery disease involving native coronary artery of native heart without angina pectoris    Hypogonadism in male    Impaired fasting blood sugar    Vaccine counseling    Screening for prostate cancer    S/P CABG x 3    OSA on CPAP    Mixed dyslipidemia  Low testosterone in male    Keloid scar of skin    Benign prostatic hyperplasia, unspecified whether lower urinary tract symptoms present    Essential hypertension    Erectile dysfunction, unspecified erectile dysfunction type    Fatty liver disease, nonalcoholic    Former smoker    Gastroesophageal reflux disease, unspecified whether esophagitis present    Vapes nicotine containing substance      This visit was a preventative care visit, also known as wellness visit or routine physical.   Topics typically include healthy lifestyle, diet, exercise, preventative care, vaccinations, sick and well care, proper use of emergency dept and after hours care, as well as other concerns.     Recommendations: Continue to return yearly for your annual wellness and preventative care visits.  This gives [place] a chance to discuss healthy lifestyle, exercise, vaccinations, review your chart record, and perform screenings where appropriate.  I recommend you see your eye doctor yearly for routine vision care.  I recommend you see your dentist yearly for routine dental care including hygiene visits twice yearly.   Vaccination recommendations were reviewed Immunization History  Administered Date(s) Administered   Influenza Split 01/28/2015   Influenza,inj,Quad PF,6+ Mos 12/17/2015   Influenza-Unspecified 01/28/2015, 02/12/2017   Pneumococcal Polysaccharide-23 12/17/2015, 07/09/2020   Tdap 04/01/2015    Shingles vaccine:  I recommend you have a shingles vaccine to help  prevent shingles or herpes zoster outbreak.   Please call your insurer to inquire about coverage for the Shingrix vaccine given in 2 doses.   Some insurers cover this vaccine after age 52, some cover this after age 86.  If your insurer covers this, then call to schedule appointment to have this vaccine here.  I recommend a yearly flu shot in the fall   Screening for cancer: Colon cancer screening: I reviewed your Cologuard from 2022 that was negative . Plan to repeat this in 2025.    We discussed PSA, prostate exam, and prostate cancer screening risks/benefits.     Skin cancer screening: Check your skin regularly for new changes, growing lesions, or other lesions of concern Come in for evaluation if you have skin lesions of concern.  Lung cancer screening: If you have a greater than 20 pack year history of tobacco use, then you may qualify for lung cancer screening with a chest CT scan.   Please call your insurance company to inquire about coverage for this test.  We currently don't have screenings for other cancers besides breast, cervical, colon, and lung cancers.  If you have a strong family history of cancer or have other cancer screening concerns, please let me know.    Bone health: Get at least 150 minutes of aerobic exercise weekly Get weight bearing exercise at least once weekly Bone density test:  A bone density test is an imaging test that uses a type of X-ray to measure the amount of calcium and other minerals in your bones. The test may be used to diagnose or screen you for a condition that causes weak or thin bones (osteoporosis), predict your risk for a broken bone (fracture), or determine how well your osteoporosis treatment is working. The bone density test is recommended for females 49 and older, or females or males <75 if certain risk factors such as thyroid disease, long term use of steroids such as for asthma or rheumatological issues, vitamin D deficiency, estrogen  deficiency, family history of osteoporosis, self or family history of fragility fracture in first degree relative.  Heart health: Get at least 150 minutes of aerobic exercise weekly Limit alcohol It is important to maintain a healthy blood pressure and healthy cholesterol numbers  Continue to see your cardiologist regularly   Medical care options: I recommend you continue to seek care here first for routine care.  We try really hard to have available appointments Monday through Friday daytime hours for sick visits, acute visits, and physicals.  Urgent care should be used for after hours and weekends for significant issues that cannot wait till the next day.  The emergency department should be used for significant potentially life-threatening emergencies.  The emergency department is expensive, can often have long wait times for less significant concerns, so try to utilize primary care, urgent care, or telemedicine when possible to avoid unnecessary trips to the emergency department.  Virtual visits and telemedicine have been introduced since the pandemic started in 2020, and can be convenient ways to receive medical care.  We offer virtual appointments as well to assist you in a variety of options to seek medical care.    Separate significant issues discussed: Sleep apnea-we will contact [facility] about compliance report.  I reviewed his last sleep study from 2018.  Begin trial of generic Chantix to help quit vaping/ tobacco.  He has used Wellbutrin in the past.  He uses nicotine products such as nicotine gum  Continue to see cardiology regularly.  History of heart disease, CABG, hypertension  Mixed dyslipidemia-labs today, noncompliant with Zetia but he is taking statin and fish oil  Low testosterone - stop compound cream and change to Androgel.   Recheck labs today  Continue Cialis  Hypertension-continue current medications   Horace was seen today for annual exam.  Diagnoses and  all orders for this visit:  Encounter for health maintenance examination in adult -     Comprehensive metabolic panel -     CBC -     Lipid panel -     PSA -     Testosterone -     Hemoglobin A1c  Coronary artery disease involving native coronary artery of native heart without angina pectoris  Hypogonadism in male -     Testosterone  Impaired fasting blood sugar -     Hemoglobin A1c  Vaccine counseling  Screening for prostate cancer -     PSA  S/P CABG x 3  OSA on CPAP  Mixed dyslipidemia -     Lipid panel  Low testosterone in male -     Testosterone  Keloid scar of skin  Benign prostatic hyperplasia, unspecified whether lower urinary tract symptoms present -     PSA  Essential hypertension  Erectile dysfunction, unspecified erectile dysfunction type  Fatty liver disease, nonalcoholic  Former smoker  Gastroesophageal reflux disease, unspecified whether esophagitis present  Vapes nicotine containing substance  Other orders -     varenicline (CHANTIX) 0.5 MG tablet; Take 1 tablet (0.5 mg total) by mouth 2 (two) times daily. -     aspirin EC 81 MG tablet; Take 1 tablet (81 mg total) by mouth daily. Swallow whole. -     rosuvastatin (CRESTOR) 10 MG tablet; Take 1 tablet (10 mg total) by mouth daily. -     tadalafil (CIALIS) 5 MG tablet; Take 1 tablet (5 mg total) by mouth daily as needed for erectile dysfunction. -     Testosterone (ANDROGEL) 40.5 MG/2.5GM (1.62%) GEL; Place 4 Pump onto the skin daily.     Follow-up pending labs, yearly for  physical

## 2021-10-20 ENCOUNTER — Other Ambulatory Visit: Payer: Self-pay | Admitting: Medical

## 2021-10-20 LAB — COMPREHENSIVE METABOLIC PANEL
ALT: 48 IU/L — ABNORMAL HIGH (ref 0–44)
AST: 33 IU/L (ref 0–40)
Albumin/Globulin Ratio: 2.1 (ref 1.2–2.2)
Albumin: 5 g/dL — ABNORMAL HIGH (ref 3.8–4.9)
Alkaline Phosphatase: 73 IU/L (ref 44–121)
BUN/Creatinine Ratio: 14 (ref 9–20)
BUN: 14 mg/dL (ref 6–24)
Bilirubin Total: 1.5 mg/dL — ABNORMAL HIGH (ref 0.0–1.2)
CO2: 26 mmol/L (ref 20–29)
Calcium: 9.5 mg/dL (ref 8.7–10.2)
Chloride: 101 mmol/L (ref 96–106)
Creatinine, Ser: 1.03 mg/dL (ref 0.76–1.27)
Globulin, Total: 2.4 g/dL (ref 1.5–4.5)
Glucose: 83 mg/dL (ref 70–99)
Potassium: 4 mmol/L (ref 3.5–5.2)
Sodium: 142 mmol/L (ref 134–144)
Total Protein: 7.4 g/dL (ref 6.0–8.5)
eGFR: 87 mL/min/{1.73_m2} (ref >59)

## 2021-10-20 LAB — CBC
Hematocrit: 49.1 % (ref 37.5–51.0)
Hemoglobin: 16.5 g/dL (ref 13.0–17.7)
MCH: 28.6 pg (ref 26.6–33.0)
MCHC: 33.6 g/dL (ref 31.5–35.7)
MCV: 85 fL (ref 79–97)
Platelets: 321 10*3/uL (ref 150–450)
RBC: 5.76 x10E6/uL (ref 4.14–5.80)
RDW: 13.7 % (ref 11.6–15.4)
WBC: 7.7 10*3/uL (ref 3.4–10.8)

## 2021-10-20 LAB — LIPID PANEL
Chol/HDL Ratio: 4.4 ratio (ref 0.0–5.0)
Cholesterol, Total: 180 mg/dL (ref 100–199)
HDL: 41 mg/dL (ref >39)
LDL Chol Calc (NIH): 94 mg/dL (ref 0–99)
Triglycerides: 268 mg/dL — ABNORMAL HIGH (ref 0–149)
VLDL Cholesterol Cal: 45 mg/dL — ABNORMAL HIGH (ref 5–40)

## 2021-10-20 LAB — HEMOGLOBIN A1C
Est. average glucose Bld gHb Est-mCnc: 114 mg/dL
Hgb A1c MFr Bld: 5.6 % (ref 4.8–5.6)

## 2021-10-20 LAB — TESTOSTERONE: Testosterone: 709 ng/dL (ref 264–916)

## 2021-10-20 LAB — PSA: Prostate Specific Ag, Serum: 1.3 ng/mL (ref 0.0–4.0)

## 2021-10-20 MED ORDER — ROSUVASTATIN CALCIUM 20 MG PO TABS: 20.0000 mg | ORAL_TABLET | Freq: Every day | ORAL | 3 refills | Status: DC

## 2021-10-30 DIAGNOSIS — Z720 Tobacco use: Secondary | ICD-10-CM | POA: Diagnosis not present

## 2021-11-19 ENCOUNTER — Telehealth: Payer: Self-pay | Admitting: Internal Medicine

## 2021-11-19 ENCOUNTER — Other Ambulatory Visit: Payer: Self-pay | Admitting: Medical

## 2021-11-19 ENCOUNTER — Telehealth: Payer: Self-pay | Admitting: Medical

## 2021-11-19 NOTE — Telephone Encounter (Signed)
Medication NDC Prescription # DAW  TESTOSTERONE (AT) (TOPI) 10% ('100MG'$ /ML) GEL 10% ('100MG'$ /ML) GEL Supply [id number] No  Written Date Last Fill Date Quantity Days Supply  05/20/2021 10/19/2021 60 each 30  Sig Notes  Apply 18m (8 clicks) daily & rub in as desire. Compounded Testosterone 10%    [facility] request from [facility]. Ok to refill?

## 2021-11-19 NOTE — Telephone Encounter (Signed)
Pt states he does not need the compound med to pharmacy. Another med of testosterone was sent to [facility]

## 2021-11-19 NOTE — Telephone Encounter (Signed)
Pt states his testosterone requiring P.A.

## 2021-11-20 NOTE — Telephone Encounter (Signed)
P.A. TESTOSTERONE GEL completed

## 2021-11-27 NOTE — Telephone Encounter (Signed)
P.A. approved til 11/19/22, sent mychart message, faxed pharmacy

## 2021-11-30 ENCOUNTER — Telehealth: Payer: Self-pay | Admitting: Internal Medicine

## 2021-11-30 DIAGNOSIS — G4733 Obstructive sleep apnea (adult) (pediatric): Secondary | ICD-10-CM

## 2021-11-30 NOTE — Telephone Encounter (Signed)
Put in new order to [facility] for CPAP mahine and supplies

## 2021-12-01 ENCOUNTER — Encounter: Payer: Self-pay | Admitting: Internal Medicine

## 2021-12-09 DIAGNOSIS — G4733 Obstructive sleep apnea (adult) (pediatric): Secondary | ICD-10-CM | POA: Diagnosis not present

## 2021-12-29 ENCOUNTER — Other Ambulatory Visit: Payer: Self-pay | Admitting: Medical

## 2022-01-08 DIAGNOSIS — G4733 Obstructive sleep apnea (adult) (pediatric): Secondary | ICD-10-CM | POA: Diagnosis not present

## 2022-01-11 ENCOUNTER — Encounter: Payer: Self-pay | Admitting: Cardiology

## 2022-01-11 ENCOUNTER — Ambulatory Visit: Payer: BC Managed Care – PPO | Attending: Cardiology | Admitting: Cardiology

## 2022-01-11 VITALS — BP 134/80 | HR 69 | Ht 70.0 in | Wt 205.2 lb

## 2022-01-11 DIAGNOSIS — E782 Mixed hyperlipidemia: Secondary | ICD-10-CM | POA: Diagnosis not present

## 2022-01-11 DIAGNOSIS — Z951 Presence of aortocoronary bypass graft: Secondary | ICD-10-CM

## 2022-01-11 DIAGNOSIS — I25110 Atherosclerotic heart disease of native coronary artery with unstable angina pectoris: Secondary | ICD-10-CM

## 2022-01-11 DIAGNOSIS — I10 Essential (primary) hypertension: Secondary | ICD-10-CM

## 2022-01-11 NOTE — Progress Notes (Signed)
Cardiology Office Note:    Date:  01/11/2022   ID:  CHARON AKAMINE, DOB [birth year], MRN [id number]  PCP:  Carlena Hurl, PA-C  Cardiologist:  Shirlee More, MD    Referring MD: Carlena Hurl, PA-C    ASSESSMENT:    1. Coronary artery disease involving native coronary artery of native heart with unstable angina pectoris ([place])   2. S/P CABG x 3   3. Essential hypertension   4. Mixed dyslipidemia    PLAN:    In order of problems listed above:  Pastor continues to do well with CAD he is having 9 anginal symptoms undergo stress echocardiogram.  Continue aspirin lipid-lowering Zetia plus rosuvastatin and his antihypertensive agents. BP at target continue his current agents including ACE thiazide diuretic beta-blocker Continue combined Zetia and statin recheck lipid profile goal LDL less than 70 ideally less than 55  Next appointment: 1 year   Medication Adjustments/Labs and Tests Ordered: Current medicines are reviewed at length with the patient today.  Concerns regarding medicines are outlined above.  No orders of the defined types were placed in this encounter.  No orders of the defined types were placed in this encounter.   Chief Complaint  Patient presents with   Follow-up   Coronary Artery Disease    History of Present Illness:    Donald Oliver is a 51 y.o. male with a hx of CAD with CABG 2017 hypertension and hyperlipidemia last seen 01/05/2021.  Compliance with diet, lifestyle and medications: Yes  He continues to have vague intermittent symptoms of chest discomfort brief nonexertional happens in the morning happens under stress but does not occur with physical activity or when he exercises as before it makes him apprehensive he had a myocardial perfusion study performed in 2021 that was normal.  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 Lilia Pro to set him up to undergo a stress echocardiogram if abnormal I would favor coronary angiography otherwise reassurance  and he is comfortable with this approach  Intensify lipid-lowering therapy he is now taking Zetia doubled his dose of rosuvastatin we will recheck his lipid profile today  He is having no muscle pain or weakness Past Medical History:  Diagnosis Date   Benign prostatic hyperplasia 09/05/2018   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 Cyndia Bent with LIMA to LAD, free RIMA to Diag and SVG to RCA     Combined hyperlipidemia    Depressed mood 07/19/2019   Eczema 09/05/2018   Elevated LFTs 05/24/2018   Encounter for health maintenance examination in adult 02/25/2015   Erectile dysfunction    Essential hypertension    Family history of premature CAD    Fatigue 05/24/2018   Fatty liver disease, nonalcoholic 07/04/8117   FH: premature coronary heart disease    Father had MI at age 35    Former smoker 08/29/2016   Gastroesophageal reflux disease 02/25/2015   GERD (gastroesophageal reflux disease)    History of substance abuse ([place])    Pt had abused amphetamines : quit in 2005   Hypertension 2005   Impaired fasting blood sugar 2016   Keloid scar of skin 06/21/2016   Lipoma 07/19/2019   Low testosterone in male 05/24/2018   Metatarsalgia of right foot 06/21/2016   Mixed dyslipidemia 09/05/2018   Nail deformity 07/20/2015  Need for prophylactic vaccination against Streptococcus pneumoniae (pneumococcus) 12/17/2015   Need for prophylactic vaccination and inoculation against influenza 12/17/2015   Onychomycosis    OSA on CPAP 10/31/2016   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    Past Surgical History:  Procedure Laterality Date   CARDIAC CATHETERIZATION N/A 05/29/2015   Procedure: Left Heart Cath and Coronary Angiography;  Surgeon: Jettie Booze, MD;  Location: [facility];  Service: Cardiovascular;  Laterality: N/A;   CARDIAC CATHETERIZATION  05/29/2015    Procedure: Coronary Balloon Angioplasty;  Surgeon: Jettie Booze, MD;  Location: [facility];  Service: Cardiovascular;;   CORONARY ARTERY BYPASS GRAFT N/A 06/02/2015   Procedure: CORONARY ARTERY BYPASS GRAFTING (CABG) x  3 utilizing bilateral internal mammary artery and endoscopically harvested right sapheneous vein.;  Surgeon: [name], MD;  Location: [facility];  Service: Open Heart Surgery;  Laterality: N/A;   RHINOPLASTY  20065   w/repair of nasal fractuare; done at [facility] 06/02/2015   Procedure: TRANSESOPHAGEAL ECHOCARDIOGRAM (TEE);  Surgeon: [name], MD;  Location: [facility];  Service: Open Heart Surgery;  Laterality: N/A;    Current Medications: Current Meds  Medication Sig   amLODipine (NORVASC) 10 MG tablet TAKE ONE TABLET BY MOUTH DAILY   aspirin EC 81 MG tablet Take 1 tablet (81 mg total) by mouth daily. Swallow whole.   lisinopril-hydrochlorothiazide (ZESTORETIC) 20-25 MG tablet TAKE ONE TABLET BY MOUTH DAILY   metoprolol succinate (TOPROL-XL) 25 MG 24 hr tablet Take 1 tablet (25 mg total) by mouth daily.   nitroGLYCERIN (NITROSTAT) 0.4 MG SL tablet Place 1 tablet (0.4 mg total) under the tongue every 5 (five) minutes as needed for chest pain.   omega-3 acid ethyl esters (LOVAZA) 1 g capsule TAKE ONE CAPSULE BY MOUTH TWICE A DAY   pantoprazole (PROTONIX) 40 MG tablet TAKE ONE TABLET BY MOUTH DAILY   rosuvastatin (CRESTOR) 20 MG tablet Take 1 tablet (20 mg total) by mouth daily.   tadalafil (CIALIS) 5 MG tablet Take 1 tablet (5 mg total) by mouth daily as needed for erectile dysfunction.   Testosterone 20.25 MG/ACT (1.62%) GEL PLACE 4 PUMPS ONTO THE SKIN DAILY AS DIRECTED   varenicline (CHANTIX) 0.5 MG tablet Take 1 tablet (0.5 mg total) by mouth 2 (two) times daily.     Allergies:   Amoxicillin and Fluoxetine   Social History   Socioeconomic History   Marital status: Legally Separated    Spouse name: Not on file   Number  of children: 3   Years of education: 12    Highest education level: Not on file  Occupational History   Occupation: Sports coach    Comment: unemployed   Tobacco Use   Smoking status: Former    Packs/day: 1.00    Years: 30.00    Total pack years: 30.00    Types: Cigarettes    Passive exposure: Past   Smokeless tobacco: Never  Vaping Use   Vaping Use: Some days   Substances: Nicotine  Substance and Sexual Activity   Alcohol use: Yes    Alcohol/week: 1.0 standard drink of alcohol    Types: 1 Standard drinks or equivalent per week    Comment: rarely   Drug use: Not Currently    Frequency: 1.0 times per week    Types: Marijuana    Comment: 05/29/2015 "none in the last 10 years"   Sexual activity: Yes  Other Topics  Concern   Not on file  Social History Narrative   Married has 3 girls, 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   06/2019   Social Determinants of Radio broadcast assistant Strain: Not on file  Food Insecurity: Not on file  Transportation Needs: Not on file  Physical Activity: Not on file  Stress: Not on file  Social Connections: Not on file     Family History: The patient's family history includes Cancer in his maternal aunt; Diabetes in his father and mother; HIV in his sister; Heart disease in his mother; Heart disease (age of onset: 42) in his father; Heart failure in his father. There is no history of Stroke. ROS:   Please see the history of present illness.    All other systems reviewed and are negative.  EKGs/Labs/Other Studies Reviewed:    The following studies were reviewed today:  EKG:  EKG ordered today and personally reviewed.  The ekg ordered today demonstrates left posterior fascicular block otherwise normal EKG sinus rhythm  Recent Labs: 10/19/2021: ALT 48; BUN 14; Creatinine, Ser 1.03; Hemoglobin 16.5; Platelets 321; Potassium 4.0;  Sodium 142  Recent Lipid Panel    Component Value Date/Time   CHOL 180 10/19/2021 1354   TRIG 268 (H) 10/19/2021 1354   HDL 41 10/19/2021 1354   CHOLHDL 4.4 10/19/2021 1354   CHOLHDL 5.6 (H) 10/31/2016 1500   VLDL 74 (H) 10/31/2016 1500   LDLCALC 94 10/19/2021 1354    Physical Exam:    VS:  BP 134/80 (BP Location: Right Arm, Patient Position: Sitting)   Pulse 69   Ht '5\' 10"'$  (1.778 m)   Wt 205 lb 3.2 oz (93.1 kg)   SpO2 97%   BMI 29.44 kg/m     Wt Readings from Last 3 Encounters:  01/11/22 205 lb 3.2 oz (93.1 kg)  10/19/21 202 lb 12.8 oz (92 kg)  02/05/21 203 lb 6.4 oz (92.3 kg)     GEN:  Well nourished, well developed in no acute distress HEENT: Normal NECK: No JVD; No carotid bruits LYMPHATICS: No lymphadenopathy CARDIAC: RRR, no murmurs, rubs, gallops RESPIRATORY:  Clear to auscultation without rales, wheezing or rhonchi  ABDOMEN: Soft, non-tender, non-distended MUSCULOSKELETAL:  No edema; No deformity  SKIN: Warm and dry NEUROLOGIC:  Alert and oriented x 3 PSYCHIATRIC:  Normal affect    Signed, Shirlee More, MD  01/11/2022 12:01 PM    [facility]

## 2022-01-11 NOTE — Patient Instructions (Signed)
Medication Instructions:  Your physician recommends that you continue on your current medications as directed. Please refer to the Current Medication list given to you today.  *If you need a refill on your cardiac medications before your next appointment, please call your pharmacy*   Lab Work: Your physician recommends that you return for lab work in: Today for [place]  If you have labs (blood work) drawn today and your tests are completely normal, you will receive your results only by: MyChart Message (if you have MyChart) OR A paper copy in the mail If you have any lab test that is abnormal or we need to change your treatment, we will call you to review the results.   Testing/Procedures: Your physician has requested that you have an exercise tolerance test. For further information please visit [email or web address]. Please also follow instruction sheet, as given. Test will take approximately 45 minutes Take medication as normal. No Beta Blocker Wear Comfortable clothing (no dresses or overalls) and walking shoes, tennis shoes preferred. Do not wear cologne, aftershave, perfume or lotions (deoderant is allowed) Test will be performed at [address] in [place]: At [facility], you and your health needs are our priority.  As part of our continuing mission to provide you with exceptional heart care, we have created designated Provider Care Teams.  These Care Teams include your primary Cardiologist (physician) and Advanced Practice Providers (APPs -  Physician Assistants and Nurse Practitioners) who all work together to provide you with the care you need, when you need it.  We recommend signing up for the patient portal called "MyChart".  Sign up information is provided on this After Visit Summary.  MyChart is used to connect with patients for Virtual Visits (Telemedicine).  Patients are able to view lab/test results, encounter notes, upcoming  appointments, etc.  Non-urgent messages can be sent to your provider as well.   To learn more about what you can do with MyChart, go to [email or web address].    Your next appointment:   1 year(s)  The format for your next appointment:   In Person  Provider:   Shirlee More, MD    Other Instructions   Important Information About Sugar

## 2022-01-11 NOTE — Addendum Note (Signed)
Addended by: Shirlee More on: 01/11/2022 06:16 PM   Modules accepted: Orders

## 2022-01-11 NOTE — Addendum Note (Signed)
Addended by: Jerl Santos R on: 01/11/2022 05:24 PM   Modules accepted: Orders

## 2022-01-12 LAB — LIPOPROTEIN A (LPA): Lipoprotein (a): <8.4 nmol/L (ref <75.0)

## 2022-01-12 LAB — LIPID PANEL
Chol/HDL Ratio: 2.3 ratio (ref 0.0–5.0)
Cholesterol, Total: 113 mg/dL (ref 100–199)
HDL: 50 mg/dL (ref >39)
LDL Chol Calc (NIH): 47 mg/dL (ref 0–99)
Triglycerides: 80 mg/dL (ref 0–149)
VLDL Cholesterol Cal: 16 mg/dL (ref 5–40)

## 2022-01-15 ENCOUNTER — Other Ambulatory Visit: Payer: Self-pay | Admitting: Medical

## 2022-01-15 ENCOUNTER — Other Ambulatory Visit: Payer: Self-pay | Admitting: Cardiology

## 2022-01-24 ENCOUNTER — Telehealth (HOSPITAL_COMMUNITY): Payer: Self-pay | Admitting: *Deleted

## 2022-01-24 NOTE — Telephone Encounter (Signed)
Patient given instructions for upcoming stress echo.  Donald Oliver

## 2022-01-28 ENCOUNTER — Ambulatory Visit (HOSPITAL_COMMUNITY): Payer: BC Managed Care – PPO

## 2022-01-28 ENCOUNTER — Ambulatory Visit (HOSPITAL_COMMUNITY): Payer: BC Managed Care – PPO | Attending: Cardiology

## 2022-01-28 DIAGNOSIS — Z951 Presence of aortocoronary bypass graft: Secondary | ICD-10-CM | POA: Insufficient documentation

## 2022-01-28 DIAGNOSIS — E782 Mixed hyperlipidemia: Secondary | ICD-10-CM | POA: Insufficient documentation

## 2022-01-28 DIAGNOSIS — I25110 Atherosclerotic heart disease of native coronary artery with unstable angina pectoris: Secondary | ICD-10-CM | POA: Diagnosis not present

## 2022-01-28 DIAGNOSIS — I10 Essential (primary) hypertension: Secondary | ICD-10-CM | POA: Diagnosis not present

## 2022-01-28 MED ORDER — PERFLUTREN LIPID MICROSPHERE
1.0000 mL | INTRAVENOUS | Status: AC | PRN
  Administered 2022-01-28 (×2): 2 mL via INTRAVENOUS
  Administered 2022-01-28: 3 mL via INTRAVENOUS

## 2022-01-31 ENCOUNTER — Other Ambulatory Visit: Payer: Self-pay | Admitting: Medical

## 2022-02-08 DIAGNOSIS — G4733 Obstructive sleep apnea (adult) (pediatric): Secondary | ICD-10-CM | POA: Diagnosis not present

## 2022-03-05 ENCOUNTER — Other Ambulatory Visit: Payer: Self-pay | Admitting: Medical

## 2022-03-07 NOTE — Telephone Encounter (Signed)
Refill request last apt 10/19/21 next apt 10/24/22.

## 2022-03-10 DIAGNOSIS — G4733 Obstructive sleep apnea (adult) (pediatric): Secondary | ICD-10-CM | POA: Diagnosis not present

## 2022-04-05 ENCOUNTER — Other Ambulatory Visit: Payer: Self-pay | Admitting: Medical

## 2022-04-06 ENCOUNTER — Encounter: Payer: Self-pay | Admitting: Internal Medicine

## 2022-04-06 NOTE — Telephone Encounter (Signed)
Refill request last apt 10/19/21 next apt 10/14/22.

## 2022-04-10 DIAGNOSIS — G4733 Obstructive sleep apnea (adult) (pediatric): Secondary | ICD-10-CM | POA: Diagnosis not present

## 2022-04-14 ENCOUNTER — Other Ambulatory Visit: Payer: Self-pay | Admitting: Cardiology

## 2022-05-09 ENCOUNTER — Other Ambulatory Visit: Payer: Self-pay | Admitting: Medical

## 2022-05-09 MED ORDER — TESTOSTERONE 20.25 MG/ACT (1.62%) TD GEL: TRANSDERMAL | 0 refills | Status: DC

## 2022-05-09 NOTE — Telephone Encounter (Signed)
Pt coming in Wednesday for appt.

## 2022-05-11 ENCOUNTER — Ambulatory Visit (INDEPENDENT_AMBULATORY_CARE_PROVIDER_SITE_OTHER): Payer: BC Managed Care – PPO | Admitting: Medical

## 2022-05-11 VITALS — BP 110/70 | HR 71 | Wt 195.2 lb

## 2022-05-11 DIAGNOSIS — D1801 Hemangioma of skin and subcutaneous tissue: Secondary | ICD-10-CM

## 2022-05-11 DIAGNOSIS — L989 Disorder of the skin and subcutaneous tissue, unspecified: Secondary | ICD-10-CM

## 2022-05-11 DIAGNOSIS — R7989 Other specified abnormal findings of blood chemistry: Secondary | ICD-10-CM

## 2022-05-11 DIAGNOSIS — E291 Testicular hypofunction: Secondary | ICD-10-CM | POA: Diagnosis not present

## 2022-05-11 DIAGNOSIS — I10 Essential (primary) hypertension: Secondary | ICD-10-CM

## 2022-05-11 DIAGNOSIS — Z79899 Other long term (current) drug therapy: Secondary | ICD-10-CM

## 2022-05-11 DIAGNOSIS — Z87891 Personal history of nicotine dependence: Secondary | ICD-10-CM | POA: Diagnosis not present

## 2022-05-11 DIAGNOSIS — Z122 Encounter for screening for malignant neoplasm of respiratory organs: Secondary | ICD-10-CM | POA: Diagnosis not present

## 2022-05-11 DIAGNOSIS — G4733 Obstructive sleep apnea (adult) (pediatric): Secondary | ICD-10-CM | POA: Insufficient documentation

## 2022-05-11 DIAGNOSIS — Z7185 Encounter for immunization safety counseling: Secondary | ICD-10-CM | POA: Diagnosis not present

## 2022-05-11 DIAGNOSIS — E782 Mixed hyperlipidemia: Secondary | ICD-10-CM | POA: Diagnosis not present

## 2022-05-11 DIAGNOSIS — R5383 Other fatigue: Secondary | ICD-10-CM | POA: Diagnosis not present

## 2022-05-11 NOTE — Progress Notes (Signed)
Subjective:  Donald Oliver is a 52 y.o. male who presents for Chief Complaint  Patient presents with   med check    Med check for testosterone     Here for recheck on low testosterone.  Using testosterone gel, 2 pumps each arm daily.   Feels like it works ok, but energy isn't the best.     OSA - compliant with CPAP.  Sees cardiology again later this year.  Has some skin concerns to look at  Compliant with medicaiton in general  History smoking x 30 years, but just vapes now  No other aggravating or relieving factors.    No other c/o.  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 Cyndia Bent with LIMA to LAD, free RIMA to Diag and SVG to RCA     Combined hyperlipidemia    Depressed mood 07/19/2019   Eczema 09/05/2018   Elevated LFTs 05/24/2018   Encounter for health maintenance examination in adult 02/25/2015   Erectile dysfunction    Essential hypertension    Family history of premature CAD    Fatigue 05/24/2018   Fatty liver disease, nonalcoholic [id number]   FH: premature coronary heart disease    Father had MI at age 34    Former smoker 08/29/2016   Gastroesophageal reflux disease 02/25/2015   GERD (gastroesophageal reflux disease)    History of substance abuse ([place])    Pt had abused amphetamines : quit in 2005   Hypertension 2005   Impaired fasting blood sugar 2016   Keloid scar of skin 06/21/2016   Lipoma 07/19/2019   Low testosterone in male 05/24/2018   Metatarsalgia of right foot 06/21/2016   Mixed dyslipidemia 09/05/2018   Nail deformity 07/20/2015   Need for prophylactic vaccination against Streptococcus pneumoniae (pneumococcus) 12/17/2015   Need for prophylactic vaccination and inoculation against influenza 12/17/2015   Onychomycosis    OSA  on CPAP 10/31/2016   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 90 tablet 3   aspirin EC 81 MG tablet Take 1 tablet (81 mg total) by mouth daily. Swallow whole. 90 tablet 3   ezetimibe (ZETIA) 10 MG tablet Take 1 tablet (10 mg total) by mouth daily. 90 tablet 3   lisinopril-hydrochlorothiazide (ZESTORETIC) 20-25 MG tablet Take 1 tablet by mouth daily. 90 tablet 3   metoprolol succinate (TOPROL-XL) 25 MG 24 hr tablet Take 1 tablet (25 mg total) by mouth daily. 90 tablet 3   omega-3 acid ethyl esters (LOVAZA) 1 g capsule Take 1 capsule (1 g total) by mouth 2 (two) times daily. 180 capsule 2   pantoprazole (PROTONIX) 40 MG tablet TAKE ONE TABLET BY MOUTH DAILY 90 tablet 2   rosuvastatin (CRESTOR) 20 MG tablet Take 1 tablet (20 mg total) by mouth daily. 90 tablet 3   tadalafil (CIALIS) 5 MG tablet Take 1 tablet (5 mg total) by mouth daily as needed for erectile dysfunction. 90 tablet 3   Testosterone 20.25 MG/ACT (1.62%) GEL APPLY 4 PUMPS TO THE SKIN DAILY AS DIRECTED 150 g 0   nitroGLYCERIN (NITROSTAT) 0.4 MG SL tablet Place  1 tablet (0.4 mg total) under the tongue every 5 (five) minutes as needed for chest pain. 90 tablet 3   No current facility-administered medications on file prior to visit.     The following portions of the patient's history were reviewed and updated as appropriate: allergies, current medications, past family history, past medical history, past social history, past surgical history and problem list.  ROS Otherwise as in subjective above  Objective: BP 110/70   Pulse 71   Wt 195 lb 3.2 oz (88.5 kg)   SpO2 97%   BMI 28.01 kg/m   Wt Readings from Last 3 Encounters:  05/11/22 195 lb 3.2 oz (88.5 kg)  01/11/22 205 lb 3.2 oz (93.1 kg)  10/19/21 202 lb 12.8 oz (92 kg)   BP Readings from Last 3 Encounters:  05/11/22 110/70   01/11/22 134/80  10/19/21 130/80    General appearance: alert, no distress, well developed, well nourished Skin: Left lateral deltoid with a 3 mm raised round pinkish lesion suggestive of a cherry hemangioma, similar lesion that is about 2 mm diameter upper abdomen, left upper back with a small patches pinkish coloration suggestive of irritated skin with no specific skin lesion, other scattered macules in general, no other worrisome lesions, chest central surgical scar   Assessment: Encounter Diagnoses  Name Primary?   Other fatigue Yes   Essential hypertension    Mixed dyslipidemia    High risk medication use    Hypogonadism in male    Low testosterone in male    Former smoker    Screening for lung cancer    Vaccine counseling    Cherry hemangioma    Skin lesion    OSA (obstructive sleep apnea)      Plan: Fatigue-discussed possible causes, labs as below today.  Discussed increasing exercise frequency  OSA-continue CPAP  Low testosterone, hypogonadism-continue testosterone therapy from updated labs today  Given history of former smoker with more than 30-pack-year history, referral for CT chest lung cancer screening  Skin lesions of concern-reassured about the cherry hemangiomas of his left lateral upper arm and upper abdomen.  He has a nonspecific finding on his left upper back suggestive of irritated skin.  Advised daily moisturizing lotion.  Consider baseline dermatology yearly for skin surveillance.  He will consider this but declines today  Hypertension-continue current medications  Dyslipidemia-continue current medications  Donald Oliver was seen today for med check.  Diagnoses and all orders for this visit:  Other fatigue -     TSH  Essential hypertension  Mixed dyslipidemia  High risk medication use -     Testosterone  Hypogonadism in male -     Testosterone  Low testosterone in male -     Testosterone  Former smoker -     CT CHEST LUNG CA SCREEN LOW  DOSE W/O CM; Future  Screening for lung cancer -     CT CHEST LUNG CA SCREEN LOW DOSE W/O CM; Future  Vaccine counseling  Cherry hemangioma  Skin lesion  OSA (obstructive sleep apnea)   Follow up: pending labs, physical in 09/2022

## 2022-05-12 LAB — TESTOSTERONE: Testosterone: 173 ng/dL — ABNORMAL LOW (ref 264–916)

## 2022-05-12 LAB — TSH: TSH: 3.18 u[IU]/mL (ref 0.450–4.500)

## 2022-05-12 NOTE — Progress Notes (Signed)
Results sent through MyChart

## 2022-06-01 ENCOUNTER — Telehealth: Payer: Self-pay | Admitting: Internal Medicine

## 2022-06-01 NOTE — Telephone Encounter (Signed)
Got a message from [place] in precert center. Pt has imaging scheduled for [id number] and need pre cert for his secondary medicaid. Not sure if you are working on this or not.  Please call Tonya back when done at [phone] ext [phone]

## 2022-06-02 ENCOUNTER — Ambulatory Visit (HOSPITAL_COMMUNITY)
Admission: RE | Admit: 2022-06-02 | Discharge: 2022-06-02 | Disposition: A | Discharge status: Home / Self Care | Payer: BC Managed Care – PPO | Source: Ambulatory Visit | Attending: Medical | Admitting: Medical

## 2022-06-02 DIAGNOSIS — Z87891 Personal history of nicotine dependence: Secondary | ICD-10-CM | POA: Insufficient documentation

## 2022-06-02 DIAGNOSIS — Z122 Encounter for screening for malignant neoplasm of respiratory organs: Secondary | ICD-10-CM | POA: Diagnosis not present

## 2022-06-04 NOTE — Progress Notes (Signed)
Results sent through MyChart

## 2022-06-09 DIAGNOSIS — G4733 Obstructive sleep apnea (adult) (pediatric): Secondary | ICD-10-CM | POA: Diagnosis not present

## 2022-06-10 ENCOUNTER — Other Ambulatory Visit: Payer: Self-pay | Admitting: Medical

## 2022-07-10 DIAGNOSIS — G4733 Obstructive sleep apnea (adult) (pediatric): Secondary | ICD-10-CM | POA: Diagnosis not present

## 2022-08-09 DIAGNOSIS — G4733 Obstructive sleep apnea (adult) (pediatric): Secondary | ICD-10-CM | POA: Diagnosis not present

## 2022-08-26 ENCOUNTER — Ambulatory Visit (INDEPENDENT_AMBULATORY_CARE_PROVIDER_SITE_OTHER): Payer: BC Managed Care – PPO | Admitting: Medical

## 2022-08-26 ENCOUNTER — Encounter: Payer: Self-pay | Admitting: Medical

## 2022-08-26 VITALS — BP 138/82 | HR 83 | Temp 98.4°F | Resp 14 | Wt 186.0 lb

## 2022-08-26 DIAGNOSIS — E782 Mixed hyperlipidemia: Secondary | ICD-10-CM

## 2022-08-26 DIAGNOSIS — D171 Benign lipomatous neoplasm of skin and subcutaneous tissue of trunk: Secondary | ICD-10-CM

## 2022-08-26 DIAGNOSIS — L299 Pruritus, unspecified: Secondary | ICD-10-CM | POA: Diagnosis not present

## 2022-08-26 MED ORDER — TRIAMCINOLONE ACETONIDE 0.1 % EX CREA: 1.0000 | TOPICAL_CREAM | Freq: Two times a day (BID) | CUTANEOUS | 0 refills | Status: DC

## 2022-08-26 MED ORDER — VASCEPA 1 G PO CAPS: 2.0000 g | ORAL_CAPSULE | Freq: Two times a day (BID) | ORAL | 3 refills | Status: DC

## 2022-08-26 NOTE — Progress Notes (Signed)
Subjective:  Donald Oliver is a 52 y.o. male who presents for Chief Complaint  Patient presents with   Mass    Reports having a large swollen spot on the left shoulder blade about the size of a softball. Noticed the mass 1 month ago.      Here for c/o swelling on back.  Has maybe been there close to a year, but girlfriend a month ago felt as though it was bigger or more noticeable.  Also gets itchy in the same area.  No other aggravating or relieving factors.    He notes that he has issues with his omega fish oil. Was on lovaza, but pharmacy gives other brands/suppliers and he doesn't tolerate them due to fish smell, burps, and horrible tastes.  No other c/o.  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 Laneta Simmers with LIMA to LAD, free RIMA to Diag and SVG to RCA     Combined hyperlipidemia    Depressed mood 07/19/2019   Eczema 09/05/2018   Elevated LFTs 05/24/2018   Encounter for health maintenance examination in adult 02/25/2015   Erectile dysfunction    Essential hypertension    Family history of premature CAD    Fatigue 05/24/2018   Fatty liver disease, nonalcoholic 05/24/2018   FH: premature coronary heart disease    Father had MI at age 20    Former smoker 08/29/2016   Gastroesophageal reflux disease 02/25/2015   GERD (gastroesophageal reflux disease)    History of substance abuse ([facility])    Pt had abused amphetamines : quit in 2005   Hypertension 2005   Impaired fasting blood sugar 2016   Keloid scar of skin 06/21/2016   Lipoma 07/19/2019   Low testosterone in male 05/24/2018   Metatarsalgia of right foot 06/21/2016   Mixed dyslipidemia 09/05/2018   Nail deformity 07/20/2015   Need for prophylactic vaccination against Streptococcus pneumoniae  (pneumococcus) 12/17/2015   Need for prophylactic vaccination and inoculation against influenza 12/17/2015   Onychomycosis    OSA on CPAP 10/31/2016   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 90 tablet 3   aspirin EC 81 MG tablet Take 1 tablet (81 mg total) by mouth daily. Swallow whole. 90 tablet 3   ezetimibe (ZETIA) 10 MG tablet Take 1 tablet (10 mg total) by mouth daily. 90 tablet 3   lisinopril-hydrochlorothiazide (ZESTORETIC) 20-25 MG tablet Take 1 tablet by mouth daily. 90 tablet 3   pantoprazole (PROTONIX) 40 MG tablet TAKE ONE TABLET BY MOUTH DAILY 90 tablet 2   rosuvastatin (CRESTOR) 20 MG tablet Take 1 tablet (20 mg total) by mouth daily. 90 tablet 3   tadalafil (CIALIS) 5 MG tablet Take 1 tablet (5 mg total) by mouth daily as needed for erectile dysfunction. 90 tablet 3   Testosterone 20.25 MG/ACT (1.62%) GEL APPLY 4 PUMP ACTUATIONS TOPICALLY DAILY AS DIRECTED 150 g 5   metoprolol succinate (TOPROL-XL) 25 MG 24 hr tablet Take 1 tablet (25 mg total) by mouth daily. (Patient not taking: Reported on 08/26/2022) 90 tablet 3   nitroGLYCERIN (NITROSTAT) 0.4 MG SL  tablet Place 1 tablet (0.4 mg total) under the tongue every 5 (five) minutes as needed for chest pain. 90 tablet 3   omega-3 acid ethyl esters (LOVAZA) 1 g capsule Take 1 capsule (1 g total) by mouth 2 (two) times daily. (Patient not taking: Reported on 08/26/2022) 180 capsule 2   No current facility-administered medications on file prior to visit.     The following portions of the patient's history were reviewed and updated as appropriate: allergies, current medications, past family history, past medical history, past social history, past surgical history and problem list.  ROS Otherwise as in subjective above    Objective: BP 138/82   Pulse 83   Temp 98.4 F (36.9 C) (Oral)    Resp 14   Wt 186 lb (84.4 kg)   SpO2 96%   BMI 26.69 kg/m   Wt Readings from Last 3 Encounters:  08/26/22 186 lb (84.4 kg)  05/11/22 195 lb 3.2 oz (88.5 kg)  01/11/22 205 lb 3.2 oz (93.1 kg)   General appearance: alert, no distress, well developed, well nourished Left upper back over scapular region with a raised broad round mass approx 9cm+ diameter.  Fullness, but no erythema, induration or fluctuance, nontender, no warmth Skin: unremarkable    Assessment: Encounter Diagnoses  Name Primary?   Lipoma of torso Yes   Itching    Mixed dyslipidemia      Plan: Lipoma - I called and reviewed his CT chest 05/2022 regarding lipoma findings, benign.  Nevertheless, it bothers him and he would like to have a surgical consult  Itching - continue daily moisturizing lotion but can use the kenalog cream prn for flare up  Mixed dyslipidemia, not tolerating lovaza or generics.  Change to trial of Vascepa   Donald Oliver was seen today for mass.  Diagnoses and all orders for this visit:  Lipoma of torso -     Ambulatory referral to General Surgery  Itching  Mixed dyslipidemia  Other orders -     triamcinolone cream (KENALOG) 0.1 %; Apply 1 Application topically 2 (two) times daily. -     VASCEPA 1 g capsule; Take 2 capsules (2 g total) by mouth 2 (two) times daily.   Follow up: prn

## 2022-08-27 ENCOUNTER — Telehealth: Payer: Self-pay | Admitting: Medical

## 2022-08-27 NOTE — Telephone Encounter (Signed)
P.A. PANTOPRAZOLE  

## 2022-09-09 DIAGNOSIS — G4733 Obstructive sleep apnea (adult) (pediatric): Secondary | ICD-10-CM | POA: Diagnosis not present

## 2022-09-17 NOTE — Telephone Encounter (Signed)
P.A. approved til 08/29/23, sent mychart message

## 2022-09-18 ENCOUNTER — Telehealth: Payer: Self-pay | Admitting: Medical

## 2022-09-18 NOTE — Telephone Encounter (Signed)
P.A. VASCEPA  °

## 2022-09-19 DIAGNOSIS — D171 Benign lipomatous neoplasm of skin and subcutaneous tissue of trunk: Secondary | ICD-10-CM | POA: Diagnosis not present

## 2022-09-23 NOTE — Telephone Encounter (Signed)
Additional questions answered for PA.

## 2022-09-24 NOTE — Telephone Encounter (Signed)
PA Case ID #: [id number] Rx #: [id number] Need Help? Call us at [phone] Outcome Approved on June 28 Approved. Authorization Expiration Date: 09/23/2023 Drug Vascepa 1GM capsules ePA cloud logo Form [employer] [place] Commercial Electronic Request Form Sent mychart message

## 2022-09-26 NOTE — Telephone Encounter (Signed)
Vascepa was also approved

## 2022-10-04 ENCOUNTER — Ambulatory Visit (INDEPENDENT_AMBULATORY_CARE_PROVIDER_SITE_OTHER): Payer: BC Managed Care – PPO | Admitting: Physician Assistant

## 2022-10-04 DIAGNOSIS — R2230 Localized swelling, mass and lump, unspecified upper limb: Secondary | ICD-10-CM | POA: Diagnosis not present

## 2022-10-04 NOTE — Progress Notes (Signed)
HPI: Donald Oliver is 52 year old male comes in today for left shoulder mass.  He has been told that he has a lipoma on his left shoulder.  He was seen in general surgery's office and was told he needed to see orthopedics.  He notes that the mass has been there for about 1 to 2 years.  He states he first noticed it after someone asking if he had scoliosis of his upper spine.  He does feel masses growing larger.  He had a CT scan without contrast dated 06/02/2022.  Shows a large mass posterior aspect left scapula.  No osseous lesions were noted.  Also noted was small solid pulmonary nodule right lower lobe measuring 4.6 mm and groundglass nodule of the left upper lobe measuring 6.8 mm. He notes no fevers chills.  Does note a 20 pound weight loss over the past 3 to 4 months but it has leveled off.  He feels this is due to eating eating less after going through a relationship break-up.  Does note that he has night sweats but this is normal for him.  Review of systems: See HPI otherwise negative or noncontributory.  Physical exam: General well-developed well-nourished male no acute distress mood and affect appropriate. Bilateral shoulders full range of motion without pain. Left scapula there is a large palpable mass posterior aspect of the scapula that measures approximately 9-1/2 cm x 11 mm.  Slight tenderness with palpation.  No erythema of the skin.  Impression: Left posterior shoulder mass  Plan: At this point time we will refer him to Dr. Toy Baker at [facility] in [place] for evaluation treatment of this mass.  Questions were encouraged and answered at length.  CT scan was reviewed with Dr. Magnus Ivan.

## 2022-10-05 ENCOUNTER — Other Ambulatory Visit: Payer: Self-pay

## 2022-10-05 DIAGNOSIS — R2230 Localized swelling, mass and lump, unspecified upper limb: Secondary | ICD-10-CM

## 2022-10-09 DIAGNOSIS — G4733 Obstructive sleep apnea (adult) (pediatric): Secondary | ICD-10-CM | POA: Diagnosis not present

## 2022-10-23 ENCOUNTER — Other Ambulatory Visit: Payer: Self-pay | Admitting: Medical

## 2022-10-24 ENCOUNTER — Ambulatory Visit (INDEPENDENT_AMBULATORY_CARE_PROVIDER_SITE_OTHER): Payer: BC Managed Care – PPO | Admitting: Medical

## 2022-10-24 ENCOUNTER — Encounter: Payer: Self-pay | Admitting: Medical

## 2022-10-24 VITALS — BP 110/64 | HR 86 | Ht 69.5 in | Wt 179.0 lb

## 2022-10-24 DIAGNOSIS — N529 Male erectile dysfunction, unspecified: Secondary | ICD-10-CM

## 2022-10-24 DIAGNOSIS — E782 Mixed hyperlipidemia: Secondary | ICD-10-CM

## 2022-10-24 DIAGNOSIS — R7301 Impaired fasting glucose: Secondary | ICD-10-CM

## 2022-10-24 DIAGNOSIS — K219 Gastro-esophageal reflux disease without esophagitis: Secondary | ICD-10-CM

## 2022-10-24 DIAGNOSIS — I10 Essential (primary) hypertension: Secondary | ICD-10-CM | POA: Diagnosis not present

## 2022-10-24 DIAGNOSIS — I2510 Atherosclerotic heart disease of native coronary artery without angina pectoris: Secondary | ICD-10-CM | POA: Diagnosis not present

## 2022-10-24 DIAGNOSIS — Z1211 Encounter for screening for malignant neoplasm of colon: Secondary | ICD-10-CM

## 2022-10-24 DIAGNOSIS — Z125 Encounter for screening for malignant neoplasm of prostate: Secondary | ICD-10-CM | POA: Diagnosis not present

## 2022-10-24 DIAGNOSIS — D179 Benign lipomatous neoplasm, unspecified: Secondary | ICD-10-CM

## 2022-10-24 DIAGNOSIS — R7989 Other specified abnormal findings of blood chemistry: Secondary | ICD-10-CM | POA: Diagnosis not present

## 2022-10-24 DIAGNOSIS — K760 Fatty (change of) liver, not elsewhere classified: Secondary | ICD-10-CM

## 2022-10-24 DIAGNOSIS — Z0000 Encounter for general adult medical examination without abnormal findings: Secondary | ICD-10-CM

## 2022-10-24 DIAGNOSIS — G4733 Obstructive sleep apnea (adult) (pediatric): Secondary | ICD-10-CM

## 2022-10-24 LAB — COMPREHENSIVE METABOLIC PANEL
ALT: 25 IU/L (ref 0–44)
AST: 23 IU/L (ref 0–40)
Albumin: 4.5 g/dL (ref 3.8–4.9)
Alkaline Phosphatase: 83 IU/L (ref 44–121)
BUN/Creatinine Ratio: 11 (ref 9–20)
BUN: 11 mg/dL (ref 6–24)
Bilirubin Total: 0.9 mg/dL (ref 0.0–1.2)
CO2: 25 mmol/L (ref 20–29)
Calcium: 9.6 mg/dL (ref 8.7–10.2)
Chloride: 101 mmol/L (ref 96–106)
Creatinine, Ser: 1.02 mg/dL (ref 0.76–1.27)
Globulin, Total: 2.8 g/dL (ref 1.5–4.5)
Glucose: 108 mg/dL — ABNORMAL HIGH (ref 70–99)
Potassium: 4.2 mmol/L (ref 3.5–5.2)
Sodium: 142 mmol/L (ref 134–144)
Total Protein: 7.3 g/dL (ref 6.0–8.5)
eGFR: 88 mL/min/{1.73_m2} (ref >59)

## 2022-10-24 LAB — HEMOGLOBIN A1C
Est. average glucose Bld gHb Est-mCnc: 114 mg/dL
Hgb A1c MFr Bld: 5.6 % (ref 4.8–5.6)

## 2022-10-24 LAB — LIPID PANEL
Chol/HDL Ratio: 2.1 ratio (ref 0.0–5.0)
Cholesterol, Total: 109 mg/dL (ref 100–199)
HDL: 51 mg/dL (ref >39)
LDL Chol Calc (NIH): 40 mg/dL (ref 0–99)
Triglycerides: 98 mg/dL (ref 0–149)
VLDL Cholesterol Cal: 18 mg/dL (ref 5–40)

## 2022-10-24 LAB — TESTOSTERONE

## 2022-10-24 LAB — CBC
Hematocrit: 53 % — ABNORMAL HIGH (ref 37.5–51.0)
Hemoglobin: 17.2 g/dL (ref 13.0–17.7)
MCH: 28 pg (ref 26.6–33.0)
MCHC: 32.5 g/dL (ref 31.5–35.7)
MCV: 86 fL (ref 79–97)
Platelets: 319 10*3/uL (ref 150–450)
RBC: 6.14 x10E6/uL — ABNORMAL HIGH (ref 4.14–5.80)
RDW: 13.7 % (ref 11.6–15.4)
WBC: 8.6 10*3/uL (ref 3.4–10.8)

## 2022-10-24 NOTE — Progress Notes (Signed)
Subjective:   HPI  Donald Oliver is a 52 y.o. male who presents for Chief Complaint  Patient presents with   Annual Exam    Fasting cpe, no concerns    Patient Care Team: Mackenzye Mackel, Cleda Mccreedy as PCP - General (Family Medicine) Sees dentist Sees eye doctor Dr. Norman Herrlich, cardiology Dr. Cristie Hem, podiatry Dr. Dillard Cannon, ENT   Concerns: Here fasting for labs.  Compliant with medications  Uses 4 pumps of testosterone daily.  Feels flushed in face sometimes.  Donates blood regularly  Uses supplements, Vitamin K, Zn, multivitamin, Vit D, coenzyme q10.  Uses Cialis 5mg  daily for BPH and ED.  Sometimes forgets to take it  Still vapes tobacco  No other complaint  Reviewed their medical, surgical, family, social, medication, and allergy history and updated chart as appropriate.  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 Laneta Simmers with LIMA to LAD, free RIMA to Diag and SVG to RCA     Combined hyperlipidemia    Depressed mood 07/19/2019   Eczema 09/05/2018   Elevated LFTs 05/24/2018   Encounter for health maintenance examination in adult 02/25/2015   Erectile dysfunction    Essential hypertension    Family history of premature CAD    Fatigue 05/24/2018   Fatty liver disease, nonalcoholic 05/24/2018   FH: premature coronary heart disease    Father had MI at age 33    Former smoker 08/29/2016   Gastroesophageal reflux disease 02/25/2015   GERD (gastroesophageal reflux disease)    History of substance abuse (HCC)    Pt had abused amphetamines : quit in 2005   Hypertension 2005   Impaired fasting blood sugar 2016   Keloid scar of skin 06/21/2016   Lipoma 07/19/2019   Low testosterone in male 05/24/2018   Metatarsalgia of right foot  06/21/2016   Mixed dyslipidemia 09/05/2018   Nail deformity 07/20/2015   Need for prophylactic vaccination against Streptococcus pneumoniae (pneumococcus) 12/17/2015   Need for prophylactic vaccination and inoculation against influenza 12/17/2015   Onychomycosis    OSA on CPAP 10/31/2016   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    Past Surgical History:  Procedure Laterality Date   CARDIAC CATHETERIZATION N/A 05/29/2015   Procedure: Left Heart Cath and Coronary Angiography;  Surgeon: Corky Crafts, MD;  Location: [facility] INVASIVE CV LAB;  Service: Cardiovascular;  Laterality: N/A;   CARDIAC CATHETERIZATION  05/29/2015   Procedure: Coronary Balloon Angioplasty;  Surgeon: Corky Crafts, MD;  Location: [facility] INVASIVE CV LAB;  Service: Cardiovascular;;   CORONARY ARTERY BYPASS GRAFT N/A 06/02/2015   Procedure: CORONARY ARTERY BYPASS GRAFTING (CABG) x  3 utilizing bilateral internal mammary artery and endoscopically harvested right sapheneous vein.;  Surgeon: Alleen Borne, MD;  Location: [facility] OR;  Service: Open Heart Surgery;  Laterality: N/A;   RHINOPLASTY  20065   w/repair of nasal fractuare; done at [facility]    TEE WITHOUT CARDIOVERSION N/A 06/02/2015   Procedure: TRANSESOPHAGEAL ECHOCARDIOGRAM (TEE);  Surgeon: Alleen Borne, MD;  Location: [facility] OR;  Service: Open Heart Surgery;  Laterality: N/A;    Family History  Problem Relation Age of Onset   Heart failure Father    Heart disease Father 19  died of MI   Diabetes Father    Heart disease Mother        CAD, stents   Diabetes Mother    HIV Sister    Cancer Maternal Aunt        lung, smoker   Stroke Neg Hx      Current Outpatient Medications:    amLODipine (NORVASC) 10 MG tablet, Take 1 tablet (10 mg total) by mouth daily., Disp: 90 tablet, Rfl: 3   ezetimibe (ZETIA) 10 MG tablet, Take 1 tablet (10 mg total) by mouth daily., Disp: 90 tablet, Rfl: 3   lisinopril-hydrochlorothiazide (ZESTORETIC) 20-25 MG tablet, Take 1 tablet by mouth daily., Disp:  90 tablet, Rfl: 3   tadalafil (CIALIS) 5 MG tablet, Take 1 tablet (5 mg total) by mouth daily as needed for erectile dysfunction., Disp: 90 tablet, Rfl: 3   Testosterone 20.25 MG/ACT (1.62%) GEL, APPLY 4 PUMP ACTUATIONS TOPICALLY DAILY AS DIRECTED, Disp: 150 g, Rfl: 5   triamcinolone cream (KENALOG) 0.1 %, Apply 1 Application topically 2 (two) times daily., Disp: 453 g, Rfl: 0   VASCEPA 1 g capsule, Take 2 capsules (2 g total) by mouth 2 (two) times daily., Disp: 120 capsule, Rfl: 3   ASPIRIN ADULT LOW DOSE 81 MG tablet, TAKE 1 TABLET BY MOUTH DAILY *SWALLOW WHOLE*, Disp: 90 tablet, Rfl: 3   nitroGLYCERIN (NITROSTAT) 0.4 MG SL tablet, Place 1 tablet (0.4 mg total) under the tongue every 5 (five) minutes as needed for chest pain., Disp: 90 tablet, Rfl: 3   pantoprazole (PROTONIX) 40 MG tablet, TAKE 1 TABLET BY MOUTH DAILY, Disp: 90 tablet, Rfl: 2   rosuvastatin (CRESTOR) 20 MG tablet, TAKE 1 TABLET BY MOUTH DAILY, Disp: 90 tablet, Rfl: 3  Allergies  Allergen Reactions   Amoxicillin Swelling    Hand swelling - as a teenager Has patient had a PCN reaction causing immediate rash, facial/tongue/throat swelling, SOB or lightheadedness with hypotension: Yes Has patient had a PCN reaction causing severe rash involving mucus membranes or skin necrosis: No Has patient had a PCN reaction that required hospitalization No Has patient had a PCN reaction occurring within the last 10 years: No If all of the above answers are "NO", then may proceed with Cephalosporin use. Hand swelling - as a teenager Has patient had a PCN reaction causing immediate rash, facial/tongue/throat swelling, SOB or lightheadedness with hypotension: Yes Has patient had a PCN reaction causing severe rash involving mucus membranes or skin necrosis: No Has patient had a PCN reaction that required hospitalization No Has patient had a PCN reaction occurring within the last 10 years: No If all of the above answers are "NO", then may  proceed with Cephalosporin use.   Fluoxetine Other (See Comments)    Cloudy mentation, sexual side effects  Cloudy mentation, sexual side effects     Review of Systems  Constitutional:  Negative for chills, fever, malaise/fatigue and weight loss.  HENT:  Negative for congestion, ear pain, hearing loss, sore throat and tinnitus.   Eyes:  Negative for blurred vision, pain and redness.  Respiratory:  Negative for cough, hemoptysis and shortness of breath.   Cardiovascular:  Negative for chest pain, palpitations, orthopnea, claudication and leg swelling.  Gastrointestinal:  Negative for abdominal pain, blood in stool, constipation, diarrhea, nausea and vomiting.  Genitourinary:  Negative for dysuria, flank pain, frequency, hematuria and urgency.  Musculoskeletal:  Negative for falls, joint pain and myalgias.  Skin:  Negative for itching and rash.  Neurological:  Negative for dizziness, tingling, speech change, weakness and headaches.  Endo/Heme/Allergies:  Negative for polydipsia. Does not bruise/bleed easily.  Psychiatric/Behavioral:  Negative for depression and memory loss. The patient is not nervous/anxious and does not have insomnia.         10/24/2022    8:36 AM 05/11/2022    9:49 AM 10/19/2021    1:21 PM 10/08/2020    9:34 AM 07/09/2020   12:35 PM  Depression screen PHQ 2/9  Decreased Interest 0 0 0 1 2  Down, Depressed, Hopeless 0 0 0 1 3  PHQ - 2 Score 0 0 0 2 5  Altered sleeping    2 2  Tired, decreased energy    1 2  Change in appetite    0 2  Feeling bad or failure about yourself     1 2  Trouble concentrating    0 2  Moving slowly or fidgety/restless    0 1  Suicidal thoughts    0 0  PHQ-9 Score    6 16  Difficult doing work/chores    Somewhat difficult Somewhat difficult       Objective:  BP 110/64   Pulse 86   Ht 5' 9.5" (1.765 m)   Wt 179 lb (81.2 kg)   BMI 26.05 kg/m   Wt Readings from Last 3 Encounters:  10/24/22 179 lb (81.2 kg)  08/26/22 186 lb (84.4  kg)  05/11/22 195 lb 3.2 oz (88.5 kg)   BP Readings from Last 3 Encounters:  10/24/22 110/64  08/26/22 138/82  05/11/22 110/70    General appearance: alert, no distress, WD/WN, Caucasian male Skin: Left upper back over scapular region with a raised broad round mass approx 9cm+ diameter. Fullness, but no erythema, induration or fluctuance, nontender, no warmth  HEENT: normocephalic, conjunctiva/corneas normal, sclerae anicteric, PERRLA, EOMi Neck: supple, no lymphadenopathy, no thyromegaly, no masses, normal ROM, no bruits Chest: non tender, normal shape and expansion Heart: RRR, normal S1, S2, no murmurs Lungs: vertical CABG scar with keloid, CTA bilaterally, no wheezes, rhonchi, or rales Abdomen: +bs, soft, non tender, non distended, no masses, no hepatomegaly, no splenomegaly, no bruits Back: non tender, normal ROM, no scoliosis Musculoskeletal: upper extremities non tender, no obvious deformity, normal ROM throughout, lower extremities non tender, no obvious deformity, normal ROM throughout Extremities: no edema, no cyanosis, no clubbing Pulses: 2+ symmetric, upper and lower extremities, normal cap refill Neurological: alert, oriented x 3, CN2-12 intact, strength normal upper extremities and lower extremities, sensation normal throughout, DTRs 2+ throughout, no cerebellar signs, gait normal Psychiatric: normal affect, behavior normal, pleasant  GU/rectal - deferred, declined   Assessment and Plan :   Encounter Diagnoses  Name Primary?   Encounter for health maintenance examination in adult Yes   Coronary artery disease involving native coronary artery of native heart without angina pectoris    Erectile dysfunction, unspecified erectile dysfunction type    Essential hypertension    Fatty liver disease, nonalcoholic    OSA on CPAP    Mixed dyslipidemia    Lipoma, unspecified site    Low testosterone in male    Impaired fasting blood sugar    Gastroesophageal reflux disease,  unspecified whether esophagitis present    Screening for prostate cancer    Screen for colon cancer      This visit was a preventative care visit, also known as wellness visit or routine physical.   Topics typically include healthy lifestyle, diet, exercise, preventative care, vaccinations, sick and  well care, proper use of emergency dept and after hours care, as well as other concerns.     Recommendations: Continue to return yearly for your annual wellness and preventative care visits.  This gives [place] a chance to discuss healthy lifestyle, exercise, vaccinations, review your chart record, and perform screenings where appropriate.  I recommend you see your eye doctor yearly for routine vision care.  I recommend you see your dentist yearly for routine dental care including hygiene visits twice yearly.   Vaccination recommendations were reviewed Immunization History  Administered Date(s) Administered   Influenza Split 01/28/2015   Influenza,inj,Quad PF,6+ Mos 12/17/2015   Influenza-Unspecified 01/28/2015, 02/12/2017   Pneumococcal Polysaccharide-23 12/17/2015, 07/09/2020   Tdap 04/01/2015    Shingles vaccine:  I recommend you have a shingles vaccine to help prevent shingles or herpes zoster outbreak.   Please call your insurer to inquire about coverage for the Shingrix vaccine given in 2 doses.   Some insurers cover this vaccine after age 40, some cover this after age 43.  If your insurer covers this, then call to schedule appointment to have this vaccine here.  I recommend a yearly flu shot in the fall   Screening for cancer: Colon cancer screening: I reviewed your Cologuard from 2022 that was negative . Plan to repeat this in 2025.    We discussed PSA, prostate exam, and prostate cancer screening risks/benefits.     Skin cancer screening: Check your skin regularly for new changes, growing lesions, or other lesions of concern Come in for evaluation if you have skin lesions of  concern.  Lung cancer screening: If you have a greater than 20 pack year history of tobacco use, then you may qualify for lung cancer screening with a chest CT scan.   Please call your insurance company to inquire about coverage for this test.  We currently don't have screenings for other cancers besides breast, cervical, colon, and lung cancers.  If you have a strong family history of cancer or have other cancer screening concerns, please let me know.    Bone health: Get at least 150 minutes of aerobic exercise weekly Get weight bearing exercise at least once weekly Bone density test:  A bone density test is an imaging test that uses a type of X-ray to measure the amount of calcium and other minerals in your bones. The test may be used to diagnose or screen you for a condition that causes weak or thin bones (osteoporosis), predict your risk for a broken bone (fracture), or determine how well your osteoporosis treatment is working. The bone density test is recommended for females 65 and older, or females or males <65 if certain risk factors such as thyroid disease, long term use of steroids such as for asthma or rheumatological issues, vitamin D deficiency, estrogen deficiency, family history of osteoporosis, self or family history of fragility fracture in first degree relative.    Heart health: Get at least 150 minutes of aerobic exercise weekly Limit alcohol It is important to maintain a healthy blood pressure and healthy cholesterol numbers  Continue to see your cardiologist regularly   Medical care options: I recommend you continue to seek care here first for routine care.  We try really hard to have available appointments Monday through Friday daytime hours for sick visits, acute visits, and physicals.  Urgent care should be used for after hours and weekends for significant issues that cannot wait till the next day.  The emergency department should be used  for significant potentially  life-threatening emergencies.  The emergency department is expensive, can often have long wait times for less significant concerns, so try to utilize primary care, urgent care, or telemedicine when possible to avoid unnecessary trips to the emergency department.  Virtual visits and telemedicine have been introduced since the pandemic started in 2020, and can be convenient ways to receive medical care.  We offer virtual appointments as well to assist you in a variety of options to seek medical care.    Separate significant issues discussed: Lung cancer screening - CT chest 06/02/22, aortic atherosclerosis, benign, no cancer  Tobacco use - vapes.   Sleep apnea-continue CPAP  Continue to see cardiology regularly.  History of heart disease, CABG, hypertension.  01/28/22 stress echo, negative for ischemia  Mixed dyslipidemia-labs today, continue Zetia, Crestor, vascepa   Low testosterone - compliant with testosterone gel 4 pumps daily  ED, BPH - Continue Cialis  Hypertension-continue current medications, Lisinopril HCT 20/25mg  daily, amlodipine 10mg  daily  Lipoma of back, complicated - saw general surgery and ortho recently , was referred to 3rd subspecialist in [facility]" was seen today for annual exam.  Diagnoses and all orders for this visit:  Encounter for health maintenance examination in adult -     Comprehensive metabolic panel -     CBC -     Lipid panel -     Testosterone -     Hemoglobin A1c -     Cancel: POCT Urinalysis DIP (Proadvantage Device)  Coronary artery disease involving native coronary artery of native heart without angina pectoris  Erectile dysfunction, unspecified erectile dysfunction type  Essential hypertension -     Cancel: POCT Urinalysis DIP (Proadvantage Device)  Fatty liver disease, nonalcoholic  OSA on CPAP  Mixed dyslipidemia -     Lipid panel  Lipoma, unspecified site  Low testosterone in male -     Testosterone  Impaired  fasting blood sugar -     Hemoglobin A1c  Gastroesophageal reflux disease, unspecified whether esophagitis present  Screening for prostate cancer -     PSA  Screen for colon cancer -     Cologuard    Follow-up pending labs, yearly for physical

## 2022-10-25 ENCOUNTER — Other Ambulatory Visit: Payer: Self-pay | Admitting: Medical

## 2022-10-25 MED ORDER — TADALAFIL 5 MG PO TABS: 5.0000 mg | ORAL_TABLET | Freq: Every day | ORAL | 3 refills | Status: DC | PRN

## 2022-10-25 NOTE — Progress Notes (Signed)
Results sent through MyChart

## 2022-11-07 DIAGNOSIS — R2230 Localized swelling, mass and lump, unspecified upper limb: Secondary | ICD-10-CM | POA: Diagnosis not present

## 2022-11-07 DIAGNOSIS — D1722 Benign lipomatous neoplasm of skin and subcutaneous tissue of left arm: Secondary | ICD-10-CM | POA: Insufficient documentation

## 2022-11-07 HISTORY — DX: Localized swelling, mass and lump, unspecified upper limb: R22.30

## 2022-11-07 HISTORY — DX: Benign lipomatous neoplasm of skin and subcutaneous tissue of left arm: D17.22

## 2022-11-17 DIAGNOSIS — G8918 Other acute postprocedural pain: Secondary | ICD-10-CM | POA: Diagnosis not present

## 2022-11-17 DIAGNOSIS — D1779 Benign lipomatous neoplasm of other sites: Secondary | ICD-10-CM | POA: Diagnosis not present

## 2022-11-17 DIAGNOSIS — D1722 Benign lipomatous neoplasm of skin and subcutaneous tissue of left arm: Secondary | ICD-10-CM | POA: Diagnosis not present

## 2022-11-22 DIAGNOSIS — Z4789 Encounter for other orthopedic aftercare: Secondary | ICD-10-CM | POA: Diagnosis not present

## 2022-11-25 ENCOUNTER — Ambulatory Visit: Payer: BC Managed Care – PPO | Admitting: Nurse Practitioner

## 2022-11-25 ENCOUNTER — Encounter: Payer: Self-pay | Admitting: Nurse Practitioner

## 2022-11-25 VITALS — BP 118/74 | HR 68 | Wt 175.2 lb

## 2022-11-25 DIAGNOSIS — Z5189 Encounter for other specified aftercare: Secondary | ICD-10-CM | POA: Diagnosis not present

## 2022-11-25 NOTE — Progress Notes (Signed)
CMA changed bandage for patient. No additional concerns present.

## 2022-12-01 ENCOUNTER — Ambulatory Visit (INDEPENDENT_AMBULATORY_CARE_PROVIDER_SITE_OTHER): Payer: BC Managed Care – PPO | Admitting: Medical

## 2022-12-01 VITALS — BP 138/84 | HR 80 | Temp 97.9°F | Wt 168.6 lb

## 2022-12-01 DIAGNOSIS — R634 Abnormal weight loss: Secondary | ICD-10-CM

## 2022-12-01 DIAGNOSIS — R110 Nausea: Secondary | ICD-10-CM | POA: Diagnosis not present

## 2022-12-01 DIAGNOSIS — R61 Generalized hyperhidrosis: Secondary | ICD-10-CM

## 2022-12-01 DIAGNOSIS — R6883 Chills (without fever): Secondary | ICD-10-CM | POA: Diagnosis not present

## 2022-12-01 DIAGNOSIS — R5381 Other malaise: Secondary | ICD-10-CM

## 2022-12-01 DIAGNOSIS — R4589 Other symptoms and signs involving emotional state: Secondary | ICD-10-CM

## 2022-12-01 DIAGNOSIS — Z113 Encounter for screening for infections with a predominantly sexual mode of transmission: Secondary | ICD-10-CM

## 2022-12-01 LAB — URINALYSIS, ROUTINE W REFLEX MICROSCOPIC
Bilirubin, UA: NEGATIVE
Glucose, UA: NEGATIVE
Ketones, UA: NEGATIVE
Leukocytes,UA: NEGATIVE
Nitrite, UA: NEGATIVE
Protein,UA: NEGATIVE
RBC, UA: NEGATIVE
Specific Gravity, UA: 1.02 (ref 1.005–1.030)
Urobilinogen, Ur: 0.2 mg/dL (ref 0.2–1.0)
pH, UA: 7 (ref 5.0–7.5)

## 2022-12-01 MED ORDER — ONDANSETRON HCL 4 MG PO TABS: 4.0000 mg | ORAL_TABLET | Freq: Three times a day (TID) | ORAL | 0 refills | Status: DC | PRN

## 2022-12-01 MED ORDER — BUPROPION HCL ER (XL) 300 MG PO TB24: 300.0000 mg | ORAL_TABLET | Freq: Every day | ORAL | 0 refills | Status: DC

## 2022-12-01 MED ORDER — BUPROPION HCL ER (XL) 150 MG PO TB24: 150.0000 mg | ORAL_TABLET | Freq: Every day | ORAL | 0 refills | Status: DC

## 2022-12-01 NOTE — Progress Notes (Signed)
Subjective:  Donald Oliver is a 52 y.o. male who presents for Chief Complaint  Patient presents with   not feeling well    Not feeling well. Had a tumor removed 2 weeks ago and since then, nauseated, sleeping 10-12 hours, cough, sweating, fatigue, depressed, weight loss, not able to function     Here for not feeling well  He notes he wasn't feeling great before his recent lipoma surgery.  Just had large lipoma removed from left upper back 2 weeks ago.    For past several days feels nauseated, sweating a lot, feeling sleepy, hard time functioning, feeling down in mood, some cough, some runny nose ever since his recent surgery.  Not feeling well.  Wonders about depression in general.  Wants to get back on Wellbutrin  He notes loose stool some but not having daily BM.   No known C diff exposure, no sick contacts.   Concerned about STD screen, recent partner not sure about their status.  No other aggravating or relieving factors.    No other c/o.  Review of Systems  Constitutional:  Positive for chills, malaise/fatigue and weight loss. Negative for fever.  HENT:  Negative for congestion, ear pain, hearing loss, sore throat and tinnitus.   Eyes:  Negative for blurred vision, pain and redness.  Respiratory:  Positive for cough. Negative for hemoptysis and shortness of breath.   Cardiovascular:  Negative for chest pain, palpitations, orthopnea and leg swelling.  Gastrointestinal:  Positive for diarrhea and nausea. Negative for abdominal pain, blood in stool, constipation and vomiting.  Genitourinary:  Negative for dysuria, flank pain, frequency, hematuria and urgency.  Musculoskeletal:  Negative for falls, joint pain and myalgias.  Skin:  Negative for itching and rash.  Neurological:  Positive for dizziness and weakness. Negative for tingling, speech change and headaches.  Endo/Heme/Allergies:  Negative for polydipsia. Does not bruise/bleed easily.  Psychiatric/Behavioral:  Positive for  depression. Negative for memory loss. The patient is not nervous/anxious and does not have insomnia.    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 Laneta Simmers with LIMA to LAD, free RIMA to Diag and SVG to RCA     Combined hyperlipidemia    Depressed mood 07/19/2019   Eczema 09/05/2018   Elevated LFTs 05/24/2018   Encounter for health maintenance examination in adult 02/25/2015   Erectile dysfunction    Essential hypertension    Family history of premature CAD    Fatigue 05/24/2018   Fatty liver disease, nonalcoholic 05/24/2018   FH: premature coronary heart disease    Father had MI at age 99    Former smoker 08/29/2016   Gastroesophageal reflux disease 02/25/2015   GERD (gastroesophageal reflux disease)    History of substance abuse (HCC)    Pt had abused amphetamines : quit in 2005   Hypertension 2005   Impaired fasting blood sugar 2016   Keloid scar of skin 06/21/2016   Lipoma 07/19/2019   Low testosterone in male 05/24/2018   Metatarsalgia of right foot 06/21/2016   Mixed dyslipidemia 09/05/2018   Nail deformity 07/20/2015   Need for prophylactic vaccination against Streptococcus pneumoniae (pneumococcus) 12/17/2015   Need for prophylactic vaccination and inoculation against influenza 12/17/2015   Onychomycosis    OSA on CPAP  10/31/2016   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 90 tablet 3   ASPIRIN ADULT LOW DOSE 81 MG tablet TAKE 1 TABLET BY MOUTH DAILY *SWALLOW WHOLE* 90 tablet 3   ezetimibe (ZETIA) 10 MG tablet Take 1 tablet (10 mg total) by mouth daily. 90 tablet 3   lisinopril-hydrochlorothiazide (ZESTORETIC)  20-25 MG tablet Take 1 tablet by mouth daily. 90 tablet 3   pantoprazole (PROTONIX) 40 MG tablet TAKE 1 TABLET BY MOUTH DAILY 90 tablet 2   rosuvastatin (CRESTOR) 20 MG tablet TAKE 1 TABLET BY MOUTH DAILY 90 tablet 3   tadalafil (CIALIS) 5 MG tablet Take 1 tablet (5 mg total) by mouth daily as needed for erectile dysfunction. 90 tablet 3   Testosterone 20.25 MG/ACT (1.62%) GEL APPLY 4 PUMP ACTUATIONS TOPICALLY DAILY AS DIRECTED 150 g 5   VASCEPA 1 g capsule Take 2 capsules (2 g total) by mouth 2 (two) times daily. 120 capsule 3   nitroGLYCERIN (NITROSTAT) 0.4 MG SL tablet Place 1 tablet (0.4 mg total) under the tongue every 5 (five) minutes as needed for chest pain. 90 tablet 3   triamcinolone cream (KENALOG) 0.1 % Apply 1 Application topically 2 (two) times daily. (Patient not taking: Reported on 12/01/2022) 453 g 0   No current facility-administered medications on file prior to visit.     The following portions of the patient's history were reviewed and updated as appropriate: allergies, current medications, past family history, past medical history, past social history, past surgical history and problem list.  ROS Otherwise as in subjective above   Objective: BP 138/84   Pulse 80   Temp 97.9 F (36.6 C)   Wt 168 lb 9.6 oz (76.5 kg)   BMI 24.54 kg/m   Wt Readings from Last 3 Encounters:  12/01/22 168 lb 9.6 oz (76.5 kg)  11/25/22 175 lb 3.2 oz (79.5 kg)  10/24/22 179 lb (81.2 kg)   General appearance: alert, no distress, well developed, well nourished Left upper back with long linear surgical wound without warmth, erythema, induration or signs of infection.  Sutures and wound intact HEENT: normocephalic, sclerae anicteric, conjunctiva pink and moist, TMs pearly, nares patent, no discharge or erythema, pharynx normal Oral cavity: MMM, no lesions Chest surgical scars noted from prior CABG Neck: supple, no lymphadenopathy, no thyromegaly, no masses Heart: RRR, normal S1, S2, no  murmurs Lungs: CTA bilaterally, no wheezes, rhonchi, or rales Abdomen: +bs, soft, non tender, non distended, no masses, no hepatomegaly, no splenomegaly Pulses: 2+ radial pulses, 2+ pedal pulses, normal cap refill Ext: no edema   Assessment: Encounter Diagnoses  Name Primary?   Malaise Yes   Chills    Sweating increase    Depressed mood    Unexplained weight loss    Nausea    Screen for STD (sexually transmitted disease)      Plan: Discussed symptoms, wide possible differential s/p recent surgery 2 weeks ago  Labs as below to eval for infection, sepsis, UTI, other  He has been having worse depressed mood and wants to restart Wellbutrin.  Restart as below, ramp up to 300mg  after a week.  STD screen as well today per his request  Radwan "Susy Frizzle" was seen today for not feeling well.  Diagnoses and all orders for this visit:  Malaise -  CBC with Differential/Platelet -     Culture, blood (single) w Reflex to ID Panel -     Cancel: POCT Urinalysis DIP (Proadvantage Device) -     TSH + free T4 -     Basic metabolic panel -     Procalcitonin -     C-reactive protein -     Urinalysis, Routine w reflex microscopic  Chills -     CBC with Differential/Platelet -     Culture, blood (single) w Reflex to ID Panel -     Cancel: POCT Urinalysis DIP (Proadvantage Device) -     TSH + free T4 -     Basic metabolic panel -     Procalcitonin -     C-reactive protein -     Urinalysis, Routine w reflex microscopic  Sweating increase -     CBC with Differential/Platelet -     Culture, blood (single) w Reflex to ID Panel -     Cancel: POCT Urinalysis DIP (Proadvantage Device) -     TSH + free T4 -     Basic metabolic panel -     Procalcitonin -     C-reactive protein  Depressed mood  Unexplained weight loss -     CBC with Differential/Platelet -     Culture, blood (single) w Reflex to ID Panel -     Cancel: POCT Urinalysis DIP (Proadvantage Device) -     TSH + free T4 -      Basic metabolic panel -     Procalcitonin -     C-reactive protein -     Cancel: Urinalysis, Routine w reflex microscopic  Nausea -     CBC with Differential/Platelet -     Culture, blood (single) w Reflex to ID Panel -     Cancel: POCT Urinalysis DIP (Proadvantage Device) -     TSH + free T4 -     Basic metabolic panel -     Procalcitonin -     C-reactive protein -     Cancel: Urinalysis, Routine w reflex microscopic -     Urinalysis, Routine w reflex microscopic  Screen for STD (sexually transmitted disease) -     RPR+HIV+GC+CT Panel -     Cancel: Urinalysis, Routine w reflex microscopic -     Urinalysis, Routine w reflex microscopic  Other orders -     buPROPion (WELLBUTRIN XL) 150 MG 24 hr tablet; Take 1 tablet (150 mg total) by mouth daily. -     buPROPion (WELLBUTRIN XL) 300 MG 24 hr tablet; Take 1 tablet (300 mg total) by mouth daily. -     ondansetron (ZOFRAN) 4 MG tablet; Take 1 tablet (4 mg total) by mouth every 8 (eight) hours as needed for nausea or vomiting.    Follow up: pending labs

## 2022-12-02 LAB — CBC WITH DIFFERENTIAL/PLATELET
Basophils Absolute: 0 10*3/uL (ref 0.0–0.2)
Basos: 0 %
EOS (ABSOLUTE): 0.1 10*3/uL (ref 0.0–0.4)
Eos: 1 %
Hematocrit: 53 % — ABNORMAL HIGH (ref 37.5–51.0)
Hemoglobin: 17.5 g/dL (ref 13.0–17.7)
Immature Grans (Abs): 0.1 10*3/uL (ref 0.0–0.1)
Immature Granulocytes: 1 %
Lymphocytes Absolute: 2 10*3/uL (ref 0.7–3.1)
Lymphs: 18 %
MCH: 28.1 pg (ref 26.6–33.0)
MCHC: 33 g/dL (ref 31.5–35.7)
MCV: 85 fL (ref 79–97)
Monocytes Absolute: 1 10*3/uL — ABNORMAL HIGH (ref 0.1–0.9)
Monocytes: 9 %
Neutrophils Absolute: 8.2 10*3/uL — ABNORMAL HIGH (ref 1.4–7.0)
Neutrophils: 71 %
Platelets: 428 10*3/uL (ref 150–450)
RBC: 6.23 x10E6/uL — ABNORMAL HIGH (ref 4.14–5.80)
RDW: 15.3 % (ref 11.6–15.4)
WBC: 11.5 10*3/uL — ABNORMAL HIGH (ref 3.4–10.8)

## 2022-12-02 LAB — BASIC METABOLIC PANEL
BUN/Creatinine Ratio: 17 (ref 9–20)
BUN: 15 mg/dL (ref 6–24)
CO2: 20 mmol/L (ref 20–29)
Calcium: 9.5 mg/dL (ref 8.7–10.2)
Chloride: 99 mmol/L (ref 96–106)
Creatinine, Ser: 0.89 mg/dL (ref 0.76–1.27)
Glucose: 86 mg/dL (ref 70–99)
Potassium: 3.7 mmol/L (ref 3.5–5.2)
Sodium: 140 mmol/L (ref 134–144)
eGFR: 102 mL/min/{1.73_m2} (ref >59)

## 2022-12-02 LAB — TSH+FREE T4
Free T4: 1.39 ng/dL (ref 0.82–1.77)
TSH: 4.45 u[IU]/mL (ref 0.450–4.500)

## 2022-12-02 LAB — PROCALCITONIN: Procalcitonin: 0.02 ng/mL (ref 0.00–0.08)

## 2022-12-02 LAB — C-REACTIVE PROTEIN: CRP: 3 mg/L (ref 0–10)

## 2022-12-02 NOTE — Progress Notes (Signed)
Results sent through MyChart

## 2022-12-03 LAB — RPR+HIV+GC+CT PANEL
Chlamydia trachomatis, NAA: NEGATIVE
HIV Screen 4th Generation wRfx: NONREACTIVE
Neisseria Gonorrhoeae by PCR: NEGATIVE
RPR Ser Ql: NONREACTIVE

## 2022-12-05 NOTE — Progress Notes (Signed)
Results sent through MyChart

## 2022-12-06 DIAGNOSIS — Z4789 Encounter for other orthopedic aftercare: Secondary | ICD-10-CM | POA: Diagnosis not present

## 2022-12-06 DIAGNOSIS — M9905 Segmental and somatic dysfunction of pelvic region: Secondary | ICD-10-CM | POA: Diagnosis not present

## 2022-12-06 DIAGNOSIS — M25552 Pain in left hip: Secondary | ICD-10-CM | POA: Diagnosis not present

## 2022-12-06 DIAGNOSIS — M9903 Segmental and somatic dysfunction of lumbar region: Secondary | ICD-10-CM | POA: Diagnosis not present

## 2022-12-06 DIAGNOSIS — M5116 Intervertebral disc disorders with radiculopathy, lumbar region: Secondary | ICD-10-CM | POA: Diagnosis not present

## 2022-12-07 LAB — CULTURE, BLOOD (SINGLE)

## 2022-12-07 NOTE — Progress Notes (Signed)
Results sent through MyChart

## 2022-12-16 DIAGNOSIS — G4733 Obstructive sleep apnea (adult) (pediatric): Secondary | ICD-10-CM | POA: Diagnosis not present

## 2022-12-18 ENCOUNTER — Other Ambulatory Visit: Payer: Self-pay | Admitting: Medical

## 2022-12-30 ENCOUNTER — Other Ambulatory Visit: Payer: Self-pay | Admitting: Medical

## 2023-01-02 DIAGNOSIS — D1722 Benign lipomatous neoplasm of skin and subcutaneous tissue of left arm: Secondary | ICD-10-CM | POA: Diagnosis not present

## 2023-01-26 ENCOUNTER — Other Ambulatory Visit: Payer: Self-pay | Admitting: Cardiology

## 2023-02-03 ENCOUNTER — Ambulatory Visit (INDEPENDENT_AMBULATORY_CARE_PROVIDER_SITE_OTHER): Payer: BC Managed Care – PPO | Admitting: Medical

## 2023-02-03 VITALS — BP 130/84 | HR 88 | Temp 97.5°F | Wt 163.2 lb

## 2023-02-03 DIAGNOSIS — G8929 Other chronic pain: Secondary | ICD-10-CM

## 2023-02-03 DIAGNOSIS — M5442 Lumbago with sciatica, left side: Secondary | ICD-10-CM

## 2023-02-03 MED ORDER — TIZANIDINE HCL 4 MG PO TABS: 4.0000 mg | ORAL_TABLET | Freq: Two times a day (BID) | ORAL | 0 refills | Status: DC | PRN

## 2023-02-03 MED ORDER — GABAPENTIN 100 MG PO CAPS
100.0000 mg | ORAL_CAPSULE | Freq: Every day | ORAL | 1 refills | Status: DC
Start: 2023-02-03 — End: 2023-04-04

## 2023-02-03 MED ORDER — HYDROCODONE-ACETAMINOPHEN 7.5-325 MG PO TABS: 1.0000 | ORAL_TABLET | Freq: Two times a day (BID) | ORAL | 0 refills | Status: AC | PRN

## 2023-02-03 NOTE — Progress Notes (Signed)
Placed referral  

## 2023-02-03 NOTE — Patient Instructions (Signed)
Please go to [facility] for your lumbar spine xray.   Their hours are 8am - 4:30 pm Monday - Friday.  Take your insurance card with you.  [facility] [phone]  [address] [place], [place] [address]  Given your sciatic symptoms, lets begin the following regimen: Begin gabapentin 100 mg at bedtime to help with neuropathic pain.  Over the next month this can be increased in dose if need be. Begin short-term pain medicine Norco hydrocodone up to twice a day for worse pain over the next few days.  Caution as this can cause drowsiness Begin tizanidine muscle relaxer once or twice a day if tightness, soreness or spasm.  This can also cause drowsiness You can use some over-the-counter ibuprofen 200 mg, 3 tablets twice daily as needed short-term for pain and inflammation such as this weekend since you currently have pain Stretch daily and do some gentle range of motion activity We will go ahead and refer you to physical therapy for sciatica type pain If any red flag symptoms such as fever, sudden inability to walk, numbness in the genitals, blood in the urine or blood in the stool, then get reevaluated right away If there is any issue at the pharmacy such as prior authorization for pain medicine then let us know right away.  we have run into some issues with this recently

## 2023-02-03 NOTE — Progress Notes (Signed)
Subjective:  Donald Oliver is a 52 y.o. male who presents for Chief Complaint  Patient presents with   Sciatica    Sciatica flare up on left side  x 6 months but constant all the time. Pain level 8/10 right now     Here for sciatica type pain.  Been having problems for several months but worse in the last week or so.  Was more intermittent pain but now the pain is more constant in the left lower back and leg and buttock.  At times it hurts to even walk or difficult to walk.  Has some tingling in the leg at times.  No fever, no change in bowel or bladder, no blood in the urine or stool.  No numbness.  Active on the job and no recent injury trauma or fall.  Using some Advil.  No other aggravating or relieving factors.    No other c/o.  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 Laneta Simmers with LIMA to LAD, free RIMA to Diag and SVG to RCA     Combined hyperlipidemia    Depressed mood 07/19/2019   Eczema 09/05/2018   Elevated LFTs 05/24/2018   Encounter for health maintenance examination in adult 02/25/2015   Erectile dysfunction    Essential hypertension    Family history of premature CAD    Fatigue 05/24/2018   Fatty liver disease, nonalcoholic 05/24/2018   FH: premature coronary heart disease    Father had MI at age 51    Former smoker 08/29/2016   Gastroesophageal reflux disease 02/25/2015   GERD (gastroesophageal reflux disease)    History of substance abuse (HCC)    Pt had abused amphetamines : quit in 2005   Hypertension 2005   Impaired fasting blood sugar 2016   Keloid scar of skin 06/21/2016   Lipoma 07/19/2019   Low testosterone in male 05/24/2018   Metatarsalgia of right foot 06/21/2016   Mixed dyslipidemia 09/05/2018   Nail deformity 07/20/2015    Need for prophylactic vaccination against Streptococcus pneumoniae (pneumococcus) 12/17/2015   Need for prophylactic vaccination and inoculation against influenza 12/17/2015   Onychomycosis    OSA on CPAP 10/31/2016   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 1rst attempt, patient needs and appt for additional refills 30 tablet 0   ASPIRIN ADULT LOW DOSE 81 MG tablet TAKE 1 TABLET BY MOUTH DAILY *SWALLOW WHOLE* 90 tablet 3   buPROPion (WELLBUTRIN XL) 300 MG 24 hr tablet TAKE 1 TABLET BY MOUTH DAILY 30 tablet 5   ezetimibe (ZETIA) 10 MG tablet Take 1 tablet (10 mg total) by mouth daily. 1rst attempt, patient needs and appt for additional refills 30 tablet 0   lisinopril-hydrochlorothiazide (ZESTORETIC) 20-25 MG tablet Take 1 tablet by mouth daily. 1rst attempt, patient needs and appt for additional refills 30 tablet 0   pantoprazole (PROTONIX) 40 MG tablet TAKE 1 TABLET BY MOUTH DAILY 90 tablet 2   rosuvastatin (CRESTOR) 20 MG tablet TAKE 1 TABLET BY MOUTH DAILY 90 tablet 3   tadalafil (CIALIS) 5 MG tablet Take 1 tablet (5 mg  total) by mouth daily as needed for erectile dysfunction. 90 tablet 3   Testosterone 1.62 % GEL APPLY 4 PUMPS TO THE SKIN DAILY AS DIRECTED 150 g 2   VASCEPA 1 g capsule Take 2 capsules (2 g total) by mouth 2 (two) times daily. 120 capsule 3   nitroGLYCERIN (NITROSTAT) 0.4 MG SL tablet Place 1 tablet (0.4 mg total) under the tongue every 5 (five) minutes as needed for chest pain. 90 tablet 3   No current facility-administered medications on file prior to visit.   Past Surgical History:  Procedure Laterality Date   CARDIAC CATHETERIZATION N/A 05/29/2015   Procedure: Left Heart Cath and Coronary Angiography;  Surgeon: Corky Crafts, MD;  Location: [facility] INVASIVE CV LAB;  Service: Cardiovascular;  Laterality: N/A;   CARDIAC  CATHETERIZATION  05/29/2015   Procedure: Coronary Balloon Angioplasty;  Surgeon: Corky Crafts, MD;  Location: [facility] INVASIVE CV LAB;  Service: Cardiovascular;;   CORONARY ARTERY BYPASS GRAFT N/A 06/02/2015   Procedure: CORONARY ARTERY BYPASS GRAFTING (CABG) x  3 utilizing bilateral internal mammary artery and endoscopically harvested right sapheneous vein.;  Surgeon: Alleen Borne, MD;  Location: [facility] OR;  Service: Open Heart Surgery;  Laterality: N/A;   RHINOPLASTY  20065   w/repair of nasal fractuare; done at [facility]    TEE WITHOUT CARDIOVERSION N/A 06/02/2015   Procedure: TRANSESOPHAGEAL ECHOCARDIOGRAM (TEE);  Surgeon: Alleen Borne, MD;  Location: [facility] OR;  Service: Open Heart Surgery;  Laterality: N/A;     The following portions of the patient's history were reviewed and updated as appropriate: allergies, current medications, past family history, past medical history, past social history, past surgical history and problem list.  ROS Otherwise as in subjective above  Objective: BP 130/84   Pulse 88   Temp (!) 97.5 F (36.4 C)   Wt 163 lb 3.2 oz (74 kg)   BMI 23.75 kg/m   General appearance: alert, no distress, well developed, well nourished Back without obvious deformity, not particular tender to palpation, range of motion about 80% and normal.  No scoliosis MSK: Somewhat reduced internal range of motion of both hips but otherwise nontender with other normal range of motion of legs Neuro: Normal heel and toe walk, negative straight leg raise, seemingly normal strength and sensation of the legs Pulses: 2+ radial pulses, 2+ pedal pulses, normal cap refill Ext: no edema   Assessment: Encounter Diagnoses  Name Primary?   Left-sided low back pain with left-sided sciatica, unspecified chronicity Yes   Chronic left-sided low back pain with left-sided sciatica      Plan: Please go to [facility] for your lumbar spine xray.   Their hours are 8am - 4:30 pm Monday - Friday.   Take your insurance card with you.  [facility] [phone]  [address] [place], [place] [address]  Given your sciatic symptoms, lets begin the following regimen: Begin gabapentin 100 mg at bedtime to help with neuropathic pain.  Over the next month this can be increased in dose if need be. Begin short-term pain medicine Norco hydrocodone up to twice a day for worse pain over the next few days.  Caution as this can cause drowsiness Begin tizanidine muscle relaxer once or twice a day if tightness, soreness or spasm.  This can also cause drowsiness You can use some over-the-counter ibuprofen 200 mg, 3 tablets twice daily as needed short-term for pain and inflammation such as this weekend since you currently have pain Stretch daily and  do some gentle range of motion activity We will go ahead and refer you to physical therapy for sciatica type pain If any red flag symptoms such as fever, sudden inability to walk, numbness in the genitals, blood in the urine or blood in the stool, then get reevaluated right away If there is any issue at the pharmacy such as prior authorization for pain medicine then let us know right away.  we have run into some issues with this recently   [facility] [employer]" was seen today for sciatica.  Diagnoses and all orders for this visit:  Left-sided low back pain with left-sided sciatica, unspecified chronicity -     DG Lumbar Spine Complete; Future  Chronic left-sided low back pain with left-sided sciatica -     DG Lumbar Spine Complete; Future  Other orders -     tiZANidine (ZANAFLEX) 4 MG tablet; Take 1 tablet (4 mg total) by mouth 2 (two) times daily as needed for muscle spasms. -     gabapentin (NEURONTIN) 100 MG capsule; Take 1 capsule (100 mg total) by mouth at bedtime. -     HYDROcodone-acetaminophen (NORCO) 7.5-325 MG tablet; Take 1 tablet by mouth 2 (two) times daily as needed for up to 5 days for moderate pain (pain score 4-6).    Follow up:  pending xray

## 2023-02-05 NOTE — Progress Notes (Unsigned)
Cardiology Office Note:    Date:  02/07/2023   ID:  Donald Oliver, DOB Aug 08, 1970, MRN [id number]  PCP:  Donald Canavan, PA-C  Cardiologist:  Donald Herrlich, MD    Referring MD: Donald Canavan, PA-C    ASSESSMENT:    1. Coronary artery disease involving native coronary artery of native heart with unstable angina pectoris (HCC)   2. S/P CABG x 3   3. Essential hypertension   4. Mixed dyslipidemia    PLAN:    In order of problems listed above:  Continues to do well after bypass surgery New York Heart Association class I no anginal symptoms continue treatment including aspirin antihypertensive amlodipine BP is at target along with lisinopril HCTZ and combined lipid-lowering of high intensity statin Zetia and Vascepa with optimal lipid values Last echo 1 year ago low risk high exercise tolerance reassured and having no anginal symptoms Meds refilled   Next appointment: 1 year   Medication Adjustments/Labs and Tests Ordered: Current medicines are reviewed at length with the patient today.  Concerns regarding medicines are outlined above.  Orders Placed This Encounter  Procedures   EKG 12-Lead   No orders of the defined types were placed in this encounter.    History of Present Illness:    Donald Oliver is a 52 y.o. male with a hx of CAD with CABG 2017 hypertension hyperlipidemia last seen 01/11/2022.  Maintained on combined lipid-lowering therapy with Zetia and his high intensity statin and his LDL is ideal screening for LP(a) is normal.  Following the visit he had a stress echocardiogram 01/28/2022 which showed normal EKG and echo response with a well-preserved exercise tolerance of 13-1/2 METS normal blood pressure response and no exertional chest pain Compliance with diet, lifestyle and medications: Yes  He has lost in the range of 40 pounds feels much better sleep apnea is pretty much resolved and has had no cardiovascular symptoms of edema shortness of breath  chest pain palpitation or syncope His lipids are ideal on combination high intensity statin Zetia and icosapent ethyl Ambulatory blood pressures consistently run 130/80 or less Past Medical History:  Diagnosis Date   Abnormal CBC 10/08/2020   Anxiety 10/08/2020   Anxiety and depression 07/09/2020   Benign prostatic hyperplasia 09/05/2018   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 Laneta Simmers with LIMA to LAD, free RIMA to Diag and SVG to RCA     Eczema 09/05/2018   Encounter for health maintenance examination in adult 02/25/2015   Erectile dysfunction    Essential hypertension    Family history of premature CAD    Fatty liver disease, nonalcoholic 05/24/2018   Former smoker 08/29/2016   Gastroesophageal reflux disease 02/25/2015   High risk medication use 07/09/2020   History of substance abuse (HCC)    Pt had abused amphetamines : quit in 2005   Hypogonadism in male 02/07/2020   Impaired fasting blood sugar 2016   Insomnia 10/08/2020   Keloid scar of skin 06/21/2016   Lipoma 07/19/2019   Lipoma of left upper extremity 11/07/2022   Low testosterone in male 05/24/2018   Metatarsalgia of right foot 06/21/2016   Mixed dyslipidemia 09/05/2018   Onychomycosis    OSA on CPAP 10/31/2016   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   Screening for prostate cancer 07/09/2020   Shoulder  mass 11/07/2022   Tinnitus of both ears 07/09/2020   Vaccine counseling 07/09/2020   Vapes nicotine containing substance 10/19/2021    Current Medications: Current Meds  Medication Sig   amLODipine (NORVASC) 10 MG tablet Take 1 tablet (10 mg total) by mouth daily. 1rst attempt, patient needs and appt for additional refills   ASPIRIN ADULT LOW DOSE 81 MG tablet TAKE 1 TABLET BY MOUTH DAILY *SWALLOW WHOLE*   buPROPion (WELLBUTRIN XL) 300 MG 24 hr tablet TAKE 1 TABLET BY  MOUTH DAILY   ezetimibe (ZETIA) 10 MG tablet Take 1 tablet (10 mg total) by mouth daily. 1rst attempt, patient needs and appt for additional refills   gabapentin (NEURONTIN) 100 MG capsule Take 1 capsule (100 mg total) by mouth at bedtime.   HYDROcodone-acetaminophen (NORCO) 7.5-325 MG tablet Take 1 tablet by mouth 2 (two) times daily as needed for up to 5 days for moderate pain (pain score 4-6).   lisinopril-hydrochlorothiazide (ZESTORETIC) 20-25 MG tablet Take 1 tablet by mouth daily. 1rst attempt, patient needs and appt for additional refills   nitroGLYCERIN (NITROSTAT) 0.4 MG SL tablet Place 0.4 mg under the tongue every 5 (five) minutes as needed for chest pain.   pantoprazole (PROTONIX) 40 MG tablet TAKE 1 TABLET BY MOUTH DAILY   rosuvastatin (CRESTOR) 20 MG tablet TAKE 1 TABLET BY MOUTH DAILY   tadalafil (CIALIS) 5 MG tablet Take 1 tablet (5 mg total) by mouth daily as needed for erectile dysfunction.   Testosterone 1.62 % GEL APPLY 4 PUMPS TO THE SKIN DAILY AS DIRECTED   tiZANidine (ZANAFLEX) 4 MG tablet Take 1 tablet (4 mg total) by mouth 2 (two) times daily as needed for muscle spasms.   VASCEPA 1 g capsule Take 2 capsules (2 g total) by mouth 2 (two) times daily.      EKGs/Labs/Other Studies Reviewed:    The following studies were reviewed today:  Cardiac Studies & Procedures   CARDIAC CATHETERIZATION  CARDIAC CATHETERIZATION 05/29/2015  Narrative  Severe mid LAD disease (75%), FFR 0.69.  Prox RCA to Dist RCA lesion, 99% stenosed. Chronic total occlusion of the distal RCA with left to right collaterals.  Culprit lesion is occluded first diagonal. THis was treated successfully with a 2.0 x 12 balloon. There is a 25% residual stenosis in the treated area. THere is an untreated ostial lesion in this vessel as well.  Prox Cx lesion, 50% stenosed.  Ramus lesion, 75% stenosed. This is a small vessel.  The left ventricular systolic function is normal.  Complex patient with  multi-vessel disease. Discussed extensively with Dr. Donnie Aho.  Will plan for evaluation with CVTS for CABG.  Culprit vessel treated with PTCA, to restore flow.  No Plavix given.  IV tirofiban started as antiplatelet therapy.  Could continue this until surgery depending on how he is feeling.  Original order is for 18 hours.  He will need beta blocker, statin, and smoking cessation.  F/u with Dr. Donnie Aho.  Findings Coronary Findings Diagnostic  Dominance: Right  Left Anterior Descending Pressure wire/FFR was performed on the lesion.  FFR: 0.69.  First Diagonal Branch The lesion is type C located at the major branch.  Ramus Intermedius . Vessel is small. The lesion is type C located at the major branch.  Left Circumflex  Right Coronary Artery  The lesion is type C Chronic total occlusion.  Right Posterior Descending Artery Collaterals RPDA filled by collaterals from Dist LAD.  Third Right Posterolateral Branch Collaterals 3rd RPL filled by  collaterals from Dist Cx.  Intervention  1st Diag lesion Angioplasty Angioplasty alone was performed.   There is no pre-interventional antegrade distal flow.  The post-interventional distal flow is normal (TIMI 3).   The intervention was successful.    No complications occured at this lesion.   IC nitroglycerin was given.   Pressure wire/FFR was not performed on the lesion Supplies used: BALLN EUPHORA RX 2.0X12 There is a 25% residual stenosis post intervention.   STRESS TESTS  ECHOCARDIOGRAM STRESS TEST 01/28/2022  Narrative EXERCISE STRESS ECHO REPORT   --------------------------------------------------------------------------------  Patient Name:   Donald Oliver Date of Exam: 01/28/2022 Medical Rec #:  [id number]      Height:       70.0 in Accession #:    [id number]     Weight:       205.2 lb Date of Birth:  02/21/1971      BSA:          2.110 m Patient Age:    51 years       BP:           139/82 mmHg Patient Gender: M               HR:           86 bpm. Exam Location:  [address]  Procedure: Stress Echo and Intracardiac Opacification Agent  Indications:    I25.110 Coronary artery disease;  History:        Patient has prior history of Echocardiogram examinations, most recent 06/13/2018.  Sonographer:    Cathie Beams RCS Referring Phys: [phone] Felder Lebeda J Zanae Kuehnle  IMPRESSIONS   1. This is a negative stress echocardiogram for ischemia.  FINDINGS  Exam Protocol: The patient exercised on a treadmill according to a Bruce protocol.   Patient Performance: The patient exercised for 11 minutes and 11 seconds, achieving 13.4 METS. The maximum stage achieved was IV of the Bruce protocol. The baseline heart rate was 86 bpm. The heart rate at peak stress was 171 bpm. The target heart rate was calculated to be 142 bpm. The percentage of maximum predicted heart rate achieved was 102.0 %. The baseline blood pressure was 139/82 mmHg. The blood pressure at peak stress was 209/76 mmHg. The blood pressure response was hypertensive. The patient developed fatigue and RPE = 17/18 during the stress exam.  EKG: Resting EKG showed normal sinus rhythm. The patient developed no significant ST-T wave changes during exercise.   2D Echo Findings: The baseline ejection fraction was 60%. The peak ejection fraction at stress was 80%. Baseline regional wall motion abnormalities were not present. There were no stress-induced wall motion abnormalities. This is a negative stress echocardiogram for ischemia.   Carolan Clines Electronically signed on 01/28/2022 at 4:48:07 PM      Final    TEE  ECHO TEE 06/02/2015  Narrative *[place]* *[facility]* [address] [place], [place] [address] [phone]  ------------------------------------------------------------------- Intraoperative Transesophageal Echocardiography  Patient:    Dontarius, Devall MR #:       [id number] Study Date: 06/02/2015 Gender:     M Age:         44 Height:     172.7 cm Weight:     88.2 kg BSA:        2.08 m^2 Pt. Status: Room:       2W10C  ATTENDING    Evelene Croon, MD ORDERING     Evelene Croon, MD PERFORMING  Judie Petit, MD ADMITTING    Ellwood Handler SONOGRAPHER  Sheralyn Boatman  cc:  ------------------------------------------------------------------- LV EF: 50% -   55%  ------------------------------------------------------------------- Indications:      CAD of native vessels 414.01.  ------------------------------------------------------------------- History:   PMH:   Myocardial infarction.  Risk factors:  Current tobacco use. Dyslipidemia.  ------------------------------------------------------------------- Study Conclusions  - Left ventricle: Systolic function was normal. The estimated ejection fraction was in the range of 50% to 55%. - Aortic valve: No evidence of vegetation. - Mitral valve: No evidence of vegetation. - Atrial septum: No defect or patent foramen ovale was identified. - Tricuspid valve: No evidence of vegetation.  Impressions:  - Post Bypass: The patient came off bypass on the initial attempt. Left ventricular contraction did improve with volume replacement. There did not appear to be any new findings from preop exam. At the end of the procedure the TEE that had been placed uneventfully after induction was removed without difficulty. At the end of the procedure the patient was taken to the SICU in stable condition.  Intraoperative transesophageal echocardiography.  Birthdate: Patient birthdate: 1970/08/04.  Age:  Patient is 52 yr old.  Sex: Gender: male.    BMI: 29.6 kg/m^2.  Blood pressure:     102/60 Patient status:  Inpatient.  Study date:  Study date: 06/02/2015. Study time: 09:05 AM.  Location:  Operating room.  -------------------------------------------------------------------  ------------------------------------------------------------------- Left ventricle:   Systolic function was normal. The estimated ejection fraction was in the range of 50% to 55%.  ------------------------------------------------------------------- Aortic valve:   Structurally normal valve.   Cusp separation was normal.  No evidence of vegetation.  Doppler:  There was no regurgitation.  ------------------------------------------------------------------- Mitral valve:   Structurally normal valve.   Leaflet separation was normal.  No evidence of vegetation.  Doppler:  There was no regurgitation.  ------------------------------------------------------------------- Left atrium:  The atrium was normal in size.  ------------------------------------------------------------------- Atrial septum:  No defect or patent foramen ovale was identified.  ------------------------------------------------------------------- Tricuspid valve:   Structurally normal valve.   Leaflet separation was normal.  No evidence of vegetation.  Doppler:  There was no regurgitation.  ------------------------------------------------------------------- Post bypass:  ------------------------------------------------------------------- Prepared and Electronically Authenticated by  Judie Petit, MD 2017-03-17T03:00:15            EKG Interpretation Date/Time:  Tuesday February 07 2023 09:47:28 EST Ventricular Rate:  82 PR Interval:  150 QRS Duration:  106 QT Interval:  366 QTC Calculation: 427 R Axis:   120  Text Interpretation: Sinus rhythm Left atrial enlargement Left posterior fascicular block When compared with ECG of 21-Sep-2019 14:27, Unchanged Confirmed by Donald Oliver ([id number]) on 02/07/2023 9:52:07 AM   Recent Labs: 10/24/2022: ALT 25 12/01/2022: BUN 15; Creatinine, Ser 0.89; Hemoglobin 17.5; Platelets 428; Potassium 3.7; Sodium 140; TSH 4.450  Recent Lipid Panel    Component Value Date/Time   CHOL 109 10/24/2022 0908   TRIG 98 10/24/2022 0908   HDL 51 10/24/2022 0908   CHOLHDL  2.1 10/24/2022 0908   CHOLHDL 5.6 (H) 10/31/2016 1500   VLDL 74 (H) 10/31/2016 1500   LDLCALC 40 10/24/2022 0908    Physical Exam:    VS:  BP (!) 148/96   Pulse 82   Ht 5\' 10"  (1.778 m)   Wt 167 lb 12.8 oz (76.1 kg)   SpO2 98%   BMI 24.08 kg/m     Wt Readings from Last 3 Encounters:  02/07/23 167 lb 12.8 oz (76.1 kg)  02/03/23 163 lb 3.2 oz (74  kg)  12/01/22 168 lb 9.6 oz (76.5 kg)     GEN:  Well nourished, well developed in no acute distress HEENT: Normal NECK: No JVD; No carotid bruits LYMPHATICS: No lymphadenopathy CARDIAC: RRR, no murmurs, rubs, gallops RESPIRATORY:  Clear to auscultation without rales, wheezing or rhonchi  ABDOMEN: Soft, non-tender, non-distended MUSCULOSKELETAL:  No edema; No deformity  SKIN: Warm and dry NEUROLOGIC:  Alert and oriented x 3 PSYCHIATRIC:  Normal affect    Signed, Donald Herrlich, MD  02/07/2023 10:07 AM    [facility]

## 2023-02-06 ENCOUNTER — Telehealth: Payer: BC Managed Care – PPO | Admitting: Medical

## 2023-02-07 ENCOUNTER — Encounter: Payer: Self-pay | Admitting: Cardiology

## 2023-02-07 ENCOUNTER — Ambulatory Visit: Payer: BC Managed Care – PPO | Attending: Cardiology | Admitting: Cardiology

## 2023-02-07 VITALS — BP 130/80 | HR 82 | Ht 70.0 in | Wt 167.8 lb

## 2023-02-07 DIAGNOSIS — I25110 Atherosclerotic heart disease of native coronary artery with unstable angina pectoris: Secondary | ICD-10-CM | POA: Diagnosis not present

## 2023-02-07 DIAGNOSIS — I10 Essential (primary) hypertension: Secondary | ICD-10-CM | POA: Diagnosis not present

## 2023-02-07 DIAGNOSIS — Z951 Presence of aortocoronary bypass graft: Secondary | ICD-10-CM

## 2023-02-07 DIAGNOSIS — E782 Mixed hyperlipidemia: Secondary | ICD-10-CM

## 2023-02-07 NOTE — Patient Instructions (Signed)

## 2023-02-24 ENCOUNTER — Other Ambulatory Visit: Payer: Self-pay | Admitting: Cardiology

## 2023-02-27 NOTE — Telephone Encounter (Signed)
Rx refill sent to pharmacy. 

## 2023-03-01 NOTE — Therapy (Unsigned)
OUTPATIENT PHYSICAL THERAPY THORACOLUMBAR EVALUATION   Patient Name: Donald Oliver MRN: [id number] DOB:Sep 25, 1970, 52 y.o., male Today's Date: 03/01/2023  END OF SESSION:   Past Medical History:  Diagnosis Date   Abnormal CBC 10/08/2020   Anxiety 10/08/2020   Anxiety and depression 07/09/2020   Benign prostatic hyperplasia 09/05/2018   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 Laneta Simmers with LIMA to LAD, free RIMA to Diag and SVG to RCA     Eczema 09/05/2018   Encounter for health maintenance examination in adult 02/25/2015   Erectile dysfunction    Essential hypertension    Family history of premature CAD    Fatty liver disease, nonalcoholic 05/24/2018   Former smoker 08/29/2016   Gastroesophageal reflux disease 02/25/2015   High risk medication use 07/09/2020   History of substance abuse (HCC)    Pt had abused amphetamines : quit in 2005   Hypogonadism in male 02/07/2020   Impaired fasting blood sugar 2016   Insomnia 10/08/2020   Keloid scar of skin 06/21/2016   Lipoma 07/19/2019   Lipoma of left upper extremity 11/07/2022   Low testosterone in male 05/24/2018   Metatarsalgia of right foot 06/21/2016   Mixed dyslipidemia 09/05/2018   Onychomycosis    OSA on CPAP 10/31/2016   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   Screening for prostate cancer 07/09/2020   Shoulder mass 11/07/2022   Tinnitus of both ears 07/09/2020   Vaccine counseling 07/09/2020   Vapes nicotine containing substance 10/19/2021   Past Surgical History:  Procedure Laterality Date   CARDIAC CATHETERIZATION N/A 05/29/2015   Procedure: Left Heart Cath and Coronary Angiography;  Surgeon: Corky Crafts, MD;  Location: [facility] INVASIVE CV LAB;  Service: Cardiovascular;  Laterality: N/A;   CARDIAC CATHETERIZATION  05/29/2015   Procedure: Coronary Balloon Angioplasty;   Surgeon: Corky Crafts, MD;  Location: [facility] INVASIVE CV LAB;  Service: Cardiovascular;;   CORONARY ARTERY BYPASS GRAFT N/A 06/02/2015   Procedure: CORONARY ARTERY BYPASS GRAFTING (CABG) x  3 utilizing bilateral internal mammary artery and endoscopically harvested right sapheneous vein.;  Surgeon: Alleen Borne, MD;  Location: [facility] OR;  Service: Open Heart Surgery;  Laterality: N/A;   RHINOPLASTY  20065   w/repair of nasal fractuare; done at [facility]    TEE WITHOUT CARDIOVERSION N/A 06/02/2015   Procedure: TRANSESOPHAGEAL ECHOCARDIOGRAM (TEE);  Surgeon: Alleen Borne, MD;  Location: [facility] OR;  Service: Open Heart Surgery;  Laterality: N/A;   Patient Active Problem List   Diagnosis Date Noted   Lipoma of left upper extremity 11/07/2022   Shoulder mass 11/07/2022   Vapes nicotine containing substance 10/19/2021   Insomnia 10/08/2020   Abnormal CBC 10/08/2020   Anxiety 10/08/2020   Anxiety and depression 07/09/2020   Screening for prostate cancer 07/09/2020   High risk medication use 07/09/2020   Vaccine counseling 07/09/2020   Tinnitus of both ears 07/09/2020   Hypogonadism in male 02/07/2020   History of substance abuse (HCC)    Lipoma 07/19/2019   Bilateral high scrotal testes 05/23/2019   Mixed dyslipidemia 09/05/2018   Eczema 09/05/2018   Benign prostatic hyperplasia 09/05/2018   Low testosterone in male 05/24/2018   Fatty liver disease, nonalcoholic 05/24/2018   OSA on CPAP 10/31/2016   Former smoker 08/29/2016   Keloid scar of skin 06/21/2016   Metatarsalgia  of right foot 06/21/2016   Onychomycosis 07/20/2015   Impaired fasting blood sugar 07/20/2015   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   CAD (coronary artery disease), native coronary artery 06/01/2015   Gastroesophageal reflux disease 02/25/2015   Encounter for health maintenance examination in adult 02/25/2015   Family history of premature CAD    Erectile dysfunction    Essential hypertension     PCP: Jac Canavan,  PA-C  REFERRING PROVIDER: Jac Canavan, PA-C  REFERRING DIAG: [phone] (ICD-10-CM) - Left-sided low back pain with left-sided sciatica, unspecified chronicity M54.42,G89.29 (ICD-10-CM) - Chronic left-sided low back pain with left-sided sciatica  Rationale for Evaluation and Treatment: Rehabilitation  THERAPY DIAG:  No diagnosis found.  ONSET DATE: ***  SUBJECTIVE:                                                                                                                                                                                           SUBJECTIVE STATEMENT: ***  PERTINENT HISTORY:  ***  PAIN:  Are you having pain? {OPRCPAIN:27236}  PRECAUTIONS: None  RED FLAGS: None   WEIGHT BEARING RESTRICTIONS: No  FALLS:  Has patient fallen in last 6 months? {fallsyesno:27318}  LIVING ENVIRONMENT: Lives with: {OPRC lives with:25569::"lives with their family"} Lives in: {Lives in:25570} Stairs: {opstairs:27293} Has following equipment at home: {Assistive devices:23999}  OCCUPATION: ***  PLOF: {PLOF:24004}  PATIENT GOALS: ***  NEXT MD VISIT: ***  OBJECTIVE:  Note: Objective measures were completed at Evaluation unless otherwise noted.  DIAGNOSTIC FINDINGS:  XR ordered  PATIENT SURVEYS:  {rehab surveys:24030}  SCREENING FOR RED FLAGS: Bowel or bladder incontinence: {Yes/No:304960894} Spinal tumors: {Yes/No:304960894} Cauda equina syndrome: {Yes/No:304960894} Compression fracture: {Yes/No:304960894} Abdominal aneurysm: {Yes/No:304960894}  COGNITION: Overall cognitive status: {cognition:24006}     SENSATION: {sensation:27233}  MUSCLE LENGTH: Hamstrings: Right *** deg; Left *** deg Thomas test: Right *** deg; Left *** deg  POSTURE: {posture:25561}  PALPATION: ***  LUMBAR ROM:   AROM eval  Flexion   Extension   Right lateral flexion   Left lateral flexion   Right rotation   Left rotation    (Blank rows = not tested)  LOWER EXTREMITY  ROM:     {AROM/PROM:27142}  Right eval Left eval  Hip flexion    Hip extension    Hip abduction    Hip adduction    Hip internal rotation    Hip external rotation    Knee flexion    Knee extension    Ankle dorsiflexion    Ankle plantarflexion    Ankle inversion    Ankle eversion     (Blank rows = not tested)  LOWER EXTREMITY MMT:    MMT Right eval Left eval  Hip flexion    Hip extension    Hip abduction    Hip adduction    Hip internal rotation    Hip external rotation    Knee flexion    Knee extension    Ankle dorsiflexion    Ankle plantarflexion    Ankle inversion    Ankle eversion     (Blank rows = not tested)  LUMBAR SPECIAL TESTS:  {lumbar special test:25242}  FUNCTIONAL TESTS:  {Functional tests:24029}  GAIT: Distance walked: *** Assistive device utilized: {Assistive devices:23999} Level of assistance: {Levels of assistance:24026} Comments: ***  TODAY'S TREATMENT:                                                                                                                              DATE: ***    PATIENT EDUCATION:  Education details: *** Person educated: {Person educated:25204} Education method: {Education Method:25205} Education comprehension: {Education Comprehension:25206}  HOME EXERCISE PROGRAM: ***  ASSESSMENT:  CLINICAL IMPRESSION: Patient is a *** y.o. *** who was seen today for physical therapy evaluation and treatment for ***.   OBJECTIVE IMPAIRMENTS: {opptimpairments:25111}.   ACTIVITY LIMITATIONS: {activitylimitations:27494}  PARTICIPATION LIMITATIONS: {participationrestrictions:25113}  PERSONAL FACTORS: {Personal factors:25162} are also affecting patient's functional outcome.   REHAB POTENTIAL: {rehabpotential:25112}  CLINICAL DECISION MAKING: {clinical decision making:25114}  EVALUATION COMPLEXITY: {Evaluation complexity:25115}   GOALS: Goals reviewed with patient? {yes/no:20286}  SHORT TERM GOALS: Target  date: ***  *** Baseline: Goal status: INITIAL  2.  *** Baseline:  Goal status: INITIAL  3.  *** Baseline:  Goal status: INITIAL  4.  *** Baseline:  Goal status: INITIAL  5.  *** Baseline:  Goal status: INITIAL  6.  *** Baseline:  Goal status: INITIAL  LONG TERM GOALS: Target date: ***  *** Baseline:  Goal status: INITIAL  2.  *** Baseline:  Goal status: INITIAL  3.  *** Baseline:  Goal status: INITIAL  4.  *** Baseline:  Goal status: INITIAL  5.  *** Baseline:  Goal status: INITIAL  6.  *** Baseline:  Goal status: INITIAL  PLAN:  PT FREQUENCY: {rehab frequency:25116}  PT DURATION: {rehab duration:25117}  PLANNED INTERVENTIONS: {rehab planned interventions:25118::"97110-Therapeutic exercises","97530- Therapeutic [phone]- Neuromuscular re-education","97535- Self DGUY","40347- Manual therapy"}.  PLAN FOR NEXT SESSION: ***   Diannah Rindfleisch, PT 03/01/2023, 2:07 PM

## 2023-03-02 ENCOUNTER — Ambulatory Visit: Payer: BC Managed Care – PPO | Admitting: Physical Therapy

## 2023-03-09 ENCOUNTER — Encounter: Payer: Self-pay | Admitting: Physical Therapy

## 2023-03-09 ENCOUNTER — Ambulatory Visit: Payer: BC Managed Care – PPO | Attending: Medical | Admitting: Physical Therapy

## 2023-03-09 DIAGNOSIS — M2560 Stiffness of unspecified joint, not elsewhere classified: Secondary | ICD-10-CM | POA: Diagnosis not present

## 2023-03-09 DIAGNOSIS — M5416 Radiculopathy, lumbar region: Secondary | ICD-10-CM | POA: Diagnosis not present

## 2023-03-09 DIAGNOSIS — G8929 Other chronic pain: Secondary | ICD-10-CM | POA: Diagnosis not present

## 2023-03-09 DIAGNOSIS — M5442 Lumbago with sciatica, left side: Secondary | ICD-10-CM | POA: Diagnosis not present

## 2023-03-09 DIAGNOSIS — M5459 Other low back pain: Secondary | ICD-10-CM | POA: Diagnosis not present

## 2023-03-09 NOTE — Therapy (Addendum)
OUTPATIENT PHYSICAL THERAPY THORACOLUMBAR EVALUATION   Patient Name: Donald Oliver MRN: [id number] DOB:1970-08-24, 52 y.o., male Today's Date: 03/09/2023  END OF SESSION:  PT End of Session - 03/09/23 0906     Visit Number 1    Number of Visits 16    Date for PT Re-Evaluation 05/04/23    Authorization Type [employer]    PT Start Time 0809    PT Stop Time 0900    PT Time Calculation (min) 51 min    Activity Tolerance Patient tolerated treatment well    Behavior During Therapy [facility] for tasks assessed/performed             Past Medical History:  Diagnosis Date   Abnormal CBC 10/08/2020   Anxiety 10/08/2020   Anxiety and depression 07/09/2020   Benign prostatic hyperplasia 09/05/2018   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 Laneta Simmers with LIMA to LAD, free RIMA to Diag and SVG to RCA     Eczema 09/05/2018   Encounter for health maintenance examination in adult 02/25/2015   Erectile dysfunction    Essential hypertension    Family history of premature CAD    Fatty liver disease, nonalcoholic 05/24/2018   Former smoker 08/29/2016   Gastroesophageal reflux disease 02/25/2015   High risk medication use 07/09/2020   History of substance abuse (HCC)    Pt had abused amphetamines : quit in 2005   Hypogonadism in male 02/07/2020   Impaired fasting blood sugar 2016   Insomnia 10/08/2020   Keloid scar of skin 06/21/2016   Lipoma 07/19/2019   Lipoma of left upper extremity 11/07/2022   Low testosterone in male 05/24/2018   Metatarsalgia of right foot 06/21/2016   Mixed dyslipidemia 09/05/2018   Onychomycosis    OSA on CPAP 10/31/2016   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   Screening for prostate cancer 07/09/2020   Shoulder mass 11/07/2022   Tinnitus of both ears 07/09/2020   Vaccine counseling 07/09/2020   Vapes nicotine containing  substance 10/19/2021   Past Surgical History:  Procedure Laterality Date   CARDIAC CATHETERIZATION N/A 05/29/2015   Procedure: Left Heart Cath and Coronary Angiography;  Surgeon: Corky Crafts, MD;  Location: [facility] INVASIVE CV LAB;  Service: Cardiovascular;  Laterality: N/A;   CARDIAC CATHETERIZATION  05/29/2015   Procedure: Coronary Balloon Angioplasty;  Surgeon: Corky Crafts, MD;  Location: [facility] INVASIVE CV LAB;  Service: Cardiovascular;;   CORONARY ARTERY BYPASS GRAFT N/A 06/02/2015   Procedure: CORONARY ARTERY BYPASS GRAFTING (CABG) x  3 utilizing bilateral internal mammary artery and endoscopically harvested right sapheneous vein.;  Surgeon: Alleen Borne, MD;  Location: [facility] OR;  Service: Open Heart Surgery;  Laterality: N/A;   RHINOPLASTY  20065   w/repair of nasal fractuare; done at [facility]    TEE WITHOUT CARDIOVERSION N/A 06/02/2015   Procedure: TRANSESOPHAGEAL ECHOCARDIOGRAM (TEE);  Surgeon: Alleen Borne, MD;  Location: [facility] OR;  Service: Open Heart Surgery;  Laterality: N/A;   Patient Active Problem List   Diagnosis Date Noted   Lipoma of left upper extremity 11/07/2022   Shoulder mass 11/07/2022   Vapes nicotine containing substance 10/19/2021   Insomnia 10/08/2020   Abnormal CBC 10/08/2020   Anxiety 10/08/2020   Anxiety and depression 07/09/2020   Screening for prostate cancer 07/09/2020   High risk medication use  07/09/2020   Vaccine counseling 07/09/2020   Tinnitus of both ears 07/09/2020   Hypogonadism in male 02/07/2020   History of substance abuse (HCC)    Lipoma 07/19/2019   Bilateral high scrotal testes 05/23/2019   Mixed dyslipidemia 09/05/2018   Eczema 09/05/2018   Benign prostatic hyperplasia 09/05/2018   Low testosterone in male 05/24/2018   Fatty liver disease, nonalcoholic 05/24/2018   OSA on CPAP 10/31/2016   Former smoker 08/29/2016   Keloid scar of skin 06/21/2016   Metatarsalgia of right foot 06/21/2016   Onychomycosis 07/20/2015   Impaired  fasting blood sugar 07/20/2015   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   CAD (coronary artery disease), native coronary artery 06/01/2015   Gastroesophageal reflux disease 02/25/2015   Encounter for health maintenance examination in adult 02/25/2015   Family history of premature CAD    Erectile dysfunction    Essential hypertension     PCP: Jac Canavan, PA-C  REFERRING PROVIDER: Jac Canavan, PA-C  REFERRING DIAG: [phone] (ICD-10-CM) - Left-sided low back pain with left-sided sciatica, unspecified chronicity M54.42,G89.29 (ICD-10-CM) - Chronic left-sided low back pain with left-sided sciatica  Rationale for Evaluation and Treatment: Rehabilitation  THERAPY DIAG:  Other low back pain  Radiculopathy, lumbar region  Joint stiffness of spine  ONSET DATE: 3-4 mos.   SUBJECTIVE:                                                                                                                                                                                           SUBJECTIVE STATEMENT: Pt reports 3 month history of low back pain in L side, radiating to the L buttock and post/lateral.  He has had episodes of numbness, tingling.  Pain does not travel below the knee. Leg pain increases with walking and standing. He denies weakness.  No pain in Rt side.  No XR no MRI. 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 Patient has sciatica  in the past briefly but it has gone away.  PERTINENT HISTORY:  Cardiac issues, history of recent weight loss of 40+ lbs this year  PAIN: Can be 7/10-8/10. He can relieve his pain but never completely 2/10 Are you having pain? Yes: NPRS scale: 5/10 Pain location: L low back to L glute  Pain description: achy and sore  Aggravating factors: lifting  Relieving factors: changing positions, Advil   PRECAUTIONS: None  RED FLAGS: None   WEIGHT BEARING RESTRICTIONS: No  FALLS:  Has patient fallen in last 6  months? No  LIVING ENVIRONMENT: Lives with:  mom, is divorced Lives in: House/apartment Stairs:  No Has following equipment at home:  none   OCCUPATION: Works full time Network engineer.   PLOF: Independent  PATIENT GOALS: Pain relief to continue   NEXT MD VISIT: as needed   OBJECTIVE:  Note: Objective measures were completed at Evaluation unless otherwise noted.  DIAGNOSTIC FINDINGS:  XR ordered  PATIENT SURVEYS:  FOTO 45%  SCREENING FOR RED FLAGS: NT COGNITION: Overall cognitive status: Within functional limits for tasks assessed     SENSATION: WFL  MUSCLE LENGTH: Hamstrings: passive SLR to 35-40 deg    POSTURE: rounded shoulders, forward head, and increased thoracic kyphosis  PALPATION: Stiffness throughout thoracic and lumbar spine, pain along L4 L5 and lateral SI border.  Piriformis and glute med min tenderness   LUMBAR ROM:   AROM eval  Flexion Slight bend in knees, Stretch in legs and back  Extension Lacking 50% Stiff min pain   Right lateral flexion 50%   Left lateral flexion 50%   Right rotation 25%  Left rotation 25%   (Blank rows = not tested)  LOWER EXTREMITY ROM:     Passive  Right eval Left eval  Hip flexion 110 110  Hip extension    Hip abduction    Hip adduction    Hip internal rotation 20 10  Hip external rotation 35 35  Knee flexion    Knee extension    Ankle dorsiflexion    Ankle plantarflexion    Ankle inversion    Ankle eversion     (Blank rows = not tested)  LOWER EXTREMITY MMT:    MMT Right eval Left eval  Hip flexion 5 5  Hip extension 4+ 4+  Hip abduction 4+ 4+  Hip adduction    Hip internal rotation    Hip external rotation    Knee flexion 5 4+  Knee extension 5 5  Ankle dorsiflexion 5 5  Ankle plantarflexion    Ankle inversion    Ankle eversion     (Blank rows = not tested)  LUMBAR SPECIAL TESTS:  Straight leg raise test: Negative and Slump test: Positive SLR with increased hamstring tightness, pain in  buttocks  FUNCTIONAL TESTS:  2 minute walk test: 470 feet, pain increased to 5/10 in L post lateral LW Unable to stand > 5 sec in SLS   GAIT: Distance walked: 470 Assistive device utilized: None Level of assistance: Complete Independence Comments: normal pace , no limp , no deviations  TODAY'S TREATMENT:                                                                                                                              DATE: 03/09/23   [facility] Adult PT Treatment:  DATE: 03/09/23 Therapeutic Exercise: Supine knee to chest to knee extension for hamstrings Hamstring stretch with strap Lower trunk rotation  Child pose  Self Care: HEP, spine mobility, hip mobility, POC   PATIENT EDUCATION:  Education details: see above  Person educated: Patient Education method: Explanation, Demonstration, and Handouts Education comprehension: verbalized understanding, returned demonstration, and needs further education  HOME EXERCISE PROGRAM: Access Code: [email or web address] URL: [email or web address] Date: 03/09/2023 Prepared by: Karie Mainland  Exercises - Cat Cow to Child's Pose  - 1 x daily - 7 x weekly - 1 sets - 5 reps - 30 hold - Standing 'L' Stretch at Counter  - 1 x daily - 7 x weekly - 1 sets - 5 reps - 30 hold - Supine Lower Trunk Rotation  - 1 x daily - 7 x weekly - 2 sets - 10 reps - 10 hold - Supine Hamstring Stretch with Strap  - 1 x daily - 7 x weekly - 1 sets - 3 reps - 30 hold - Supine Single Knee to Chest Stretch  - 1 x daily - 7 x weekly - 1 sets - 5 reps - 30 hold - Supine Hamstring Stretch  - 1 x daily - 7 x weekly - 1 sets - 10 reps - 5 hold - Seated Hamstring Stretch  - 1 x daily - 7 x weekly - 1 sets - 3 reps - 30 hold  ASSESSMENT:  CLINICAL IMPRESSION: Patient is a 52 y.o. male  who was seen today for physical therapy evaluation and treatment for lumbar radiculopathy. Pt symptoms in prone did not aggravate or  improve his LE pain but he was not hurting much at the time of this test.  He has good strength but sig. Stiffness in spine and hips.   OBJECTIVE IMPAIRMENTS: decreased mobility, difficulty walking, decreased ROM, increased fascial restrictions, impaired flexibility, improper body mechanics, postural dysfunction, pain, and core stability  .   ACTIVITY LIMITATIONS: carrying, lifting, bending, standing, squatting, sleeping, and locomotion level  PARTICIPATION LIMITATIONS: shopping and community activity  PERSONAL FACTORS: Profession and 1-2 comorbidities: chronic pain, lack of exercise, cardiac  are also affecting patient's functional outcome.   REHAB POTENTIAL: Excellent  CLINICAL DECISION MAKING: Stable/uncomplicated  EVALUATION COMPLEXITY: Low   GOALS: Goals reviewed with patient? Yes  SHORT TERM GOALS: Target date: 03/23/2023    Pt will be I with HEP for trunk and hip flexibility  Baseline:given on eval  Goal status: INITIAL  2.  Pt will be able to self-manage symptoms with stretching at work to a degree.  Baseline: given tools today  Goal status: INITIAL   LONG TERM GOALS: Target date: 05/04/2023    Pt will be able to show I with HEP upon discharge  Baseline:  Goal status: INITIAL  2.  Pt will be abel to walk for 15 min without increasing back, leg pain  Baseline: 2 min pain increases with 5/10  Goal status: INITIAL  3.  FOTO score will improve to 65% or more to demo improved functional mobility  Baseline: 45% Goal status: INITIAL  4.  Pt will be able to improve trunk ROM rotation and flex/extension to WNL with only stretch  Baseline: limited in all planes see above  Goal status: INITIAL    PLAN:  PT FREQUENCY: 2x/week  PT DURATION: 8 weeks  PLANNED INTERVENTIONS: 97164- PT Re-evaluation, 97110-Therapeutic exercises, 97530- Therapeutic activity, 97112- Neuromuscular re-education, 97535- Self Care, 40981- Manual therapy, 97014- Electrical stimulation  (unattended), Patient/Family education, Dry Needling, Joint  mobilization, Spinal mobilization, Cryotherapy, and Moist heat.  PLAN FOR NEXT SESSION: check HEP , manual to Lumbar /hip. Address hip mobility, core , lifting    Malonie Tatum, PT 03/09/2023, 9:08 AM   Karie Mainland, PT 03/09/23 9:29 AM Phone: [phone] Fax: [phone]   For all possible CPT codes, reference the Planned Interventions line above.     Check all conditions that are expected to impact treatment: {Conditions expected to impact treatment:None of these apply   If treatment provided at initial evaluation, no treatment charged due to lack of authorization.    Karie Mainland, PT 03/17/23 7:48 AM Phone: [phone] Fax: [phone]

## 2023-03-17 ENCOUNTER — Ambulatory Visit: Payer: BC Managed Care – PPO | Admitting: Physical Therapy

## 2023-03-17 ENCOUNTER — Telehealth: Payer: Self-pay | Admitting: Physical Therapy

## 2023-03-17 NOTE — Telephone Encounter (Signed)
Left voicemail regarding no show. Left reminder of future appointments and reminder of attendance policy.

## 2023-03-19 NOTE — Therapy (Deleted)
OUTPATIENT PHYSICAL THERAPY THORACOLUMBAR EVALUATION   Patient Name: Donald Oliver MRN: [id number] DOB:1970-12-01, 52 y.o., male Today's Date: 03/19/2023  END OF SESSION:    Past Medical History:  Diagnosis Date   Abnormal CBC 10/08/2020   Anxiety 10/08/2020   Anxiety and depression 07/09/2020   Benign prostatic hyperplasia 09/05/2018   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 Laneta Simmers with LIMA to LAD, free RIMA to Diag and SVG to RCA     Eczema 09/05/2018   Encounter for health maintenance examination in adult 02/25/2015   Erectile dysfunction    Essential hypertension    Family history of premature CAD    Fatty liver disease, nonalcoholic 05/24/2018   Former smoker 08/29/2016   Gastroesophageal reflux disease 02/25/2015   High risk medication use 07/09/2020   History of substance abuse (HCC)    Pt had abused amphetamines : quit in 2005   Hypogonadism in male 02/07/2020   Impaired fasting blood sugar 2016   Insomnia 10/08/2020   Keloid scar of skin 06/21/2016   Lipoma 07/19/2019   Lipoma of left upper extremity 11/07/2022   Low testosterone in male 05/24/2018   Metatarsalgia of right foot 06/21/2016   Mixed dyslipidemia 09/05/2018   Onychomycosis    OSA on CPAP 10/31/2016   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   Screening for prostate cancer 07/09/2020   Shoulder mass 11/07/2022   Tinnitus of both ears 07/09/2020   Vaccine counseling 07/09/2020   Vapes nicotine containing substance 10/19/2021   Past Surgical History:  Procedure Laterality Date   CARDIAC CATHETERIZATION N/A 05/29/2015   Procedure: Left Heart Cath and Coronary Angiography;  Surgeon: Corky Crafts, MD;  Location: [facility] INVASIVE CV LAB;  Service: Cardiovascular;  Laterality: N/A;   CARDIAC CATHETERIZATION  05/29/2015   Procedure: Coronary Balloon Angioplasty;   Surgeon: Corky Crafts, MD;  Location: [facility] INVASIVE CV LAB;  Service: Cardiovascular;;   CORONARY ARTERY BYPASS GRAFT N/A 06/02/2015   Procedure: CORONARY ARTERY BYPASS GRAFTING (CABG) x  3 utilizing bilateral internal mammary artery and endoscopically harvested right sapheneous vein.;  Surgeon: Alleen Borne, MD;  Location: [facility] OR;  Service: Open Heart Surgery;  Laterality: N/A;   RHINOPLASTY  20065   w/repair of nasal fractuare; done at [facility]    TEE WITHOUT CARDIOVERSION N/A 06/02/2015   Procedure: TRANSESOPHAGEAL ECHOCARDIOGRAM (TEE);  Surgeon: Alleen Borne, MD;  Location: [facility] OR;  Service: Open Heart Surgery;  Laterality: N/A;   Patient Active Problem List   Diagnosis Date Noted   Lipoma of left upper extremity 11/07/2022   Shoulder mass 11/07/2022   Vapes nicotine containing substance 10/19/2021   Insomnia 10/08/2020   Abnormal CBC 10/08/2020   Anxiety 10/08/2020   Anxiety and depression 07/09/2020   Screening for prostate cancer 07/09/2020   High risk medication use 07/09/2020   Vaccine counseling 07/09/2020   Tinnitus of both ears 07/09/2020   Hypogonadism in male 02/07/2020   History of substance abuse (HCC)    Lipoma 07/19/2019   Bilateral high scrotal testes 05/23/2019   Mixed dyslipidemia 09/05/2018   Eczema 09/05/2018   Benign prostatic hyperplasia 09/05/2018   Low testosterone in male 05/24/2018   Fatty liver disease, nonalcoholic 05/24/2018   OSA on CPAP 10/31/2016   Former smoker 08/29/2016   Keloid scar of skin 06/21/2016  Metatarsalgia of right foot 06/21/2016   Onychomycosis 07/20/2015   Impaired fasting blood sugar 07/20/2015   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   CAD (coronary artery disease), native coronary artery 06/01/2015   Gastroesophageal reflux disease 02/25/2015   Encounter for health maintenance examination in adult 02/25/2015   Family history of premature CAD    Erectile dysfunction    Essential hypertension     PCP: Jac Canavan,  PA-C  REFERRING PROVIDER: Jac Canavan, PA-C  REFERRING DIAG: [phone] (ICD-10-CM) - Left-sided low back pain with left-sided sciatica, unspecified chronicity M54.42,G89.29 (ICD-10-CM) - Chronic left-sided low back pain with left-sided sciatica  Rationale for Evaluation and Treatment: Rehabilitation  THERAPY DIAG:  No diagnosis found.  ONSET DATE: 3-4 mos.   SUBJECTIVE:                                                                                                                                                                                           SUBJECTIVE STATEMENT: Pt reports 3 month history of low back pain in L side, radiating to the L buttock and post/lateral.  He has had episodes of numbness, tingling.  Pain does not travel below the knee. Leg pain increases with walking and standing. He denies weakness.  No pain in Rt side.  No XR no MRI. 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 Patient has sciatica  in the past briefly but it has gone away.  PERTINENT HISTORY:  Cardiac issues, history of recent weight loss of 40+ lbs this year  PAIN: Can be 7/10-8/10. He can relieve his pain but never completely 2/10 Are you having pain? Yes: NPRS scale: 5/10 Pain location: L low back to L glute  Pain description: achy and sore  Aggravating factors: lifting  Relieving factors: changing positions, Advil   PRECAUTIONS: None  RED FLAGS: None   WEIGHT BEARING RESTRICTIONS: No  FALLS:  Has patient fallen in last 6 months? No  LIVING ENVIRONMENT: Lives with:  mom, is divorced Lives in: House/apartment Stairs: No Has following equipment at home:  none   OCCUPATION: Works full time Network engineer.   PLOF: Independent  PATIENT GOALS: Pain relief to continue   NEXT MD VISIT: as needed   OBJECTIVE:  Note: Objective measures were completed at Evaluation unless otherwise noted.  DIAGNOSTIC FINDINGS:  XR  ordered  PATIENT SURVEYS:  FOTO 45%  SCREENING FOR RED FLAGS: NT COGNITION: Overall cognitive status: Within functional limits for tasks assessed     SENSATION: WFL  MUSCLE LENGTH: Hamstrings: passive SLR to 35-40 deg    POSTURE: rounded shoulders,  forward head, and increased thoracic kyphosis  PALPATION: Stiffness throughout thoracic and lumbar spine, pain along L4 L5 and lateral SI border.  Piriformis and glute med min tenderness   LUMBAR ROM:   AROM eval  Flexion Slight bend in knees, Stretch in legs and back  Extension Lacking 50% Stiff min pain   Right lateral flexion 50%   Left lateral flexion 50%   Right rotation 25%  Left rotation 25%   (Blank rows = not tested)  LOWER EXTREMITY ROM:     Passive  Right eval Left eval  Hip flexion 110 110  Hip extension    Hip abduction    Hip adduction    Hip internal rotation 20 10  Hip external rotation 35 35  Knee flexion    Knee extension    Ankle dorsiflexion    Ankle plantarflexion    Ankle inversion    Ankle eversion     (Blank rows = not tested)  LOWER EXTREMITY MMT:    MMT Right eval Left eval  Hip flexion 5 5  Hip extension 4+ 4+  Hip abduction 4+ 4+  Hip adduction    Hip internal rotation    Hip external rotation    Knee flexion 5 4+  Knee extension 5 5  Ankle dorsiflexion 5 5  Ankle plantarflexion    Ankle inversion    Ankle eversion     (Blank rows = not tested)  LUMBAR SPECIAL TESTS:  Straight leg raise test: Negative and Slump test: Positive SLR with increased hamstring tightness, pain in buttocks  FUNCTIONAL TESTS:  2 minute walk test: 470 feet, pain increased to 5/10 in L post lateral LW Unable to stand > 5 sec in SLS   GAIT: Distance walked: 470 Assistive device utilized: None Level of assistance: Complete Independence Comments: normal pace , no limp , no deviations  TODAY'S TREATMENT:                                                                                                                               DATE: 03/09/23   [facility] Adult PT Treatment:                                                DATE: 03/09/23 Therapeutic Exercise: Supine knee to chest to knee extension for hamstrings Hamstring stretch with strap Lower trunk rotation  Child pose  Self Care: HEP, spine mobility, hip mobility, POC   PATIENT EDUCATION:  Education details: see above  Person educated: Patient Education method: Explanation, Demonstration, and Handouts Education comprehension: verbalized understanding, returned demonstration, and needs further education  HOME EXERCISE PROGRAM: Access Code: [email or web address] URL: [email or web address] Date: 03/09/2023 Prepared by: Karie Mainland  Exercises - Cat Cow to Child's Pose  - 1 x daily - 7 x weekly - 1 sets -  5 reps - 30 hold - Standing 'L' Stretch at [employer]  - 1 x daily - 7 x weekly - 1 sets - 5 reps - 30 hold - Supine Lower Trunk Rotation  - 1 x daily - 7 x weekly - 2 sets - 10 reps - 10 hold - Supine Hamstring Stretch with Strap  - 1 x daily - 7 x weekly - 1 sets - 3 reps - 30 hold - Supine Single Knee to Chest Stretch  - 1 x daily - 7 x weekly - 1 sets - 5 reps - 30 hold - Supine Hamstring Stretch  - 1 x daily - 7 x weekly - 1 sets - 10 reps - 5 hold - Seated Hamstring Stretch  - 1 x daily - 7 x weekly - 1 sets - 3 reps - 30 hold  ASSESSMENT:  CLINICAL IMPRESSION: Patient is a 52 y.o. male  who was seen today for physical therapy evaluation and treatment for lumbar radiculopathy. Pt symptoms in prone did not aggravate or improve his LE pain but he was not hurting much at the time of this test.  He has good strength but sig. Stiffness in spine and hips.   OBJECTIVE IMPAIRMENTS: decreased mobility, difficulty walking, decreased ROM, increased fascial restrictions, impaired flexibility, improper body mechanics, postural dysfunction, pain, and core stability  .   ACTIVITY LIMITATIONS: carrying, lifting, bending, standing,  squatting, sleeping, and locomotion level  PARTICIPATION LIMITATIONS: shopping and community activity  PERSONAL FACTORS: Profession and 1-2 comorbidities: chronic pain, lack of exercise, cardiac  are also affecting patient's functional outcome.   REHAB POTENTIAL: Excellent  CLINICAL DECISION MAKING: Stable/uncomplicated  EVALUATION COMPLEXITY: Low   GOALS: Goals reviewed with patient? Yes  SHORT TERM GOALS: Target date: 03/23/2023    Pt will be I with HEP for trunk and hip flexibility  Baseline:given on eval  Goal status: INITIAL  2.  Pt will be able to self-manage symptoms with stretching at work to a degree.  Baseline: given tools today  Goal status: INITIAL   LONG TERM GOALS: Target date: 05/04/2023    Pt will be able to show I with HEP upon discharge  Baseline:  Goal status: INITIAL  2.  Pt will be abel to walk for 15 min without increasing back, leg pain  Baseline: 2 min pain increases with 5/10  Goal status: INITIAL  3.  FOTO score will improve to 65% or more to demo improved functional mobility  Baseline: 45% Goal status: INITIAL  4.  Pt will be able to improve trunk ROM rotation and flex/extension to WNL with only stretch  Baseline: limited in all planes see above  Goal status: INITIAL    PLAN:  PT FREQUENCY: 2x/week  PT DURATION: 8 weeks  PLANNED INTERVENTIONS: 97164- PT Re-evaluation, 97110-Therapeutic exercises, 97530- Therapeutic activity, 97112- Neuromuscular re-education, 97535- Self Care, 82956- Manual therapy, 97014- Electrical stimulation (unattended), Patient/Family education, Dry Needling, Joint mobilization, Spinal mobilization, Cryotherapy, and Moist heat.  PLAN FOR NEXT SESSION: check HEP , manual to Lumbar /hip. Address hip mobility, core , lifting    Denaly Gatling, PT 03/19/2023, 7:09 PM   Karie Mainland, PT 03/19/23 7:09 PM Phone: [phone] Fax: [phone]   For all possible CPT codes, reference the Planned Interventions  line above.     Check all conditions that are expected to impact treatment: {Conditions expected to impact treatment:None of these apply   If treatment provided at initial evaluation, no treatment charged due to lack of authorization.  Karie Mainland, PT 03/19/23 7:09 PM Phone: [phone] Fax: [phone]

## 2023-03-20 ENCOUNTER — Encounter: Payer: Self-pay | Admitting: Physical Therapy

## 2023-03-20 ENCOUNTER — Telehealth: Payer: Self-pay | Admitting: Physical Therapy

## 2023-03-20 NOTE — Telephone Encounter (Signed)
Called patient regarding his missed appt, he states he forgot about his appt.  He plans to come to his appt tomorrow. At this time we will discuss the attendance policy and maybe go down to 1 x per week depending on how his symptoms are.   Karie Mainland, PT 03/20/23 2:44 PM Phone: [phone] Fax: [phone]

## 2023-03-21 ENCOUNTER — Ambulatory Visit: Payer: BC Managed Care – PPO | Admitting: Physical Therapy

## 2023-03-28 ENCOUNTER — Ambulatory Visit: Payer: BC Managed Care – PPO | Admitting: Physical Therapy

## 2023-03-31 ENCOUNTER — Encounter: Payer: BC Managed Care – PPO | Admitting: Physical Therapy

## 2023-04-04 ENCOUNTER — Encounter: Payer: BC Managed Care – PPO | Admitting: Physical Therapy

## 2023-04-04 ENCOUNTER — Ambulatory Visit: Payer: BC Managed Care – PPO | Admitting: Medical

## 2023-04-04 VITALS — BP 150/100 | HR 90 | Temp 97.9°F | Ht 71.0 in | Wt 165.0 lb

## 2023-04-04 DIAGNOSIS — J988 Other specified respiratory disorders: Secondary | ICD-10-CM

## 2023-04-04 DIAGNOSIS — R051 Acute cough: Secondary | ICD-10-CM

## 2023-04-04 LAB — POCT INFLUENZA A/B
Influenza A, POC: NEGATIVE
Influenza B, POC: NEGATIVE

## 2023-04-04 LAB — POC COVID19 BINAXNOW: SARS Coronavirus 2 Ag: NEGATIVE

## 2023-04-04 MED ORDER — HYDROCODONE BIT-HOMATROP MBR 5-1.5 MG/5ML PO SOLN
5.0000 mL | Freq: Three times a day (TID) | ORAL | 0 refills | Status: AC | PRN
Start: 2023-04-04 — End: 2023-04-09

## 2023-04-04 NOTE — Patient Instructions (Signed)
 Symptoms suggest viral respiratory tract infection.  Screening labs showed negative COVID and flu test today.  I recommend good hydration such as the 100 ounces of water daily, rest, salt water gargles for sore throat  You can use Tylenol  for pain or not feeling well  Consider extra vitamin C and zinc such as over-the-counter emergenC immune plus vitamin pack this week  You can use over-the-counter Coricidin HBP or Mucinex  DM for cough and congestion or for worse cough you can use the Hycodan narcotic cough syrup prescribed today.  Caution as this can cause drowsiness.   If not much improved or worse by Friday particularly as fever, yellow-green mucus phlegm with cough, blood-tinged mucus, then call back for recheck

## 2023-04-04 NOTE — Progress Notes (Signed)
 Subjective:  Donald Oliver is a 53 y.o. male who presents for Chief Complaint  Patient presents with   Cough    Cough and trouble breathing, started Friday. No covid tests done.      Here for respiratory illness.  Started 4 days ago.   He notes cough, dyspnea, some body aches, no fever.  No chills.   No NVD.   Has some sinus pressure.   Has hoarse voice and some sore throat.  No productive cough.  Using some OTC cough syrup without much improvement.   No other aggravating or relieving factors.    No other c/o.  Past Medical History:  Diagnosis Date   Abnormal CBC 10/08/2020   Anxiety 10/08/2020   Anxiety and depression 07/09/2020   Benign prostatic hyperplasia 09/05/2018   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 Lucas with LIMA to LAD, free RIMA to Diag and SVG to RCA     Eczema 09/05/2018   Encounter for health maintenance examination in adult 02/25/2015   Erectile dysfunction    Essential hypertension    Family history of premature CAD    Fatty liver disease, nonalcoholic 05/24/2018   Former smoker 08/29/2016   Gastroesophageal reflux disease 02/25/2015   High risk medication use 07/09/2020   History of substance abuse (HCC)    Pt had abused amphetamines : quit in 2005   Hypogonadism in male 02/07/2020   Impaired fasting blood sugar 2016   Insomnia 10/08/2020   Keloid scar of skin 06/21/2016   Lipoma 07/19/2019   Lipoma of left upper extremity 11/07/2022   Low testosterone  in male 05/24/2018   Metatarsalgia of right foot 06/21/2016   Mixed dyslipidemia 09/05/2018   Onychomycosis    OSA on CPAP 10/31/2016   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 Pt needs office visit before future refills. 2nd Attempt 30 tablet 0   ASPIRIN  ADULT LOW DOSE 81 MG tablet TAKE 1 TABLET BY MOUTH DAILY *SWALLOW WHOLE* 90 tablet 3   buPROPion  (WELLBUTRIN  XL) 300 MG 24 hr tablet TAKE 1 TABLET BY MOUTH DAILY 30 tablet 5   ezetimibe  (ZETIA ) 10 MG tablet Take 1 tablet (10 mg total) by mouth daily. Pt needs office visit before future refills. 2nd attempt 30 tablet 0   guaifenesin  (ROBITUSSIN) 100 MG/5ML syrup Take 200 mg by mouth 3 (three) times daily as needed for cough.     lisinopril -hydrochlorothiazide  (ZESTORETIC ) 20-25 MG tablet Take 1 tablet by mouth daily. Pt needs office visit before future refills. 2nd Attempt 30 tablet 0   pantoprazole  (PROTONIX ) 40 MG tablet TAKE 1 TABLET BY MOUTH DAILY 90 tablet 2   rosuvastatin  (CRESTOR ) 20 MG tablet TAKE 1 TABLET BY MOUTH DAILY 90 tablet 3   Testosterone  1.62 % GEL APPLY 4 PUMPS TO THE SKIN DAILY AS DIRECTED 150 g 2   VASCEPA  1 g capsule Take 2 capsules (2 g total) by mouth 2 (two) times daily. 120 capsule 3   nitroGLYCERIN  (NITROSTAT )  0.4 MG SL tablet Place 0.4 mg under the tongue every 5 (five) minutes as needed for chest pain. (Patient not taking: Reported on 04/04/2023)     tadalafil  (CIALIS ) 5 MG tablet Take 1 tablet (5 mg total) by mouth daily as needed for erectile dysfunction. (Patient not taking: Reported on 04/04/2023) 90 tablet 3   No current facility-administered medications on file prior to visit.    The following portions of the patient's history were reviewed and updated as appropriate: allergies, current medications, past family history, past medical history, past social history, past surgical history and problem list.  ROS Otherwise as in subjective above    Objective: BP (!) 150/100   Pulse 90   Temp 97.9 F (36.6 C) (Tympanic)    Ht 5' 11 (1.803 m)   Wt 165 lb (74.8 kg)   SpO2 96%   BMI 23.01 kg/m   General appearance: alert, no distress, well developed, well nourished, somewhat ill appearing HEENT: normocephalic, sclerae anicteric, conjunctiva pink and moist, TMs flat, nares patent, no discharge or erythema, pharynx with mild erythema Oral cavity: MMM, no lesions Neck: supple, shoddy tender anterior nodes, no thyromegaly, no masses Heart: RRR, normal S1, S2, no murmurs Lungs: somewhat coarse, but, no wheezes, rhonchi, or rales    Assessment: Encounter Diagnoses  Name Primary?   Acute cough Yes   Respiratory tract infection      Plan: Symptoms suggest viral respiratory tract infection.  Screening labs showed negative COVID and flu test today.  I recommend good hydration such as the 100 ounces of water daily, rest, salt water gargles for sore throat  You can use Tylenol  for pain or not feeling well  Consider extra vitamin C and zinc such as over-the-counter emergenC immune plus vitamin pack this week  You can use over-the-counter Coricidin HBP or Mucinex  DM for cough and congestion or for worse cough you can use the Hycodan narcotic cough syrup prescribed today.  Caution as this can cause drowsiness.   If not much improved or worse by Friday particularly as fever, yellow-green mucus phlegm with cough, blood-tinged mucus, then call back for recheck  Donald Oliver was seen today for cough.  Diagnoses and all orders for this visit:  Acute cough -     POC COVID-19 -     Influenza A/B  Respiratory tract infection  Other orders -     HYDROcodone  bit-homatropine (HYCODAN) 5-1.5 MG/5ML syrup; Take 5 mLs by mouth every 8 (eight) hours as needed for up to 5 days for cough.    Follow up: prn

## 2023-04-10 ENCOUNTER — Ambulatory Visit: Payer: BC Managed Care – PPO | Admitting: Medical

## 2023-04-17 DIAGNOSIS — M47816 Spondylosis without myelopathy or radiculopathy, lumbar region: Secondary | ICD-10-CM | POA: Diagnosis not present

## 2023-06-27 ENCOUNTER — Ambulatory Visit: Admitting: Medical

## 2023-06-29 ENCOUNTER — Ambulatory Visit (INDEPENDENT_AMBULATORY_CARE_PROVIDER_SITE_OTHER): Admitting: Medical

## 2023-06-29 VITALS — BP 124/78 | HR 85 | Wt 160.8 lb

## 2023-06-29 DIAGNOSIS — Z113 Encounter for screening for infections with a predominantly sexual mode of transmission: Secondary | ICD-10-CM | POA: Diagnosis not present

## 2023-06-29 DIAGNOSIS — Z951 Presence of aortocoronary bypass graft: Secondary | ICD-10-CM

## 2023-06-29 DIAGNOSIS — E291 Testicular hypofunction: Secondary | ICD-10-CM

## 2023-06-29 DIAGNOSIS — E782 Mixed hyperlipidemia: Secondary | ICD-10-CM

## 2023-06-29 DIAGNOSIS — R7989 Other specified abnormal findings of blood chemistry: Secondary | ICD-10-CM | POA: Diagnosis not present

## 2023-06-29 DIAGNOSIS — Z7251 High risk heterosexual behavior: Secondary | ICD-10-CM | POA: Diagnosis not present

## 2023-06-29 DIAGNOSIS — I10 Essential (primary) hypertension: Secondary | ICD-10-CM

## 2023-06-29 MED ORDER — AMLODIPINE BESYLATE 10 MG PO TABS: 10.0000 mg | ORAL_TABLET | Freq: Every day | ORAL | 2 refills | Status: DC

## 2023-06-29 MED ORDER — PANTOPRAZOLE SODIUM 40 MG PO TBEC: 40.0000 mg | DELAYED_RELEASE_TABLET | Freq: Every day | ORAL | 2 refills | Status: DC

## 2023-06-29 MED ORDER — EZETIMIBE 10 MG PO TABS: 10.0000 mg | ORAL_TABLET | Freq: Every day | ORAL | 2 refills | Status: DC

## 2023-06-29 MED ORDER — LISINOPRIL-HYDROCHLOROTHIAZIDE 20-25 MG PO TABS: 1.0000 | ORAL_TABLET | Freq: Every day | ORAL | 2 refills | Status: DC

## 2023-06-29 MED ORDER — VASCEPA 1 G PO CAPS
2.0000 g | ORAL_CAPSULE | Freq: Two times a day (BID) | ORAL | 3 refills | Status: DC
Start: 2023-06-29 — End: 2023-11-15

## 2023-06-29 MED ORDER — TESTOSTERONE 1.62 % TD GEL: TRANSDERMAL | 2 refills | Status: DC

## 2023-06-29 MED ORDER — TADALAFIL 20 MG PO TABS
10.0000 mg | ORAL_TABLET | ORAL | 2 refills | Status: DC | PRN
Start: 2023-06-29 — End: 2023-08-24

## 2023-06-29 MED ORDER — BUPROPION HCL ER (XL) 300 MG PO TB24: 300.0000 mg | ORAL_TABLET | Freq: Every day | ORAL | 1 refills | Status: DC

## 2023-06-29 NOTE — Progress Notes (Signed)
 Subjective:  Donald Oliver is a 53 y.o. male who presents for Chief Complaint  Patient presents with   Medical Management of Chronic Issues    Med check and would like STD check. No symptoms     He would like STD test.  Has new partner and just ended a different relationship.   No symptoms of concern.  Needs prescription for Wellbutrin.  Running out.  Usually this works well for mood.  No recent worse issues.    Needs updated script for Tadalafil.   Wants to try different dose of Tadalafil.  Currently on 5mg  daily dosing.  Hyperlipidemia-he has been taking Crestor as he sometimes gets some muscle fatigue.  He says he will go back on will try again along coenzyme every 10.  He is taking Zetia and Vascepa  Compliant with BP medications.  Using testosterone 2 pumps each side daily.  This works well, and seems to be consistent compared to prior testim and other products  No other aggravating or relieving factors.    No other c/o.  Past Medical History:  Diagnosis Date   Abnormal CBC 10/08/2020   Anxiety 10/08/2020   Anxiety and depression 07/09/2020   Benign prostatic hyperplasia 09/05/2018   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 Laneta Simmers with LIMA to LAD, free RIMA to Diag and SVG to RCA     Eczema 09/05/2018   Encounter for health maintenance examination in adult 02/25/2015   Erectile dysfunction    Essential hypertension    Family history of premature CAD    Fatty liver disease, nonalcoholic 05/24/2018   Former smoker 08/29/2016   Gastroesophageal reflux disease 02/25/2015   High risk medication use 07/09/2020   History of substance abuse (HCC)    Pt had abused amphetamines : quit in 2005   Hypogonadism in male 02/07/2020   Impaired fasting blood sugar 2016   Insomnia 10/08/2020   Keloid scar of skin 06/21/2016    Lipoma 07/19/2019   Lipoma of left upper extremity 11/07/2022   Low testosterone in male 05/24/2018   Metatarsalgia of right foot 06/21/2016   Mixed dyslipidemia 09/05/2018   Onychomycosis    OSA on CPAP 10/31/2016   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 (Patient not taking: Reported on 04/04/2023)     No current facility-administered medications on file prior to visit.     The following portions of the patient's history were reviewed and updated as appropriate: allergies, current medications, past family history, past medical history, past social history, past surgical history and problem list.  ROS Otherwise as in subjective above    Objective: BP 124/78   Pulse 85   Wt 160 lb 12.8 oz (72.9 kg)   BMI 22.43  kg/m   General appearance: alert, no distress, well developed, well nourished Neck: supple, no lymphadenopathy, no thyromegaly, no masses Heart: RRR, normal S1, S2, no murmurs Lungs: CTA bilaterally, no wheezes, rhonchi, or rales Pulses: 2+ radial pulses, 2+ pedal pulses, normal cap refill Ext: no edema   Assessment: Encounter Diagnoses  Name Primary?   Hypogonadism in male Yes   Unprotected sex    Screen for STD (sexually transmitted disease)    S/P CABG x 3    Mixed dyslipidemia    Essential hypertension    Low testosterone      Plan: Hypogonadism, low testosterone-continue AndroGel 2 pumps each shoulder  and upper arm daily.  Does fine on this.  Labs today.  Screen for STD, unprotected sex-counseled on condom use, prevention, regular testing  Mixed dyslipidemia, status post CABG-continue Crestor 20 mg daily, aspirin 81 mg daily, Zetia 10 mg daily and Vascepa  Hypertension-continue amlodipine 10 mg daily, lisinopril HCT 20/25 mg daily  History of anxiety depression-doing fine on Wellbutrin 300mg  XL daily, continue current medication   Donald "Susy Frizzle" was seen today for medical management of chronic issues.  Diagnoses and all orders for this visit:  Hypogonadism in male -     Testosterone  Unprotected sex -     RPR+HIV+GC+CT Panel -     Hepatitis C antibody -     Hepatitis B surface antigen  Screen for STD (sexually transmitted disease) -     RPR+HIV+GC+CT Panel -     Hepatitis C antibody -     Hepatitis B surface antigen  S/P CABG x 3  Mixed dyslipidemia  Essential hypertension  Low testosterone -     Testosterone  Other orders -     buPROPion (WELLBUTRIN XL) 300 MG 24 hr tablet; Take 1 tablet (300 mg total) by mouth daily. -     tadalafil (CIALIS) 20 MG tablet; Take 0.5-1 tablets (10-20 mg total) by mouth every other day as needed for erectile dysfunction. -     pantoprazole (PROTONIX) 40 MG tablet; Take 1 tablet (40 mg total) by mouth daily. -     amLODipine (NORVASC) 10 MG tablet; Take 1 tablet (10 mg total) by mouth daily. -     lisinopril-hydrochlorothiazide (ZESTORETIC) 20-25 MG tablet; Take 1 tablet by mouth daily. -     VASCEPA 1 g capsule; Take 2 capsules (2 g total) by mouth 2 (two) times daily. -     Testosterone 1.62 % GEL; APPLY 4 PUMPS TO THE SKIN DAILY AS DIRECTED -     ezetimibe (ZETIA) 10 MG tablet; Take 1 tablet (10 mg total) by mouth daily.    Follow up: pending labs

## 2023-06-30 ENCOUNTER — Other Ambulatory Visit (HOSPITAL_COMMUNITY): Payer: Self-pay

## 2023-06-30 ENCOUNTER — Telehealth: Payer: Self-pay

## 2023-06-30 NOTE — Progress Notes (Signed)
 Results sent through MyChart

## 2023-06-30 NOTE — Telephone Encounter (Signed)
 Pharmacy Patient Advocate Encounter   Received notification from CoverMyMeds that prior authorization for Testosterone 1.62% gel is required/requested.   Insurance verification completed.   The patient is insured through [facility] .   Per test claim: PA required; PA submitted to above mentioned insurance via CoverMyMeds Key/confirmation #/EOC (Key: [id number])  Status is pending

## 2023-07-02 LAB — HEPATITIS B SURFACE ANTIGEN: Hepatitis B Surface Ag: NEGATIVE

## 2023-07-02 LAB — RPR+HIV+GC+CT PANEL
Chlamydia trachomatis, NAA: NEGATIVE
HIV Screen 4th Generation wRfx: NONREACTIVE
Neisseria Gonorrhoeae by PCR: NEGATIVE
RPR Ser Ql: NONREACTIVE

## 2023-07-02 LAB — HEPATITIS C ANTIBODY: Hep C Virus Ab: NONREACTIVE

## 2023-07-02 LAB — TESTOSTERONE: Testosterone: 927 ng/dL — ABNORMAL HIGH (ref 264–916)

## 2023-07-03 ENCOUNTER — Other Ambulatory Visit (HOSPITAL_COMMUNITY): Payer: Self-pay

## 2023-07-03 NOTE — Progress Notes (Signed)
 Results sent through MyChart

## 2023-07-03 NOTE — Telephone Encounter (Signed)
 Pharmacy Patient Advocate Encounter  Received notification from [facility] that Prior Authorization for Testosterone 1.62% gel  has been APPROVED from 4.4.25 to 4.4.26 . Cost is around about 70.00 for 2months   PA #/Case ID/Reference #: (Key: [id number])

## 2023-08-24 ENCOUNTER — Other Ambulatory Visit: Payer: Self-pay | Admitting: Medical

## 2023-08-24 MED ORDER — TADALAFIL 20 MG PO TABS: 10.0000 mg | ORAL_TABLET | ORAL | 2 refills | Status: DC | PRN

## 2023-09-25 ENCOUNTER — Other Ambulatory Visit (HOSPITAL_COMMUNITY): Payer: Self-pay

## 2023-09-25 ENCOUNTER — Telehealth: Payer: Self-pay

## 2023-09-25 NOTE — Telephone Encounter (Signed)
 Pharmacy Patient Advocate Encounter   Received notification from CoverMyMeds that prior authorization for Vascepa  1GM capsules is required/requested.   Insurance verification completed.   The patient is insured through [facility] .   Per test claim: PA required; PA submitted to above mentioned insurance via CoverMyMeds Key/confirmation #/EOC (Key: [id number])     Status is pending

## 2023-09-26 ENCOUNTER — Other Ambulatory Visit (HOSPITAL_COMMUNITY): Payer: Self-pay

## 2023-09-26 NOTE — Telephone Encounter (Signed)
 Pharmacy Patient Advocate Encounter  Received notification from [facility] that Prior Authorization for Vascepa  1GM capsules has been APPROVED from 6.30.25 to 6.30.26. Ran test claim, Copay is $35.00. This test claim was processed through [facility]- copay amounts may vary at other pharmacies due to pharmacy/plan contracts, or as the patient moves through the different stages of their insurance plan.   PA #/Case ID/Reference #: (Key: [id number])

## 2023-10-11 DIAGNOSIS — G4733 Obstructive sleep apnea (adult) (pediatric): Secondary | ICD-10-CM | POA: Diagnosis not present

## 2023-10-11 DIAGNOSIS — Z1211 Encounter for screening for malignant neoplasm of colon: Secondary | ICD-10-CM | POA: Diagnosis not present

## 2023-10-19 LAB — COLOGUARD: COLOGUARD: NEGATIVE

## 2023-10-20 ENCOUNTER — Ambulatory Visit: Payer: Self-pay | Admitting: Medical

## 2023-10-20 NOTE — Progress Notes (Signed)
Results through My Chart

## 2023-10-24 ENCOUNTER — Encounter: Payer: Self-pay | Admitting: Medical

## 2023-10-24 ENCOUNTER — Ambulatory Visit (INDEPENDENT_AMBULATORY_CARE_PROVIDER_SITE_OTHER): Admitting: Medical

## 2023-10-24 VITALS — BP 130/80 | HR 97 | Temp 97.8°F | Wt 161.8 lb

## 2023-10-24 DIAGNOSIS — I889 Nonspecific lymphadenitis, unspecified: Secondary | ICD-10-CM | POA: Diagnosis not present

## 2023-10-24 DIAGNOSIS — J029 Acute pharyngitis, unspecified: Secondary | ICD-10-CM

## 2023-10-24 LAB — POCT INFLUENZA A/B
Influenza A, POC: NEGATIVE
Influenza B, POC: NEGATIVE

## 2023-10-24 LAB — POCT MONO (EPSTEIN BARR VIRUS): Mono, POC: NEGATIVE

## 2023-10-24 LAB — POCT RAPID STREP A (OFFICE): Rapid Strep A Screen: NEGATIVE

## 2023-10-24 LAB — POC COVID19 BINAXNOW: SARS Coronavirus 2 Ag: NEGATIVE

## 2023-10-24 MED ORDER — AZITHROMYCIN 250 MG PO TABS: ORAL_TABLET | ORAL | 0 refills | Status: DC

## 2023-10-24 NOTE — Progress Notes (Signed)
 Subjective:  Donald Oliver is a 53 y.o. male who presents for Chief Complaint  Patient presents with   Acute Visit    Flu like symptoms- symptoms started Sunday. Fever, swollen glands, cough, sore throat, headache, some SOB. Achy      Here for flu like symptoms x 2-3 days.  Symptoms include swollen glands, some cough, sore throat moderate, sob some, achy, fever subjective.   No NVD.   Not much appetite.   No sick contacts, but around a lot of people.   Non productive cough.   Using tylenol .     No recent sick children exposure.  No contacts with strep flu or covid.  No other aggravating or relieving factors.    No other c/o.  Past Medical History:  Diagnosis Date   Abnormal CBC 10/08/2020   Anxiety 10/08/2020   Anxiety and depression 07/09/2020   Benign prostatic hyperplasia 09/05/2018   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 Lucas with LIMA to LAD, free RIMA to Diag and SVG to RCA     Eczema 09/05/2018   Encounter for health maintenance examination in adult 02/25/2015   Erectile dysfunction    Essential hypertension    Family history of premature CAD    Fatty liver disease, nonalcoholic 05/24/2018   Former smoker 08/29/2016   Gastroesophageal reflux disease 02/25/2015   High risk medication use 07/09/2020   History of substance abuse (HCC)    Pt had abused amphetamines : quit in 2005   Hypogonadism in male 02/07/2020   Impaired fasting blood sugar 2016   Insomnia 10/08/2020   Keloid scar of skin 06/21/2016   Lipoma 07/19/2019   Lipoma of left upper extremity 11/07/2022   Low testosterone  in male 05/24/2018   Metatarsalgia of right foot 06/21/2016   Mixed dyslipidemia 09/05/2018   Onychomycosis    OSA on CPAP 10/31/2016   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 90 tablet 2   ASPIRIN  ADULT LOW DOSE 81 MG tablet TAKE 1 TABLET BY MOUTH DAILY *SWALLOW WHOLE* 90 tablet 3   buPROPion  (WELLBUTRIN  XL) 300 MG 24 hr tablet Take 1 tablet (300 mg total) by mouth daily. 90 tablet 1   ezetimibe  (ZETIA ) 10 MG tablet Take 1 tablet (10 mg total) by mouth daily. 90 tablet 2   lisinopril -hydrochlorothiazide  (ZESTORETIC ) 20-25 MG tablet Take 1 tablet by mouth daily. 90 tablet 2   pantoprazole  (PROTONIX ) 40 MG tablet Take 1 tablet (40 mg total) by mouth daily. 90 tablet 2   rosuvastatin  (CRESTOR ) 20 MG tablet TAKE 1 TABLET BY MOUTH DAILY 90 tablet 3   tadalafil  (CIALIS ) 20 MG tablet Take 0.5-1 tablets (10-20 mg total) by mouth every other day as needed for erectile dysfunction. 30 tablet 2   Testosterone  1.62 % GEL APPLY 4 PUMPS TO THE SKIN DAILY AS DIRECTED 150 g 2   VASCEPA  1 g capsule Take 2 capsules (2 g total) by mouth 2 (two) times daily. 120 capsule 3  nitroGLYCERIN  (NITROSTAT ) 0.4 MG SL tablet Place 0.4 mg under the tongue every 5 (five) minutes as needed for chest pain. (Patient not taking: Reported on 04/04/2023)     No current facility-administered medications on file prior to visit.     The following portions of the patient's history were reviewed and updated as appropriate: allergies, current medications, past family history, past medical history, past social history, past surgical history and problem list.  ROS Otherwise as in subjective above     Objective: BP 130/80   Pulse 97   Temp 97.8 F (36.6 C)   Wt 161 lb 12.8 oz (73.4 kg)   SpO2 97%   BMI 22.57 kg/m   Wt Readings from Last 3 Encounters:  10/24/23 161 lb 12.8 oz (73.4 kg)  06/29/23 160 lb  12.8 oz (72.9 kg)  04/04/23 165 lb (74.8 kg)    General appearance: alert, no distress, well developed, well nourished, somewhat ill-appearing HEENT: normocephalic, sclerae anicteric, conjunctiva pink and moist, TMs pearly, nares with turbinate edema, clear to mucoid discharge,+erythema, pharynx with moderate erythema throughout but no obvious peritonsillar abscess  oral cavity: MMM, no lesions Neck: supple, shotty tender anterior nodes, no thyromegaly, no masses Heart: RRR, normal S1, S2, no murmurs Lungs: CTA bilaterally, no wheezes, rhonchi, or rales Pulses: 2+ radial pulses, 2+ pedal pulses, normal cap refill Ext: no edema   Assessment: Encounter Diagnoses  Name Primary?   Sore throat Yes   Lymphadenitis      Plan: His initial tests are negative for flu and COVID and strep.  Monotest also done which was negative.  His throat really got worse in the last 24 hours we could have a false negative strep swab.  Given the clinical picture I am going to treat him for potential strep infection.  Begin azithromycin .  Advise salt water gargles, warm fluids, can alternate Tylenol  and ibuprofen.  Discussed potential complications.  If much worse in the next few days call or recheck.    Donald Oliver was seen today for acute visit.  Diagnoses and all orders for this visit:  Sore throat -     Rapid Strep A -     Influenza A/B -     POC COVID-19 -     Mono (Epstein Barr Virus)  Lymphadenitis  Other orders -     azithromycin  (ZITHROMAX ) 250 MG tablet; 2 tablets day 1, then 1 tablet days 2-5    Follow up: prn

## 2023-10-25 ENCOUNTER — Encounter: Payer: BC Managed Care – PPO | Admitting: Medical

## 2023-10-26 ENCOUNTER — Telehealth: Payer: Self-pay | Admitting: Internal Medicine

## 2023-10-26 NOTE — Telephone Encounter (Unsigned)
 Copied from CRM [id number]. Topic: Clinical - Prescription Issue >> Oct 26, 2023  9:25 AM Graeme ORN wrote: Reason for CRM: Patient called states he was seen and prescribed antibiotics. Unable to find them now. Took two so far - last one taken  Tuesday.  [facility] [id number] [place], [place] - [address] [place] [address]

## 2023-10-27 ENCOUNTER — Other Ambulatory Visit: Payer: Self-pay | Admitting: Medical

## 2023-10-27 MED ORDER — AZITHROMYCIN 500 MG PO TABS: 500.0000 mg | ORAL_TABLET | Freq: Every day | ORAL | 0 refills | Status: DC

## 2023-11-15 ENCOUNTER — Encounter: Payer: Self-pay | Admitting: Medical

## 2023-11-15 ENCOUNTER — Ambulatory Visit: Admitting: Medical

## 2023-11-15 VITALS — BP 132/78 | HR 79 | Ht 69.0 in | Wt 165.0 lb

## 2023-11-15 DIAGNOSIS — R7989 Other specified abnormal findings of blood chemistry: Secondary | ICD-10-CM

## 2023-11-15 DIAGNOSIS — R8290 Unspecified abnormal findings in urine: Secondary | ICD-10-CM

## 2023-11-15 DIAGNOSIS — Z87891 Personal history of nicotine dependence: Secondary | ICD-10-CM

## 2023-11-15 DIAGNOSIS — Z125 Encounter for screening for malignant neoplasm of prostate: Secondary | ICD-10-CM | POA: Diagnosis not present

## 2023-11-15 DIAGNOSIS — I2510 Atherosclerotic heart disease of native coronary artery without angina pectoris: Secondary | ICD-10-CM

## 2023-11-15 DIAGNOSIS — Z0000 Encounter for general adult medical examination without abnormal findings: Secondary | ICD-10-CM | POA: Diagnosis not present

## 2023-11-15 DIAGNOSIS — I10 Essential (primary) hypertension: Secondary | ICD-10-CM

## 2023-11-15 DIAGNOSIS — Z7185 Encounter for immunization safety counseling: Secondary | ICD-10-CM

## 2023-11-15 DIAGNOSIS — N400 Enlarged prostate without lower urinary tract symptoms: Secondary | ICD-10-CM

## 2023-11-15 DIAGNOSIS — E291 Testicular hypofunction: Secondary | ICD-10-CM | POA: Diagnosis not present

## 2023-11-15 DIAGNOSIS — E782 Mixed hyperlipidemia: Secondary | ICD-10-CM

## 2023-11-15 DIAGNOSIS — G4733 Obstructive sleep apnea (adult) (pediatric): Secondary | ICD-10-CM

## 2023-11-15 DIAGNOSIS — Z720 Tobacco use: Secondary | ICD-10-CM

## 2023-11-15 DIAGNOSIS — R7301 Impaired fasting glucose: Secondary | ICD-10-CM

## 2023-11-15 DIAGNOSIS — F419 Anxiety disorder, unspecified: Secondary | ICD-10-CM | POA: Diagnosis not present

## 2023-11-15 DIAGNOSIS — Z122 Encounter for screening for malignant neoplasm of respiratory organs: Secondary | ICD-10-CM | POA: Diagnosis not present

## 2023-11-15 DIAGNOSIS — N529 Male erectile dysfunction, unspecified: Secondary | ICD-10-CM

## 2023-11-15 DIAGNOSIS — K219 Gastro-esophageal reflux disease without esophagitis: Secondary | ICD-10-CM

## 2023-11-15 DIAGNOSIS — Z951 Presence of aortocoronary bypass graft: Secondary | ICD-10-CM

## 2023-11-15 LAB — POCT URINALYSIS DIP (PROADVANTAGE DEVICE)
Bilirubin, UA: NEGATIVE
Blood, UA: NEGATIVE
Glucose, UA: NEGATIVE mg/dL
Ketones, POC UA: NEGATIVE mg/dL
Leukocytes, UA: NEGATIVE
Nitrite, UA: NEGATIVE
Protein Ur, POC: 30 mg/dL — AB
Specific Gravity, Urine: 1.01
Urobilinogen, Ur: 0.2
pH, UA: 6 (ref 5.0–8.0)

## 2023-11-15 LAB — LIPID PANEL

## 2023-11-15 MED ORDER — LISINOPRIL-HYDROCHLOROTHIAZIDE 20-25 MG PO TABS: 1.0000 | ORAL_TABLET | Freq: Every day | ORAL | 2 refills | Status: AC

## 2023-11-15 MED ORDER — TADALAFIL 20 MG PO TABS: 10.0000 mg | ORAL_TABLET | ORAL | 2 refills | Status: AC | PRN

## 2023-11-15 MED ORDER — ASPIRIN 81 MG PO TBEC: 81.0000 mg | DELAYED_RELEASE_TABLET | Freq: Every day | ORAL | 3 refills | Status: AC

## 2023-11-15 MED ORDER — VASCEPA 1 G PO CAPS: 2.0000 g | ORAL_CAPSULE | Freq: Two times a day (BID) | ORAL | 3 refills | Status: AC

## 2023-11-15 MED ORDER — BUPROPION HCL ER (XL) 150 MG PO TB24: 150.0000 mg | ORAL_TABLET | Freq: Every day | ORAL | 0 refills | Status: DC

## 2023-11-15 MED ORDER — ROSUVASTATIN CALCIUM 20 MG PO TABS
20.0000 mg | ORAL_TABLET | Freq: Every day | ORAL | 3 refills | Status: AC
Start: 2023-11-15 — End: ?

## 2023-11-15 NOTE — Patient Instructions (Signed)
 This visit was a preventative care visit, also known as wellness visit or routine physical.   Topics typically include healthy lifestyle, diet, exercise, preventative care, vaccinations, sick and well care, proper use of emergency dept and after hours care, as well as other concerns.     Recommendations: Continue to return yearly for your annual wellness and preventative care visits.  This gives us  a chance to discuss healthy lifestyle, exercise, vaccinations, review your chart record, and perform screenings where appropriate.  I recommend you see your eye doctor yearly for routine vision care.  I recommend you see your dentist yearly for routine dental care including hygiene visits twice yearly.   Vaccination recommendations were reviewed Immunization History  Administered Date(s) Administered   Influenza Split 01/28/2015   Influenza,inj,Quad PF,6+ Mos 12/17/2015   Influenza-Unspecified 01/28/2015, 02/12/2017   Pneumococcal Polysaccharide-23 12/17/2015, 07/09/2020   Tdap 04/01/2015   I recommend a yearly flu shot in the fall I recommend Shingrix vaccine I recommend updated covid booster when available   Screening for cancer: Colon cancer screening: I reviewed your Cologuard 09/2023, negative  We discussed PSA, prostate exam, and prostate cancer screening risks/benefits.     Skin cancer screening: Check your skin regularly for new changes, growing lesions, or other lesions of concern Come in for evaluation if you have skin lesions of concern.  Lung cancer screening: If you have a greater than 20 pack year history of tobacco use, then you may qualify for lung cancer screening with a chest CT scan.   Please call your insurance company to inquire about coverage for this test.  We currently don't have screenings for other cancers besides breast, cervical, colon, and lung cancers.  If you have a strong family history of cancer or have other cancer screening concerns, please let me  know.    Bone health: Get at least 150 minutes of aerobic exercise weekly Get weight bearing exercise at least once weekly Bone density test:  A bone density test is an imaging test that uses a type of X-ray to measure the amount of calcium  and other minerals in your bones. The test may be used to diagnose or screen you for a condition that causes weak or thin bones (osteoporosis), predict your risk for a broken bone (fracture), or determine how well your osteoporosis treatment is working. The bone density test is recommended for females 65 and older, or females or males <65 if certain risk factors such as thyroid  disease, long term use of steroids such as for asthma or rheumatological issues, vitamin D  deficiency, estrogen deficiency, family history of osteoporosis, self or family history of fragility fracture in first degree relative.    Heart health: Get at least 150 minutes of aerobic exercise weekly Limit alcohol It is important to maintain a healthy blood pressure and healthy cholesterol numbers     Medical care options: I recommend you continue to seek care here first for routine care.  We try really hard to have available appointments Monday through Friday daytime hours for sick visits, acute visits, and physicals.  Urgent care should be used for after hours and weekends for significant issues that cannot wait till the next day.  The emergency department should be used for significant potentially life-threatening emergencies.  The emergency department is expensive, can often have long wait times for less significant concerns, so try to utilize primary care, urgent care, or telemedicine when possible to avoid unnecessary trips to the emergency department.  Virtual visits and telemedicine have  been introduced since the pandemic started in 2020, and can be convenient ways to receive medical care.  We offer virtual appointments as well to assist you in a variety of options to seek medical  care.    Separate significant issues discussed:  OSA - uses CPAP some, has some noncompliance  Still vaping. Advised cessation.    Urine odor - protein in urine today but no obvious infection.  Hydrate well with water daily.  Lung cancer screening - updated chest CT order today  Mixed dyslipidemia-labs today, advised to continue Zetia , Crestor , vascepa    Low testosterone  - compliant with testosterone  gel 3 total pumps daily  ED, BPH - Continue Cialis   Hypertension-he hasn't taken any BP medication in several weeks.  Restart at least 1/2 tablet of Lisinopril  HCT 20/25mg  daily.  He is going to hold off on Amlodipine  for now.  GERD- advised Protonix  prn  Low testosterone , hypogonadism - continue testosterone  topical.  Currently using 3 pumps daily.  Depression and anxiety - restart wellbutrin .

## 2023-11-15 NOTE — Progress Notes (Signed)
 Subjective:   HPI  Donald Oliver is a 53 y.o. male who presents for Chief Complaint  Patient presents with   Annual Exam    Patient Care Team: Britaney Espaillat, Alm GORMAN RIGGERS as PCP - General (Family Medicine) Sees dentist Sees eye doctor Dr. Redell Leiter, cardiology Dr. Magdalen Dickens, podiatry Dr. Lonni Angle, ENT   Concerns: Fasting today  Got off track with medications recently.   Has had some compliance in recent weeks.   He just quit taking some of his medication  Needs back on Wellbutrin , more depressed of late.  Gets this way from time to time.  Working full time  Has headaches some  Urine strange smell.  No burning with urination, no frequency, no urgency  Had covid in March.  Get worried sensation of smell or taste at times.    Still vapes tobacco  Using his testosterone  regularly  No other complaint  Reviewed their medical, surgical, family, social, medication, and allergy history and updated chart as appropriate.  Past Medical History:  Diagnosis Date   Abnormal CBC 10/08/2020   Anxiety 10/08/2020   Anxiety and depression 07/09/2020   Benign prostatic hyperplasia 09/05/2018   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 Lucas with LIMA to LAD, free RIMA to Diag and SVG to RCA     Eczema 09/05/2018   Encounter for health maintenance examination in adult 02/25/2015   Erectile dysfunction    Essential hypertension    Family history of premature CAD    Fatty liver disease, nonalcoholic 05/24/2018   Former smoker 08/29/2016   Gastroesophageal reflux disease 02/25/2015   High risk medication use 07/09/2020   History of substance abuse (HCC)    Pt had abused amphetamines : quit in 2005   Hypogonadism in male 02/07/2020   Impaired fasting blood sugar 2016   Insomnia 10/08/2020   Keloid scar of skin  06/21/2016   Lipoma 07/19/2019   Lipoma of left upper extremity 11/07/2022   Low testosterone  in male 05/24/2018   Metatarsalgia of right foot 06/21/2016   Mixed dyslipidemia 09/05/2018   Onychomycosis    OSA on CPAP 10/31/2016   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   Screening for prostate cancer 07/09/2020   Shoulder mass 11/07/2022   Tinnitus of both ears 07/09/2020   Vaccine counseling 07/09/2020   Vapes nicotine  containing substance 10/19/2021    Past Surgical History:  Procedure Laterality Date   CARDIAC CATHETERIZATION N/A 05/29/2015   Procedure: Left Heart Cath and Coronary Angiography;  Surgeon: Candyce GORMAN Reek, MD;  Location: [facility] INVASIVE CV LAB;  Service: Cardiovascular;  Laterality: N/A;   CARDIAC CATHETERIZATION  05/29/2015   Procedure: Coronary Balloon Angioplasty;  Surgeon: Candyce GORMAN Reek, MD;  Location: [facility] INVASIVE CV LAB;  Service: Cardiovascular;;   CORONARY ARTERY BYPASS GRAFT N/A 06/02/2015   Procedure: CORONARY ARTERY BYPASS GRAFTING (CABG) x  3 utilizing bilateral internal mammary artery and endoscopically harvested right sapheneous vein.;  Surgeon: Dorise MARLA Lucas, MD;  Location: [facility] OR;  Service: Open Heart Surgery;  Laterality: N/A;   RHINOPLASTY  [id number]   w/repair of nasal fractuare; done at [facility]    TEE WITHOUT CARDIOVERSION N/A 06/02/2015   Procedure: TRANSESOPHAGEAL ECHOCARDIOGRAM (TEE);  Surgeon: Dorise MARLA Lucas, MD;  Location: [facility] OR;  Service: Open Heart Surgery;  Laterality: N/A;  Family History  Problem Relation Age of Onset   Heart failure Father    Heart disease Father 31       died of MI   Diabetes Father    Heart disease Mother        CAD, stents   Diabetes Mother    HIV Sister    Cancer Maternal Aunt        lung, smoker   Stroke Neg Hx      Current Outpatient Medications:    buPROPion  (WELLBUTRIN  XL) 150 MG 24 hr tablet, Take 1 tablet (150 mg total) by mouth daily., Disp: 90 tablet, Rfl: 0   nitroGLYCERIN  (NITROSTAT ) 0.4 MG SL tablet, Place  0.4 mg under the tongue every 5 (five) minutes as needed for chest pain., Disp: , Rfl:    pantoprazole  (PROTONIX ) 40 MG tablet, Take 1 tablet (40 mg total) by mouth daily., Disp: 90 tablet, Rfl: 2   Testosterone  1.62 % GEL, APPLY 4 PUMPS TO THE SKIN DAILY AS DIRECTED, Disp: 150 g, Rfl: 2   aspirin  EC (ASPIRIN  ADULT LOW DOSE) 81 MG tablet, Take 1 tablet (81 mg total) by mouth daily. Swallow whole., Disp: 90 tablet, Rfl: 3   ezetimibe  (ZETIA ) 10 MG tablet, Take 1 tablet (10 mg total) by mouth daily. (Patient not taking: Reported on 11/15/2023), Disp: 90 tablet, Rfl: 2   lisinopril -hydrochlorothiazide  (ZESTORETIC ) 20-25 MG tablet, Take 1 tablet by mouth daily., Disp: 90 tablet, Rfl: 2   rosuvastatin  (CRESTOR ) 20 MG tablet, Take 1 tablet (20 mg total) by mouth daily., Disp: 90 tablet, Rfl: 3   tadalafil  (CIALIS ) 20 MG tablet, Take 0.5-1 tablets (10-20 mg total) by mouth every other day as needed for erectile dysfunction., Disp: 30 tablet, Rfl: 2   VASCEPA  1 g capsule, Take 2 capsules (2 g total) by mouth 2 (two) times daily., Disp: 120 capsule, Rfl: 3  Allergies  Allergen Reactions   Amoxicillin Swelling    Hand swelling - as a teenager Has patient had a PCN reaction causing immediate rash, facial/tongue/throat swelling, SOB or lightheadedness with hypotension: Yes Has patient had a PCN reaction causing severe rash involving mucus membranes or skin necrosis: No Has patient had a PCN reaction that required hospitalization No Has patient had a PCN reaction occurring within the last 10 years: No If all of the above answers are NO, then may proceed with Cephalosporin use. Hand swelling - as a teenager Has patient had a PCN reaction causing immediate rash, facial/tongue/throat swelling, SOB or lightheadedness with hypotension: Yes Has patient had a PCN reaction causing severe rash involving mucus membranes or skin necrosis: No Has patient had a PCN reaction that required hospitalization No Has patient  had a PCN reaction occurring within the last 10 years: No If all of the above answers are NO, then may proceed with Cephalosporin use.   Fluoxetine  Other (See Comments)    Cloudy mentation, sexual side effects  Cloudy mentation, sexual side effects    Review of Systems  Constitutional:  Negative for chills, fever, malaise/fatigue and weight loss.  HENT:  Negative for congestion, ear pain, hearing loss, sore throat and tinnitus.   Eyes:  Negative for blurred vision, pain and redness.  Respiratory:  Negative for cough, hemoptysis and shortness of breath.   Cardiovascular:  Negative for chest pain, palpitations, orthopnea, claudication and leg swelling.  Gastrointestinal:  Negative for abdominal pain, blood in stool, constipation, diarrhea, nausea and vomiting.  Genitourinary:  Negative for dysuria, flank pain, frequency, hematuria  and urgency.       Urine odor  Musculoskeletal:  Negative for falls, joint pain and myalgias.  Skin:  Negative for itching and rash.  Neurological:  Positive for headaches. Negative for dizziness, tingling, speech change and weakness.  Endo/Heme/Allergies:  Negative for polydipsia. Does not bruise/bleed easily.  Psychiatric/Behavioral:  Negative for depression and memory loss. The patient is not nervous/anxious and does not have insomnia.         11/15/2023    2:08 PM 10/24/2022    8:36 AM 05/11/2022    9:49 AM 10/19/2021    1:21 PM 10/08/2020    9:34 AM  Depression screen PHQ 2/9  Decreased Interest 3 0 0 0 1  Down, Depressed, Hopeless 1 0 0 0 1  PHQ - 2 Score 4 0 0 0 2  Altered sleeping 2    2  Tired, decreased energy 2    1  Change in appetite 2    0  Feeling bad or failure about yourself  1    1  Trouble concentrating 1    0  Moving slowly or fidgety/restless 0    0  Suicidal thoughts 0    0  PHQ-9 Score 12    6  Difficult doing work/chores     Somewhat difficult       Objective:  BP 132/78 (BP Location: Right Arm, Patient Position: Sitting)    Pulse 79   Ht 5' 9 (1.753 m)   Wt 165 lb (74.8 kg)   SpO2 96%   BMI 24.37 kg/m   Wt Readings from Last 3 Encounters:  11/15/23 165 lb (74.8 kg)  10/24/23 161 lb 12.8 oz (73.4 kg)  06/29/23 160 lb 12.8 oz (72.9 kg)   BP Readings from Last 3 Encounters:  11/15/23 132/78  10/24/23 130/80  06/29/23 124/78    General appearance: alert, no distress, WD/WN, Caucasian male Skin: left upper back linear surgical scar from prior lipoma excision.  There is a patch of erythema from excoriation.  Otherwise skin unremarkable HEENT: normocephalic, conjunctiva/corneas normal, sclerae anicteric, PERRLA, EOMi Neck: supple, no lymphadenopathy, no thyromegaly, no masses, normal ROM, no bruits Chest: non tender, normal shape and expansion Heart: RRR, normal S1, S2, no murmurs Lungs: vertical CABG scar with keloid, CTA bilaterally, no wheezes, rhonchi, or rales Abdomen: +bs, soft, non tender, non distended, no masses, no hepatomegaly, no splenomegaly, no bruits Back: non tender, normal ROM, no scoliosis Musculoskeletal: upper extremities non tender, no obvious deformity, normal ROM throughout, lower extremities non tender, no obvious deformity, normal ROM throughout Extremities: no edema, no cyanosis, no clubbing Pulses: 2+ symmetric, upper and lower extremities, normal cap refill Neurological: alert, oriented x 3, CN2-12 intact, strength normal upper extremities and lower extremities, sensation normal throughout, DTRs 2+ throughout, no cerebellar signs, gait normal Psychiatric: normal affect, behavior normal, pleasant  GU/rectal - deferred, declined   Assessment and Plan :   Encounter Diagnoses  Name Primary?   Encounter for health maintenance examination in adult Yes   Former smoker    Vapes nicotine  containing substance    Screening for lung cancer    Anxiety and depression    Benign prostatic hyperplasia, unspecified whether lower urinary tract symptoms present    Coronary artery disease  involving native coronary artery of native heart without angina pectoris    Mixed dyslipidemia    Low testosterone  in male    Impaired fasting blood sugar    Hypogonadism in male    Gastroesophageal  reflux disease, unspecified whether esophagitis present    Essential hypertension    Erectile dysfunction, unspecified erectile dysfunction type    OSA on CPAP    S/P CABG x 3    Vaccine counseling    Screening for prostate cancer    Abnormal urine odor       This visit was a preventative care visit, also known as wellness visit or routine physical.   Topics typically include healthy lifestyle, diet, exercise, preventative care, vaccinations, sick and well care, proper use of emergency dept and after hours care, as well as other concerns.     Recommendations: Continue to return yearly for your annual wellness and preventative care visits.  This gives us  a chance to discuss healthy lifestyle, exercise, vaccinations, review your chart record, and perform screenings where appropriate.  I recommend you see your eye doctor yearly for routine vision care.  I recommend you see your dentist yearly for routine dental care including hygiene visits twice yearly.   Vaccination recommendations were reviewed Immunization History  Administered Date(s) Administered   Influenza Split 01/28/2015   Influenza,inj,Quad PF,6+ Mos 12/17/2015   Influenza-Unspecified 01/28/2015, 02/12/2017   Pneumococcal Polysaccharide-23 12/17/2015, 07/09/2020   Tdap 04/01/2015   I recommend a yearly flu shot in the fall I recommend Shingrix vaccine I recommend updated covid booster when available   Screening for cancer: Colon cancer screening: I reviewed your Cologuard 09/2023, negative  We discussed PSA, prostate exam, and prostate cancer screening risks/benefits.     Skin cancer screening: Check your skin regularly for new changes, growing lesions, or other lesions of concern Come in for evaluation if you have  skin lesions of concern.  Lung cancer screening: If you have a greater than 20 pack year history of tobacco use, then you may qualify for lung cancer screening with a chest CT scan.   Please call your insurance company to inquire about coverage for this test.  We currently don't have screenings for other cancers besides breast, cervical, colon, and lung cancers.  If you have a strong family history of cancer or have other cancer screening concerns, please let me know.    Bone health: Get at least 150 minutes of aerobic exercise weekly Get weight bearing exercise at least once weekly Bone density test:  A bone density test is an imaging test that uses a type of X-ray to measure the amount of calcium  and other minerals in your bones. The test may be used to diagnose or screen you for a condition that causes weak or thin bones (osteoporosis), predict your risk for a broken bone (fracture), or determine how well your osteoporosis treatment is working. The bone density test is recommended for females 65 and older, or females or males <65 if certain risk factors such as thyroid  disease, long term use of steroids such as for asthma or rheumatological issues, vitamin D  deficiency, estrogen deficiency, family history of osteoporosis, self or family history of fragility fracture in first degree relative.    Heart health: Get at least 150 minutes of aerobic exercise weekly Limit alcohol It is important to maintain a healthy blood pressure and healthy cholesterol numbers   Medical care options: I recommend you continue to seek care here first for routine care.  We try really hard to have available appointments Monday through Friday daytime hours for sick visits, acute visits, and physicals.  Urgent care should be used for after hours and weekends for significant issues that cannot wait till the next  day.  The emergency department should be used for significant potentially life-threatening emergencies.   The emergency department is expensive, can often have long wait times for less significant concerns, so try to utilize primary care, urgent care, or telemedicine when possible to avoid unnecessary trips to the emergency department.  Virtual visits and telemedicine have been introduced since the pandemic started in 2020, and can be convenient ways to receive medical care.  We offer virtual appointments as well to assist you in a variety of options to seek medical care.    Separate significant issues discussed:  OSA - uses CPAP some, has some noncompliance  Still vaping. Advised cessation.    Urine odor - protein in urine today but no obvious infection.  Hydrate well with water daily.  Lung cancer screening - updated chest CT order today  Mixed dyslipidemia-labs today, advised to continue Zetia , Crestor , vascepa    Low testosterone  - compliant with testosterone  gel 3 total pumps daily  ED, BPH - Continue Cialis   Hypertension-he hasn't taken any BP medication in several weeks.  Restart at least 1/2 tablet of Lisinopril  HCT 20/25mg  daily.  He is going to hold off on Amlodipine  for now.  GERD- advised Protonix  prn  Low testosterone , hypogonadism - continue testosterone  topical.  Currently using 3 pumps daily.  Depression and anxiety - restart wellbutrin .   Yussuf Sawyers was seen today for annual exam.  Diagnoses and all orders for this visit:  Encounter for health maintenance examination in adult -     Testosterone  -     PSA -     Lipid panel -     CBC -     Comprehensive metabolic panel with GFR -     POCT Urinalysis DIP (Proadvantage Device) -     Hemoglobin A1c  Former smoker -     CT CHEST LUNG CA SCREEN LOW DOSE W/O CM; Future  Vapes nicotine  containing substance -     CT CHEST LUNG CA SCREEN LOW DOSE W/O CM; Future  Screening for lung cancer -     CT CHEST LUNG CA SCREEN LOW DOSE W/O CM; Future  Anxiety and depression  Benign prostatic hyperplasia, unspecified  whether lower urinary tract symptoms present -     PSA  Coronary artery disease involving native coronary artery of native heart without angina pectoris  Mixed dyslipidemia -     Lipid panel  Low testosterone  in male -     Testosterone  -     Hemoglobin A1c  Impaired fasting blood sugar  Hypogonadism in male -     Testosterone   Gastroesophageal reflux disease, unspecified whether esophagitis present  Essential hypertension  Erectile dysfunction, unspecified erectile dysfunction type  OSA on CPAP  S/P CABG x 3  Vaccine counseling  Screening for prostate cancer -     PSA  Abnormal urine odor -     POCT Urinalysis DIP (Proadvantage Device)  Other orders -     aspirin  EC (ASPIRIN  ADULT LOW DOSE) 81 MG tablet; Take 1 tablet (81 mg total) by mouth daily. Swallow whole. -     rosuvastatin  (CRESTOR ) 20 MG tablet; Take 1 tablet (20 mg total) by mouth daily. -     tadalafil  (CIALIS ) 20 MG tablet; Take 0.5-1 tablets (10-20 mg total) by mouth every other day as needed for erectile dysfunction. -     VASCEPA  1 g capsule; Take 2 capsules (2 g total) by mouth 2 (two) times daily. -     lisinopril -hydrochlorothiazide  (  ZESTORETIC ) 20-25 MG tablet; Take 1 tablet by mouth daily. -     buPROPion  (WELLBUTRIN  XL) 150 MG 24 hr tablet; Take 1 tablet (150 mg total) by mouth daily.    Follow-up pending labs, yearly for physical

## 2023-11-16 ENCOUNTER — Other Ambulatory Visit: Payer: Self-pay | Admitting: Medical

## 2023-11-16 ENCOUNTER — Ambulatory Visit: Payer: Self-pay | Admitting: Medical

## 2023-11-16 LAB — COMPREHENSIVE METABOLIC PANEL WITH GFR
ALT: 25 IU/L (ref 0–44)
AST: 20 IU/L (ref 0–40)
Albumin: 4.7 g/dL (ref 3.8–4.9)
Alkaline Phosphatase: 76 IU/L (ref 44–121)
BUN/Creatinine Ratio: 16 (ref 9–20)
BUN: 15 mg/dL (ref 6–24)
Bilirubin Total: 0.8 mg/dL (ref 0.0–1.2)
CO2: 23 mmol/L (ref 20–29)
Calcium: 9.3 mg/dL (ref 8.7–10.2)
Chloride: 100 mmol/L (ref 96–106)
Creatinine, Ser: 0.91 mg/dL (ref 0.76–1.27)
Globulin, Total: 2.5 g/dL (ref 1.5–4.5)
Glucose: 85 mg/dL (ref 70–99)
Potassium: 4 mmol/L (ref 3.5–5.2)
Sodium: 139 mmol/L (ref 134–144)
Total Protein: 7.2 g/dL (ref 6.0–8.5)
eGFR: 100 mL/min/1.73 (ref >59)

## 2023-11-16 LAB — CBC
Hematocrit: 55.5 % — ABNORMAL HIGH (ref 37.5–51.0)
Hemoglobin: 18.2 g/dL — ABNORMAL HIGH (ref 13.0–17.7)
MCH: 29.7 pg (ref 26.6–33.0)
MCHC: 32.8 g/dL (ref 31.5–35.7)
MCV: 91 fL (ref 79–97)
Platelets: 307 x10E3/uL (ref 150–450)
RBC: 6.12 x10E6/uL — ABNORMAL HIGH (ref 4.14–5.80)
RDW: 13.8 % (ref 11.6–15.4)
WBC: 8.3 x10E3/uL (ref 3.4–10.8)

## 2023-11-16 LAB — HEMOGLOBIN A1C
Est. average glucose Bld gHb Est-mCnc: 114 mg/dL
Hgb A1c MFr Bld: 5.6 % (ref 4.8–5.6)

## 2023-11-16 LAB — LIPID PANEL
Cholesterol, Total: 239 mg/dL — AB (ref 100–199)
HDL: 51 mg/dL (ref >39)
LDL CALC COMMENT:: 4.7 ratio (ref 0.0–5.0)
LDL Chol Calc (NIH): 140 mg/dL — AB (ref 0–99)
Triglycerides: 264 mg/dL — AB (ref 0–149)
VLDL Cholesterol Cal: 48 mg/dL — AB (ref 5–40)

## 2023-11-16 LAB — PSA: Prostate Specific Ag, Serum: 1.2 ng/mL (ref 0.0–4.0)

## 2023-11-16 LAB — TESTOSTERONE: Testosterone: 364 ng/dL (ref 264–916)

## 2023-11-16 MED ORDER — TESTOSTERONE 1.62 % TD GEL
TRANSDERMAL | 1 refills | Status: AC
Start: 2023-11-16 — End: ?

## 2023-11-16 MED ORDER — EZETIMIBE 10 MG PO TABS: 10.0000 mg | ORAL_TABLET | Freq: Every day | ORAL | 2 refills | Status: AC

## 2023-11-16 MED ORDER — PANTOPRAZOLE SODIUM 40 MG PO TBEC: 40.0000 mg | DELAYED_RELEASE_TABLET | Freq: Every day | ORAL | 2 refills | Status: DC

## 2023-11-16 NOTE — Progress Notes (Signed)
 Schedule III month follow-up.  Results through MyChart

## 2023-11-22 ENCOUNTER — Ambulatory Visit
Admission: RE | Admit: 2023-11-22 | Discharge: 2023-11-22 | Disposition: A | Discharge status: Home / Self Care | Source: Ambulatory Visit | Attending: Medical | Admitting: Medical

## 2023-11-22 DIAGNOSIS — Z122 Encounter for screening for malignant neoplasm of respiratory organs: Secondary | ICD-10-CM

## 2023-11-22 DIAGNOSIS — Z87891 Personal history of nicotine dependence: Secondary | ICD-10-CM

## 2023-11-22 DIAGNOSIS — Z720 Tobacco use: Secondary | ICD-10-CM

## 2023-12-04 NOTE — Progress Notes (Signed)
 Results thru my chart

## 2024-02-12 NOTE — Progress Notes (Unsigned)
 Cardiology Office Note:    Date:  02/13/2024   ID:  Donald Oliver, DOB [birth year], MRN [id number]  PCP:  Bulah Alm RAMAN, PA-C  Cardiologist:  Redell Leiter, MD    Referring MD: Bulah Alm RAMAN, PA-C    ASSESSMENT:    1. Coronary artery disease involving native coronary artery of native heart with unstable angina pectoris (HCC)   2. S/P CABG x 3   3. Essential hypertension   4. Mixed dyslipidemia    PLAN:    In order of problems listed above:  Stable after previous CABG he is having no angina continue medical treatment including aspirin  combined lipid-lowering with Zetia  rosuvastatin  and Vascepa  upcoming labs with his PCP I asked him to purchase an ambulatory blood pressure device check and record blood pressures Continue ACE inhibitor thiazide diuretic for hypertension   Next appointment: 1 year   Medication Adjustments/Labs and Tests Ordered: Current medicines are reviewed at length with the patient today.  Concerns regarding medicines are outlined above.  Orders Placed This Encounter  Procedures   EKG 12-Lead   No orders of the defined types were placed in this encounter.    History of Present Illness:    Donald Oliver is a 53 y.o. male with a hx of CAD with CABG 2017 hypertensive hyperlipidemia last seen 01/07/2023.  Profile 2 months ago markedly worsened previous LDL was 40 it was 140 previous total 190 was 239 and non-HDL cholesterol 188.  He was noted to have hepatic steatosis lung cancer screening CT  Compliance with diet, lifestyle and medications: Yes  This is a routine post CABG follow-up Overall is done well but was off his lipid-lowering treatment when labs were done and has arrangements to be performed with her PCP and I will add an APO B to the labs to be done at his office goal less than 40-45 a better marker of atherogenic lipid particles and LDL No angina edema shortness of breath palpitation or syncope Testosterone  was decreased he finds  himself fatigued He inquires about having a functional test like a stress protocol and I told him I do not think is required at this time Past Medical History:  Diagnosis Date   Abnormal CBC 10/08/2020   Anxiety 10/08/2020   Anxiety and depression 07/09/2020   Benign prostatic hyperplasia 09/05/2018   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 Lucas with LIMA to LAD, free RIMA to Diag and SVG to RCA     Eczema 09/05/2018   Encounter for health maintenance examination in adult 02/25/2015   Erectile dysfunction    Essential hypertension    Family history of premature CAD    Fatty liver disease, nonalcoholic 05/24/2018   Former smoker 08/29/2016   Gastroesophageal reflux disease 02/25/2015   High risk medication use 07/09/2020   History of substance abuse (HCC)    Pt had abused amphetamines : quit in 2005   Hyperlipidemia    Hypogonadism in male 02/07/2020   Impaired fasting blood sugar 2016   Insomnia 10/08/2020   Keloid scar of skin 06/21/2016   Lipoma 07/19/2019   Lipoma of left upper extremity 11/07/2022   Low testosterone  in male 05/24/2018   Metatarsalgia of right foot 06/21/2016   Mixed dyslipidemia 09/05/2018   Onychomycosis    OSA on CPAP 10/31/2016   S/P CABG  x 3 06/02/2015   Screening for prostate cancer 07/09/2020   Shoulder mass 11/07/2022   Sleep apnea    Tinnitus of both ears 07/09/2020   Vaccine counseling 07/09/2020   Vapes nicotine  containing substance 10/19/2021    Current Medications: Current Meds  Medication Sig   aspirin  EC (ASPIRIN  ADULT LOW DOSE) 81 MG tablet Take 1 tablet (81 mg total) by mouth daily. Swallow whole.   ezetimibe  (ZETIA ) 10 MG tablet Take 1 tablet (10 mg total) by mouth daily.   lisinopril -hydrochlorothiazide  (ZESTORETIC ) 20-25 MG tablet Take 1 tablet by mouth daily.    nitroGLYCERIN  (NITROSTAT ) 0.4 MG SL tablet Place 0.4 mg under the tongue every 5 (five) minutes as needed for chest pain.   rosuvastatin  (CRESTOR ) 20 MG tablet Take 1 tablet (20 mg total) by mouth daily.   tadalafil  (CIALIS ) 20 MG tablet Take 0.5-1 tablets (10-20 mg total) by mouth every other day as needed for erectile dysfunction.   tadalafil  (CIALIS ) 5 MG tablet Take 5 mg by mouth daily.   Testosterone  1.62 % GEL APPLY 2 PUMPS TO THE SKIN DAILY AS DIRECTED   VASCEPA  1 g capsule Take 2 capsules (2 g total) by mouth 2 (two) times daily.      EKGs/Labs/Other Studies Reviewed:    The following studies were reviewed today:     EKG Interpretation Date/Time:  Tuesday February 13 2024 09:44:42 EST Ventricular Rate:  95 PR Interval:  158 QRS Duration:  104 QT Interval:  366 QTC Calculation: 459 R Axis:   117  Text Interpretation: Normal sinus rhythm Possible Left atrial enlargement Left posterior fascicular block When compared with ECG of 07-Feb-2023 09:47, No significant change was found Confirmed by Monetta Rogue ([id number]) on 02/13/2024 9:58:16 AM   Recent Labs: 11/15/2023: ALT 25; BUN 15; Creatinine, Ser 0.91; Hemoglobin 18.2; Platelets 307; Potassium 4.0; Sodium 139  Recent Lipid Panel    Component Value Date/Time   CHOL 239 (H) 11/15/2023 1454   TRIG 264 (H) 11/15/2023 1454   HDL 51 11/15/2023 1454   CHOLHDL 4.7 11/15/2023 1454   CHOLHDL 5.6 (H) 10/31/2016 1500   VLDL 74 (H) 10/31/2016 1500   LDLCALC 140 (H) 11/15/2023 1454    Physical Exam:    VS:  BP (!) 148/80   Pulse 95   Ht 5' 10 (1.778 m)   Wt 166 lb (75.3 kg)   SpO2 98%   BMI 23.82 kg/m     Wt Readings from Last 3 Encounters:  02/13/24 166 lb (75.3 kg)  11/15/23 165 lb (74.8 kg)  10/24/23 161 lb 12.8 oz (73.4 kg)     GEN:  Well nourished, well developed in no acute distress HEENT: Normal NECK: No JVD; No carotid bruits LYMPHATICS: No lymphadenopathy CARDIAC: RRR, no murmurs, rubs, gallops RESPIRATORY:   Clear to auscultation without rales, wheezing or rhonchi  ABDOMEN: Soft, non-tender, non-distended MUSCULOSKELETAL:  No edema; No deformity  SKIN: Warm and dry NEUROLOGIC:  Alert and oriented x 3 PSYCHIATRIC:  Normal affect    Signed, Rogue Monetta, MD  02/13/2024 10:32 AM    [facility]

## 2024-02-13 ENCOUNTER — Encounter: Payer: Self-pay | Admitting: Cardiology

## 2024-02-13 ENCOUNTER — Ambulatory Visit: Attending: Cardiology | Admitting: Cardiology

## 2024-02-13 VITALS — BP 148/80 | HR 95 | Ht 70.0 in | Wt 166.0 lb

## 2024-02-13 DIAGNOSIS — I10 Essential (primary) hypertension: Secondary | ICD-10-CM

## 2024-02-13 DIAGNOSIS — I25110 Atherosclerotic heart disease of native coronary artery with unstable angina pectoris: Secondary | ICD-10-CM

## 2024-02-13 DIAGNOSIS — Z951 Presence of aortocoronary bypass graft: Secondary | ICD-10-CM | POA: Diagnosis not present

## 2024-02-13 DIAGNOSIS — E782 Mixed hyperlipidemia: Secondary | ICD-10-CM | POA: Diagnosis not present

## 2024-02-13 NOTE — Patient Instructions (Addendum)
 Medication Instructions:  Your physician recommends that you continue on your current medications as directed. Please refer to the Current Medication list given to you today.  *If you need a refill on your cardiac medications before your next appointment, please call your pharmacy*  Lab Work: None If you have labs (blood work) drawn today and your tests are completely normal, you will receive your results only by: MyChart Message (if you have MyChart) OR A paper copy in the mail If you have any lab test that is abnormal or we need to change your treatment, we will call you to review the results.  Testing/Procedures: None  Follow-Up: At [facility], you and your health needs are our priority.  As part of our continuing mission to provide you with exceptional heart care, our providers are all part of one team.  This team includes your primary Cardiologist (physician) and Advanced Practice Providers or APPs (Physician Assistants and Nurse Practitioners) who all work together to provide you with the care you need, when you need it.  Your next appointment:   1 year(s)  Provider:   Redell Leiter, MD    We recommend signing up for the patient portal called MyChart.  Sign up information is provided on this After Visit Summary.  MyChart is used to connect with patients for Virtual Visits (Telemedicine).  Patients are able to view lab/test results, encounter notes, upcoming appointments, etc.  Non-urgent messages can be sent to your provider as well.   To learn more about what you can do with MyChart, go to [email or web address].   Other Instructions  Do an Apo B with your PCP's labs  Purchase an [employer] blood pressure device and check blood pressure using good technique daily                     Dr. Leiter [address] [place], [place] [address]  Blood Pressure Record Sheet To take your blood pressure, you will need a blood pressure machine. You can buy a blood pressure machine  (blood pressure monitor) at your clinic, drug store, or online. When choosing one, consider: An automatic monitor that has an arm cuff. A cuff that wraps snugly around your upper arm. You should be able to fit only one finger between your arm and the cuff. A device that stores blood pressure reading results. Do not choose a monitor that measures your blood pressure from your wrist or finger. Follow your health care provider's instructions for how to take your blood pressure. To use this form: Get one reading in the morning (a.m.) 1-2 hours after you take any medicines. Get one reading in the evening (p.m.) before supper.   Blood pressure log Date: _______________________  a.m. _____________________(1st reading) HR___________            p.m. _____________________(2nd reading) HR__________  Date: _______________________  a.m. _____________________(1st reading) HR___________            p.m. _____________________(2nd reading) HR__________  Date: _______________________  a.m. _____________________(1st reading) HR___________            p.m. _____________________(2nd reading) HR__________  Date: _______________________  a.m. _____________________(1st reading) HR___________            p.m. _____________________(2nd reading) HR__________  Date: _______________________  a.m. _____________________(1st reading) HR___________            p.m. _____________________(2nd reading) HR__________  Date: _______________________  a.m. _____________________(1st reading) HR___________            p.m.  _____________________(2nd reading) HR__________  Date: _______________________  a.m. _____________________(1st reading) HR___________            p.m. _____________________(2nd reading) HR__________   This information is not intended to replace advice given to you by your health care provider. Make sure you discuss any questions you have with your health care provider. Document Revised:  07/03/2019 Document Reviewed: 07/03/2019 Elsevier Patient Education  2021 [employer].

## 2024-04-12 ENCOUNTER — Ambulatory Visit: Payer: Self-pay | Admitting: Medical

## 2024-04-12 ENCOUNTER — Other Ambulatory Visit: Payer: Self-pay | Admitting: Medical

## 2024-04-12 NOTE — Telephone Encounter (Signed)
 This is not our patient.

## 2024-04-12 NOTE — Telephone Encounter (Signed)
 FYI Only or Action Required?: Action required by provider: Medication Request.  Patient was last seen in primary care on 11/15/2023 by Bulah Alm RAMAN, PA-C.  Called Nurse Triage reporting Back Pain.  Symptoms began several years ago.  Interventions attempted: OTC medications: Advil.  Symptoms are: gradually worsening.  Triage Disposition: See HCP Within 4 Hours (Or PCP Triage)  Patient/caregiver understands and will follow disposition?: Yes   Message from [facility] C sent at 04/12/2024  1:30 PM EST  Summary: lower back concern / rx req   Reason for Triage: The patient has called to request a prescription to help with their lower back discomfort from their PCP. The patient shares that they have a history of sciatic issues that have increased suddenly and significantly. Please contact further when possible      Reason for Disposition  [1] Pain radiates into the thigh or further down the leg AND [2] both legs  Answer Assessment - Initial Assessment Questions 1. ONSET: When did the pain begin? (e.g., minutes, hours, days)     X weeks  2. LOCATION: Where does it hurt? (upper, mid or lower back)     Lower back  3. SEVERITY: How bad is the pain?  (e.g., Scale 1-10; mild, moderate, or severe)     10/10. Pt states he only feels relief when lying down so he does not leave the bed often  4. PATTERN: Is the pain constant? (e.g., yes, no; constant, intermittent)      Comes and goes. Only when walking  5. RADIATION: Does the pain shoot into your legs or somewhere else?      Down legs, tingle  6. CAUSE:  What do you think is causing the back pain?      Sciatic  7. BACK OVERUSE:  Any recent lifting of heavy objects, strenuous work or exercise?     Denies  8. MEDICINES: What have you taken so far for the pain? (e.g., nothing, acetaminophen , NSAIDS)     Advil  9. NEUROLOGIC SYMPTOMS: Do you have any weakness, numbness, or problems with bowel/bladder control?       denies  10. OTHER SYMPTOMS: Pt denies ever, abdomen pain, burning with urination, blood in urine   Pt reports lower back pain Pt is taking OTC Advil Pt states he would prefer medication to be sent to pharmacy. Pt states Ultram  does work for him. Pt requesting medication for relief over the weekend  Pt scheduled for an in clinic visit on 01.20.26 with PCP for further evaluation and treatment Pt agrees with plan of care, will call back for any worsening symptoms  Protocols used: Back Pain-A-AH

## 2024-04-12 NOTE — Telephone Encounter (Signed)
 Attempted to contact patient x 2 --Unable to leave a voicemail due to voice mailbox being full     Message from [place] C sent at 04/12/2024  1:30 PM EST  Summary: lower back concern / rx req   Reason for Triage: The patient has called to request a prescription to help with their lower back discomfort from their PCP. The patient shares that they have a history of sciatic issues that have increased suddenly and significantly. Please contact further when possible

## 2024-04-16 ENCOUNTER — Ambulatory Visit: Admitting: Medical

## 2024-04-17 ENCOUNTER — Encounter: Payer: Self-pay | Admitting: Medical

## 2024-04-25 ENCOUNTER — Ambulatory Visit: Admitting: Medical

## 2024-04-25 ENCOUNTER — Ambulatory Visit: Payer: Self-pay

## 2024-04-25 VITALS — BP 162/92 | HR 89 | Temp 98.5°F | Wt 169.2 lb

## 2024-04-25 DIAGNOSIS — R509 Fever, unspecified: Secondary | ICD-10-CM | POA: Diagnosis not present

## 2024-04-25 DIAGNOSIS — R1032 Left lower quadrant pain: Secondary | ICD-10-CM

## 2024-04-25 DIAGNOSIS — R195 Other fecal abnormalities: Secondary | ICD-10-CM | POA: Diagnosis not present

## 2024-04-25 MED ORDER — TIZANIDINE HCL 4 MG PO TABS: 4.0000 mg | ORAL_TABLET | Freq: Every evening | ORAL | 0 refills | Status: AC | PRN

## 2024-04-25 MED ORDER — HYDROCODONE-ACETAMINOPHEN 5-325 MG PO TABS: 1.0000 | ORAL_TABLET | Freq: Two times a day (BID) | ORAL | 0 refills | Status: AC | PRN

## 2024-04-25 NOTE — Progress Notes (Signed)
 "  Name: Donald Oliver   Date of Visit: 04/25/24   CHIEF COMPLAINT:  Chief Complaint  Patient presents with   Acute Visit    Abdominal area. Hurts on left side, sweats. Pain scale around 8 or 9 not constant but when it peaks its pretty bad.        HPI:  Discussed the use of AI scribe software for clinical note transcription with the patient, who gave verbal consent to proceed.  History of Present Illness  Donald Oliver is a 54 year old male who presents with abdominal pain and dark stools.  He has been experiencing severe abdominal pain since last night, which began after shoveling snow. The pain is localized to the left lower abdomen and is described as intense, making it difficult for him to stand upright. However, the pain subsides when he is lying down.  He has been experiencing cold sweats since last night, which he associates with a possible fever, although he does not have a thermometer to confirm this. He wakes up covered in sweat but does not experience pain while lying down.  He reports having dark stools for the past two to three days, with a possible presence of blood, although he is uncertain about the exact color. No recent changes in stool color to gray or chalky.  He has been taking ibuprofen, four to six tablets a day, for his sciatica, which is not currently causing him pain. He did not take his blood pressure medication today, which he attributes to his elevated blood pressure reading.  No recent falls or trauma to the abdomen. No runny nose, sore throat, cough, nausea, or vomiting. He has not been around anyone who is currently sick.  No other aggravating or relieving factors. No other complaint.  Past Medical History:  Diagnosis Date   Abnormal CBC 10/08/2020   Anxiety 10/08/2020   Anxiety and depression 07/09/2020   Benign prostatic hyperplasia 09/05/2018   Bilateral high scrotal testes 05/23/2019   CAD (coronary artery disease), native coronary  artery 06/01/2015   Cath 3/3 80% LAD, 99% diag, 50% circ, RCA 100% with Lto R collaterals  CABG 06/02/15 Dr. Lucas with LIMA to LAD, free RIMA to Diag and SVG to RCA     Eczema 09/05/2018   Encounter for health maintenance examination in adult 02/25/2015   Erectile dysfunction    Essential hypertension    Family history of premature CAD    Fatty liver disease, nonalcoholic 05/24/2018   Former smoker 08/29/2016   Gastroesophageal reflux disease 02/25/2015   High risk medication use 07/09/2020   History of substance abuse (HCC)    Pt had abused amphetamines : quit in 2005   Hyperlipidemia    Hypogonadism in male 02/07/2020   Impaired fasting blood sugar 2016   Insomnia 10/08/2020   Keloid scar of skin 06/21/2016   Lipoma 07/19/2019   Lipoma of left upper extremity 11/07/2022   Low testosterone  in male 05/24/2018   Metatarsalgia of right foot 06/21/2016   Mixed dyslipidemia 09/05/2018   Onychomycosis    OSA on CPAP 10/31/2016   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   Screening for prostate cancer 07/09/2020   Shoulder mass 11/07/2022   Sleep apnea    Tinnitus of both ears 07/09/2020   Vaccine counseling 07/09/2020   Vapes nicotine  containing substance 10/19/2021     Current Outpatient Medications on File Prior to Visit  Medication Sig Dispense Refill   aspirin  EC (ASPIRIN  ADULT LOW  DOSE) 81 MG tablet Take 1 tablet (81 mg total) by mouth daily. Swallow whole. 90 tablet 3   ezetimibe  (ZETIA ) 10 MG tablet Take 1 tablet (10 mg total) by mouth daily. 90 tablet 2   lisinopril -hydrochlorothiazide  (ZESTORETIC ) 20-25 MG tablet Take 1 tablet by mouth daily. 90 tablet 2   nitroGLYCERIN  (NITROSTAT ) 0.4 MG SL tablet Place 0.4 mg under the tongue every 5 (five) minutes as needed for chest pain.     rosuvastatin  (CRESTOR ) 20 MG tablet Take 1 tablet (20 mg total) by mouth daily. 90 tablet 3   tadalafil  (CIALIS ) 20 MG tablet Take 0.5-1 tablets (10-20 mg total) by mouth every other day as needed for erectile  dysfunction. 30 tablet 2   tadalafil  (CIALIS ) 5 MG tablet Take 5 mg by mouth daily.     Testosterone  1.62 % GEL APPLY 2 PUMPS TO THE SKIN DAILY AS DIRECTED 150 g 1   VASCEPA  1 g capsule Take 2 capsules (2 g total) by mouth 2 (two) times daily. 120 capsule 3   No current facility-administered medications on file prior to visit.     OBJECTIVE:    BP (!) 162/92   Pulse 89   Wt 169 lb 3.2 oz (76.7 kg)   SpO2 98%   BMI 24.28 kg/m    Wt Readings from Last 3 Encounters:  04/25/24 169 lb 3.2 oz (76.7 kg)  02/13/24 166 lb (75.3 kg)  11/15/23 165 lb (74.8 kg)   BP Readings from Last 3 Encounters:  04/25/24 (!) 162/92  02/13/24 (!) 148/80  11/15/23 132/78    General appearence: alert, no distress, WD/WN,  HEENT: normocephalic, sclerae anicteric, PERRLA, EOMi, nares with some clear discharge , mild erythema, pharynx normal Oral cavity: somewhat dry MM, no lesions Neck: supple, no lymphadenopathy, no thyromegaly, no masses, no JVD  Heart: RRR, normal S1, S2, no murmurs Lungs: CTA bilaterally, no wheezes, rhonchi, or rales Abdomen: +bs, soft, tender left mid lateral abdomen, no rebound, negative rovsings and psoas, otherwise non tender, non distended, no masses, no hepatomegaly, no splenomegaly. Seems to be worse pain in left lower abdomen when standing Extremities: no edema, no cyanosis, no clubbing Pulses: 2+ symmetric, upper and lower extremities, normal cap refill Back: nontender    ASSESSMENT/PLAN:   Encounter Diagnoses  Name Primary?   Fever and chills Yes   Left lower quadrant abdominal pain    Dark stools    We discussed symptoms and concerns.  Flu and COVID-negative today.  Orthostatic vital signs without significant orthostasis.  Telemetry normal.  We discussed possible differential.  Given the dark stool and sudden onset of symptoms, labs as below.  We may need to pursue a scan as well.  We discussed that this could just be as simple as a muscle spasm and muscle  strain from where he was shoveling snow but that does not account for all the symptoms.  Advise relative rest, gentle stretching and range of motion activity as we discussed.  Can use muscle relaxer tizanidine  for spasm and strain of muscle.  Caution on sedation.  Can use pain medicine below sparingly the next few days for pain.  Caution with sedation.  Advised if much worse overnight go to the hospital  Also discussed the need to be compliant with blood pressure medicine every day as he missed his dose this morning.   Donald Oliver was seen today for acute visit.  Diagnoses and all orders for this visit:  Fever and chills -  Orthostatic vital signs -     CBC with Differential/Platelet -     Lipase -     Comprehensive metabolic panel with GFR  Left lower quadrant abdominal pain -     CBC with Differential/Platelet -     Lipase -     Comprehensive metabolic panel with GFR  Dark stools -     CBC with Differential/Platelet -     Lipase -     Comprehensive metabolic panel with GFR  Other orders -     tiZANidine  (ZANAFLEX ) 4 MG tablet; Take 1 tablet (4 mg total) by mouth at bedtime as needed for muscle spasms. -     HYDROcodone -acetaminophen  (NORCO/VICODIN) 5-325 MG tablet; Take 1 tablet by mouth 2 (two) times daily as needed for moderate pain (pain score 4-6).   Follow up pending labs   [facility] "

## 2024-04-25 NOTE — Progress Notes (Signed)
 Donald Oliver                                          MRN: [id number]   04/25/2024   The [facility] Quality Team Specialist reviewed this patient medical record for the purposes of chart review for care gap closure. The following were reviewed: chart review for care gap closure-controlling blood pressure.    [facility] Quality Team

## 2024-04-25 NOTE — Telephone Encounter (Signed)
 FYI Only or Action Required?: FYI only for provider: appointment scheduled on 01.29.26.  Patient was last seen in primary care on 11/15/2023 by Bulah Alm RAMAN, PA-C.  Called Nurse Triage reporting Abdominal Pain.  Symptoms began yesterday.  Interventions attempted: Nothing.  Symptoms are: gradually worsening.  Triage Disposition: See HCP Within 4 Hours (Or PCP Triage)  Patient/caregiver understands and will follow disposition?: Yes   Message from [place] D sent at 04/25/2024  9:56 AM EST  Reason for Triage: Fever, Pain in L lower abdomen, sweating very bad,   Reason for Disposition  [1] MILD-MODERATE pain AND [2] constant AND [3] present > 2 hours  Answer Assessment - Initial Assessment Questions 1. LOCATION: Where does it hurt?      Lower abdomen  2. RADIATION: Does the pain shoot anywhere else? (e.g., chest, back)      Denies  3. ONSET: When did the pain begin? (Minutes, hours or days ago)      Yesterday  4. SUDDEN: Gradual or sudden onset?      Sudden  5. PATTERN Does the pain come and go, or is it constant?      Comes and goes  6. SEVERITY: How bad is the pain?  (e.g., Scale 1-10; mild, moderate, or severe)     9/10       7. RECURRENT SYMPTOM: Have you ever had this type of stomach pain before? If Yes, ask: When was the last time? and What happened that time?       Denies  8. CAUSE: What do you think is causing the stomach pain? (e.g., gallstones, recent abdominal surgery)      Denies  9. RELIEVING/AGGRAVATING FACTORS: What makes it better or worse? (e.g., antacids, bending or twisting motion, bowel movement)     denies  10. OTHER SYMPTOMS: PT denies back pain, diarrhea, fever, urination pain, vomiting         Pt reports Abdominal pain and sweating Pt scheduled for a visit on  01.29.26 for further evaluation. ED precautions given Pt agrees with plan of care, will call back for any worsening symptoms  Protocols used: Abdominal Pain  - Male-A-AH

## 2024-04-26 ENCOUNTER — Ambulatory Visit
Admission: RE | Admit: 2024-04-26 | Discharge: 2024-04-26 | Disposition: A | Source: Ambulatory Visit | Attending: Medical | Admitting: Medical

## 2024-04-26 ENCOUNTER — Other Ambulatory Visit: Payer: Self-pay | Admitting: Medical

## 2024-04-26 ENCOUNTER — Ambulatory Visit: Payer: Self-pay | Admitting: Medical

## 2024-04-26 ENCOUNTER — Encounter: Payer: Self-pay | Admitting: Radiology

## 2024-04-26 DIAGNOSIS — D72829 Elevated white blood cell count, unspecified: Secondary | ICD-10-CM

## 2024-04-26 DIAGNOSIS — R1030 Lower abdominal pain, unspecified: Secondary | ICD-10-CM

## 2024-04-26 LAB — COMPREHENSIVE METABOLIC PANEL WITH GFR
ALT: 42 [IU]/L (ref 0–44)
AST: 38 [IU]/L (ref 0–40)
Albumin: 4.5 g/dL (ref 3.8–4.9)
Alkaline Phosphatase: 72 [IU]/L (ref 47–123)
BUN/Creatinine Ratio: 20 (ref 9–20)
BUN: 17 mg/dL (ref 6–24)
Bilirubin Total: 1.3 mg/dL — ABNORMAL HIGH (ref 0.0–1.2)
CO2: 24 mmol/L (ref 20–29)
Calcium: 9.3 mg/dL (ref 8.7–10.2)
Chloride: 100 mmol/L (ref 96–106)
Creatinine, Ser: 0.87 mg/dL (ref 0.76–1.27)
Globulin, Total: 2.4 g/dL (ref 1.5–4.5)
Glucose: 97 mg/dL (ref 70–99)
Potassium: 3.8 mmol/L (ref 3.5–5.2)
Sodium: 141 mmol/L (ref 134–144)
Total Protein: 6.9 g/dL (ref 6.0–8.5)
eGFR: 103 mL/min/{1.73_m2}

## 2024-04-26 LAB — CBC WITH DIFFERENTIAL/PLATELET
Basophils Absolute: 0.1 10*3/uL (ref 0.0–0.2)
Basos: 0 %
EOS (ABSOLUTE): 0.1 10*3/uL (ref 0.0–0.4)
Eos: 0 %
Hematocrit: 48.5 % (ref 37.5–51.0)
Hemoglobin: 16.4 g/dL (ref 13.0–17.7)
Immature Grans (Abs): 0.2 10*3/uL — ABNORMAL HIGH (ref 0.0–0.1)
Immature Granulocytes: 1 %
Lymphocytes Absolute: 1.6 10*3/uL (ref 0.7–3.1)
Lymphs: 8 %
MCH: 30.1 pg (ref 26.6–33.0)
MCHC: 33.8 g/dL (ref 31.5–35.7)
MCV: 89 fL (ref 79–97)
Monocytes Absolute: 2.6 10*3/uL — ABNORMAL HIGH (ref 0.1–0.9)
Monocytes: 13 %
Neutrophils Absolute: 15.2 10*3/uL — ABNORMAL HIGH (ref 1.4–7.0)
Neutrophils: 78 %
Platelets: 291 10*3/uL (ref 150–450)
RBC: 5.44 x10E6/uL (ref 4.14–5.80)
RDW: 13.2 % (ref 11.6–15.4)
WBC: 19.6 10*3/uL — ABNORMAL HIGH (ref 3.4–10.8)

## 2024-04-26 LAB — LIPASE: Lipase: 25 U/L (ref 13–78)

## 2024-04-26 MED ORDER — IOPAMIDOL (ISOVUE-300) INJECTION 61%
100.0000 mL | Freq: Once | INTRAVENOUS | Status: AC | PRN
  Administered 2024-04-26: 100 mL via INTRAVENOUS

## 2024-04-26 NOTE — Progress Notes (Signed)
 White counts are elevated suggesting possible infection, liver kidney electrolytes located.  Lipase pancreas enzyme okay.  Call and see how he is doing this morning.  If he is worse we may want to go ahead and do a stat scan of his abdomen and pelvis today  What are his symptoms this morning?

## 2024-04-27 ENCOUNTER — Ambulatory Visit: Payer: Self-pay | Admitting: Medical

## 2024-04-27 ENCOUNTER — Other Ambulatory Visit: Payer: Self-pay | Admitting: Medical

## 2024-04-27 MED ORDER — METRONIDAZOLE 500 MG PO TABS: 500.0000 mg | ORAL_TABLET | Freq: Three times a day (TID) | ORAL | 0 refills | Status: AC

## 2024-04-27 MED ORDER — SULFAMETHOXAZOLE-TRIMETHOPRIM 800-160 MG PO TABS: 1.0000 | ORAL_TABLET | Freq: Two times a day (BID) | ORAL | 0 refills | Status: AC

## 2024-04-27 MED ORDER — OXYCODONE-ACETAMINOPHEN 5-325 MG PO TABS: 1.0000 | ORAL_TABLET | Freq: Four times a day (QID) | ORAL | 0 refills | Status: AC | PRN

## 2024-04-27 NOTE — Progress Notes (Signed)
 I called patient and send my chart message of results.  I had to leave a voice message.    Of note, I didn't receive a STAT call report yesterday evening.  I was out of office, and just saw the results early this morning.
